# Patient Record
Sex: Male | Born: 1940 | Hispanic: No | Marital: Married | Smoking: Former smoker
Health system: Southern US, Community
[De-identification: ages and names within clinical notes are randomized; demographics above are authoritative.]

## PROBLEM LIST (undated history)

## (undated) DIAGNOSIS — I10 Essential (primary) hypertension: Secondary | ICD-10-CM

## (undated) DIAGNOSIS — I2510 Atherosclerotic heart disease of native coronary artery without angina pectoris: Secondary | ICD-10-CM

## (undated) DIAGNOSIS — D696 Thrombocytopenia, unspecified: Secondary | ICD-10-CM

## (undated) DIAGNOSIS — D649 Anemia, unspecified: Secondary | ICD-10-CM

## (undated) DIAGNOSIS — G709 Myoneural disorder, unspecified: Secondary | ICD-10-CM

## (undated) DIAGNOSIS — K851 Biliary acute pancreatitis without necrosis or infection: Secondary | ICD-10-CM

## (undated) DIAGNOSIS — R1310 Dysphagia, unspecified: Secondary | ICD-10-CM

## (undated) DIAGNOSIS — I639 Cerebral infarction, unspecified: Secondary | ICD-10-CM

## (undated) DIAGNOSIS — E785 Hyperlipidemia, unspecified: Secondary | ICD-10-CM

## (undated) DIAGNOSIS — N183 Chronic kidney disease, stage 3 (moderate): Secondary | ICD-10-CM

## (undated) DIAGNOSIS — Z8673 Personal history of transient ischemic attack (TIA), and cerebral infarction without residual deficits: Secondary | ICD-10-CM

## (undated) DIAGNOSIS — I2584 Coronary atherosclerosis due to calcified coronary lesion: Secondary | ICD-10-CM

## (undated) DIAGNOSIS — G9389 Other specified disorders of brain: Secondary | ICD-10-CM

## (undated) DIAGNOSIS — K7460 Unspecified cirrhosis of liver: Secondary | ICD-10-CM

## (undated) DIAGNOSIS — K859 Acute pancreatitis without necrosis or infection, unspecified: Secondary | ICD-10-CM

## (undated) DIAGNOSIS — I700 Atherosclerosis of aorta: Secondary | ICD-10-CM

## (undated) DIAGNOSIS — I209 Angina pectoris, unspecified: Secondary | ICD-10-CM

---

## 2002-05-07 ENCOUNTER — Encounter: Admission: RE | Admit: 2002-05-07 | Discharge: 2002-08-05 | Payer: Self-pay | Admitting: Family Medicine

## 2002-08-18 ENCOUNTER — Encounter: Payer: Self-pay | Admitting: Neurosurgery

## 2002-08-18 ENCOUNTER — Encounter: Admission: RE | Admit: 2002-08-18 | Discharge: 2002-08-18 | Payer: Self-pay | Admitting: Neurosurgery

## 2002-09-01 ENCOUNTER — Encounter: Payer: Self-pay | Admitting: Neurosurgery

## 2002-09-01 ENCOUNTER — Encounter: Admission: RE | Admit: 2002-09-01 | Discharge: 2002-09-01 | Payer: Self-pay | Admitting: Neurosurgery

## 2002-10-01 ENCOUNTER — Encounter: Payer: Self-pay | Admitting: Gastroenterology

## 2002-10-01 ENCOUNTER — Ambulatory Visit (HOSPITAL_COMMUNITY): Admission: RE | Admit: 2002-10-01 | Discharge: 2002-10-01 | Payer: Self-pay | Admitting: Gastroenterology

## 2008-04-03 ENCOUNTER — Emergency Department (HOSPITAL_COMMUNITY): Admission: EM | Admit: 2008-04-03 | Discharge: 2008-04-03 | Payer: Self-pay | Admitting: Emergency Medicine

## 2010-11-09 ENCOUNTER — Emergency Department (HOSPITAL_COMMUNITY)
Admission: EM | Admit: 2010-11-09 | Discharge: 2010-11-09 | Payer: Self-pay | Source: Home / Self Care | Admitting: Emergency Medicine

## 2011-02-05 LAB — CBC
HCT: 38.8 % — ABNORMAL LOW (ref 39.0–52.0)
Hemoglobin: 13.4 g/dL (ref 13.0–17.0)
MCH: 30 pg (ref 26.0–34.0)
MCHC: 34.5 g/dL (ref 30.0–36.0)
MCV: 87 fL (ref 78.0–100.0)
Platelets: 149 10*3/uL — ABNORMAL LOW (ref 150–400)
RBC: 4.46 MIL/uL (ref 4.22–5.81)
RDW: 13 % (ref 11.5–15.5)
WBC: 9.8 10*3/uL (ref 4.0–10.5)

## 2011-02-05 LAB — BASIC METABOLIC PANEL
BUN: 21 mg/dL (ref 6–23)
CO2: 27 mEq/L (ref 19–32)
Calcium: 9.4 mg/dL (ref 8.4–10.5)
Chloride: 102 mEq/L (ref 96–112)
Creatinine, Ser: 1.34 mg/dL (ref 0.4–1.5)
GFR calc Af Amer: >60 mL/min (ref >60)
GFR calc non Af Amer: 53 mL/min — ABNORMAL LOW (ref >60)
Glucose, Bld: 198 mg/dL — ABNORMAL HIGH (ref 70–99)
Potassium: 4.1 mEq/L (ref 3.5–5.1)
Sodium: 137 mEq/L (ref 135–145)

## 2011-02-05 LAB — DIFFERENTIAL
Basophils Absolute: 0.1 10*3/uL (ref 0.0–0.1)
Basophils Relative: 1 % (ref 0–1)
Eosinophils Absolute: 0.4 10*3/uL (ref 0.0–0.7)
Monocytes Relative: 9 % (ref 3–12)
Neutro Abs: 5.4 10*3/uL (ref 1.7–7.7)
Neutrophils Relative %: 55 % (ref 43–77)

## 2012-05-21 ENCOUNTER — Inpatient Hospital Stay (HOSPITAL_COMMUNITY)
Admission: EM | Admit: 2012-05-21 | Discharge: 2012-06-01 | DRG: 438 | Disposition: A | Discharge status: Home / Self Care | Payer: Medicare Other | Attending: Family Medicine | Admitting: Family Medicine

## 2012-05-21 ENCOUNTER — Encounter (HOSPITAL_COMMUNITY): Payer: Self-pay | Admitting: Emergency Medicine

## 2012-05-21 DIAGNOSIS — I10 Essential (primary) hypertension: Secondary | ICD-10-CM

## 2012-05-21 DIAGNOSIS — R0989 Other specified symptoms and signs involving the circulatory and respiratory systems: Secondary | ICD-10-CM | POA: Diagnosis present

## 2012-05-21 DIAGNOSIS — K8050 Calculus of bile duct without cholangitis or cholecystitis without obstruction: Secondary | ICD-10-CM

## 2012-05-21 DIAGNOSIS — K7460 Unspecified cirrhosis of liver: Secondary | ICD-10-CM | POA: Diagnosis present

## 2012-05-21 DIAGNOSIS — D649 Anemia, unspecified: Secondary | ICD-10-CM

## 2012-05-21 DIAGNOSIS — B1920 Unspecified viral hepatitis C without hepatic coma: Secondary | ICD-10-CM | POA: Diagnosis present

## 2012-05-21 DIAGNOSIS — E871 Hypo-osmolality and hyponatremia: Secondary | ICD-10-CM | POA: Diagnosis present

## 2012-05-21 DIAGNOSIS — I519 Heart disease, unspecified: Secondary | ICD-10-CM | POA: Diagnosis present

## 2012-05-21 DIAGNOSIS — A419 Sepsis, unspecified organism: Secondary | ICD-10-CM

## 2012-05-21 DIAGNOSIS — D696 Thrombocytopenia, unspecified: Secondary | ICD-10-CM | POA: Diagnosis present

## 2012-05-21 DIAGNOSIS — K859 Acute pancreatitis without necrosis or infection, unspecified: Principal | ICD-10-CM | POA: Diagnosis present

## 2012-05-21 DIAGNOSIS — Z01810 Encounter for preprocedural cardiovascular examination: Secondary | ICD-10-CM

## 2012-05-21 DIAGNOSIS — A4150 Gram-negative sepsis, unspecified: Secondary | ICD-10-CM | POA: Diagnosis present

## 2012-05-21 DIAGNOSIS — N179 Acute kidney failure, unspecified: Secondary | ICD-10-CM | POA: Diagnosis present

## 2012-05-21 DIAGNOSIS — R197 Diarrhea, unspecified: Secondary | ICD-10-CM | POA: Diagnosis present

## 2012-05-21 DIAGNOSIS — E119 Type 2 diabetes mellitus without complications: Secondary | ICD-10-CM | POA: Diagnosis present

## 2012-05-21 DIAGNOSIS — I5189 Other ill-defined heart diseases: Secondary | ICD-10-CM | POA: Diagnosis present

## 2012-05-21 DIAGNOSIS — C220 Liver cell carcinoma: Secondary | ICD-10-CM

## 2012-05-21 DIAGNOSIS — R112 Nausea with vomiting, unspecified: Secondary | ICD-10-CM | POA: Diagnosis present

## 2012-05-21 DIAGNOSIS — K8040 Calculus of bile duct with cholecystitis, unspecified, without obstruction: Secondary | ICD-10-CM | POA: Diagnosis present

## 2012-05-21 HISTORY — DX: Essential (primary) hypertension: I10

## 2012-05-21 HISTORY — DX: Angina pectoris, unspecified: I20.9

## 2012-05-21 HISTORY — DX: Myoneural disorder, unspecified: G70.9

## 2012-05-21 HISTORY — DX: Hyperlipidemia, unspecified: E78.5

## 2012-05-21 HISTORY — DX: Dysphagia, unspecified: R13.10

## 2012-05-21 NOTE — ED Notes (Signed)
QMV:[id number]<NG> Expected date:05/21/12<BR> Expected time:11:28 PM<BR> Means of arrival:Ambulance<BR> Comments:<BR> N/v

## 2012-05-22 ENCOUNTER — Emergency Department (HOSPITAL_COMMUNITY): Payer: Medicare Other

## 2012-05-22 ENCOUNTER — Encounter (HOSPITAL_COMMUNITY)
Admission: EM | Disposition: A | Discharge status: Home / Self Care | Payer: Self-pay | Source: Home / Self Care | Attending: Internal Medicine

## 2012-05-22 ENCOUNTER — Inpatient Hospital Stay (HOSPITAL_COMMUNITY): Payer: Medicare Other

## 2012-05-22 ENCOUNTER — Encounter (HOSPITAL_COMMUNITY): Payer: Self-pay | Admitting: Internal Medicine

## 2012-05-22 DIAGNOSIS — R509 Fever, unspecified: Secondary | ICD-10-CM | POA: Insufficient documentation

## 2012-05-22 DIAGNOSIS — K859 Acute pancreatitis without necrosis or infection, unspecified: Secondary | ICD-10-CM

## 2012-05-22 DIAGNOSIS — E876 Hypokalemia: Secondary | ICD-10-CM

## 2012-05-22 DIAGNOSIS — N179 Acute kidney failure, unspecified: Secondary | ICD-10-CM

## 2012-05-22 DIAGNOSIS — R112 Nausea with vomiting, unspecified: Secondary | ICD-10-CM | POA: Insufficient documentation

## 2012-05-22 DIAGNOSIS — R6883 Chills (without fever): Secondary | ICD-10-CM | POA: Insufficient documentation

## 2012-05-22 DIAGNOSIS — K8001 Calculus of gallbladder with acute cholecystitis with obstruction: Secondary | ICD-10-CM

## 2012-05-22 DIAGNOSIS — I10 Essential (primary) hypertension: Secondary | ICD-10-CM | POA: Diagnosis present

## 2012-05-22 DIAGNOSIS — R197 Diarrhea, unspecified: Secondary | ICD-10-CM | POA: Insufficient documentation

## 2012-05-22 DIAGNOSIS — D696 Thrombocytopenia, unspecified: Secondary | ICD-10-CM | POA: Diagnosis present

## 2012-05-22 DIAGNOSIS — E871 Hypo-osmolality and hyponatremia: Secondary | ICD-10-CM | POA: Diagnosis present

## 2012-05-22 HISTORY — DX: Acute pancreatitis without necrosis or infection, unspecified: K85.90

## 2012-05-22 HISTORY — PX: ERCP: SHX5425

## 2012-05-22 LAB — URINE MICROSCOPIC-ADD ON

## 2012-05-22 LAB — URINALYSIS, ROUTINE W REFLEX MICROSCOPIC
Nitrite: POSITIVE — AB
Protein, ur: 100 mg/dL — AB
Specific Gravity, Urine: 1.019 (ref 1.005–1.030)
Urobilinogen, UA: 1 mg/dL (ref 0.0–1.0)

## 2012-05-22 LAB — COMPREHENSIVE METABOLIC PANEL
ALT: 277 U/L — ABNORMAL HIGH (ref 0–53)
Albumin: 3.3 g/dL — ABNORMAL LOW (ref 3.5–5.2)
Alkaline Phosphatase: 191 U/L — ABNORMAL HIGH (ref 39–117)
Chloride: 97 mEq/L (ref 96–112)
Glucose, Bld: 265 mg/dL — ABNORMAL HIGH (ref 70–99)
Potassium: 4.4 mEq/L (ref 3.5–5.1)
Sodium: 133 mEq/L — ABNORMAL LOW (ref 135–145)
Total Bilirubin: 4.3 mg/dL — ABNORMAL HIGH (ref 0.3–1.2)
Total Protein: 7.7 g/dL (ref 6.0–8.3)

## 2012-05-22 LAB — CBC WITH DIFFERENTIAL/PLATELET
Basophils Relative: 0 % (ref 0–1)
Eosinophils Absolute: 0 10*3/uL (ref 0.0–0.7)
Hemoglobin: 12.6 g/dL — ABNORMAL LOW (ref 13.0–17.0)
Lymphs Abs: 1.1 10*3/uL (ref 0.7–4.0)
MCH: 30.4 pg (ref 26.0–34.0)
Neutro Abs: 3.7 10*3/uL (ref 1.7–7.7)
Neutrophils Relative %: 77 % (ref 43–77)
Platelets: 118 10*3/uL — ABNORMAL LOW (ref 150–400)
RBC: 4.14 MIL/uL — ABNORMAL LOW (ref 4.22–5.81)

## 2012-05-22 LAB — GLUCOSE, CAPILLARY
Glucose-Capillary: 120 mg/dL — ABNORMAL HIGH (ref 70–99)
Glucose-Capillary: 133 mg/dL — ABNORMAL HIGH (ref 70–99)
Glucose-Capillary: 239 mg/dL — ABNORMAL HIGH (ref 70–99)
Glucose-Capillary: 69 mg/dL — ABNORMAL LOW (ref 70–99)

## 2012-05-22 SURGERY — ERCP, WITH INTERVENTION IF INDICATED
Anesthesia: Moderate Sedation

## 2012-05-22 MED ORDER — MIDAZOLAM HCL 10 MG/2ML IJ SOLN
INTRAMUSCULAR | Status: DC | PRN
  Administered 2012-05-22 (×2): 1 mg via INTRAVENOUS
  Administered 2012-05-22: 2 mg via INTRAVENOUS
  Administered 2012-05-22: 1 mg via INTRAVENOUS

## 2012-05-22 MED ORDER — SODIUM CHLORIDE 0.9 % IV SOLN
INTRAVENOUS | Status: DC
  Administered 2012-05-22 – 2012-05-23 (×4): via INTRAVENOUS

## 2012-05-22 MED ORDER — ONDANSETRON HCL 4 MG/2ML IJ SOLN
4.0000 mg | Freq: Once | INTRAMUSCULAR | Status: AC
  Administered 2012-05-22: 4 mg via INTRAVENOUS
  Filled 2012-05-22: qty 2

## 2012-05-22 MED ORDER — INSULIN ASPART 100 UNIT/ML ~~LOC~~ SOLN
0.0000 [IU] | Freq: Three times a day (TID) | SUBCUTANEOUS | Status: DC
  Administered 2012-05-22 – 2012-05-24 (×2): 2 [IU] via SUBCUTANEOUS
  Administered 2012-05-24: 3 [IU] via SUBCUTANEOUS
  Administered 2012-05-25: 5 [IU] via SUBCUTANEOUS
  Administered 2012-05-25: 3 [IU] via SUBCUTANEOUS
  Administered 2012-05-26 – 2012-05-29 (×3): 2 [IU] via SUBCUTANEOUS
  Administered 2012-05-30 (×2): 3 [IU] via SUBCUTANEOUS
  Administered 2012-05-31: 5 [IU] via SUBCUTANEOUS
  Administered 2012-05-31 (×2): 2 [IU] via SUBCUTANEOUS

## 2012-05-22 MED ORDER — INSULIN ASPART 100 UNIT/ML ~~LOC~~ SOLN: 0.0000 [IU] | Freq: Every day | SUBCUTANEOUS | Status: DC

## 2012-05-22 MED ORDER — EPINEPHRINE HCL 0.1 MG/ML IJ SOLN
INTRAMUSCULAR | Status: AC
  Filled 2012-05-22: qty 10

## 2012-05-22 MED ORDER — ACETAMINOPHEN 325 MG PO TABS
650.0000 mg | ORAL_TABLET | Freq: Once | ORAL | Status: AC
  Administered 2012-05-22: 650 mg via ORAL
  Filled 2012-05-22: qty 2

## 2012-05-22 MED ORDER — FENTANYL CITRATE 0.05 MG/ML IJ SOLN
50.0000 ug | Freq: Once | INTRAMUSCULAR | Status: AC
  Administered 2012-05-22: 50 ug via INTRAVENOUS
  Filled 2012-05-22: qty 2

## 2012-05-22 MED ORDER — MORPHINE SULFATE 2 MG/ML IJ SOLN
1.0000 mg | INTRAMUSCULAR | Status: DC | PRN
  Administered 2012-05-22 – 2012-05-26 (×2): 1 mg via INTRAVENOUS
  Filled 2012-05-22 (×2): qty 1

## 2012-05-22 MED ORDER — ERTAPENEM SODIUM 1 G IJ SOLR
1.0000 g | INTRAMUSCULAR | Status: DC
  Administered 2012-05-23 – 2012-05-25 (×3): 1 g via INTRAVENOUS
  Filled 2012-05-22 (×3): qty 1

## 2012-05-22 MED ORDER — ONDANSETRON HCL 4 MG/2ML IJ SOLN: 4.0000 mg | Freq: Four times a day (QID) | INTRAMUSCULAR | Status: DC | PRN

## 2012-05-22 MED ORDER — SODIUM CHLORIDE 0.9 % IV SOLN
INTRAVENOUS | Status: DC | PRN
  Administered 2012-05-22: 16:00:00

## 2012-05-22 MED ORDER — ONDANSETRON HCL 4 MG PO TABS: 4.0000 mg | ORAL_TABLET | Freq: Four times a day (QID) | ORAL | Status: DC | PRN

## 2012-05-22 MED ORDER — SODIUM CHLORIDE 0.9 % IJ SOLN
3.0000 mL | Freq: Two times a day (BID) | INTRAMUSCULAR | Status: DC
  Administered 2012-05-24 – 2012-05-28 (×7): 3 mL via INTRAVENOUS

## 2012-05-22 MED ORDER — SODIUM CHLORIDE 0.9 % IV SOLN
1.5000 g | Freq: Once | INTRAVENOUS | Status: AC
  Administered 2012-05-22: 1.5 g via INTRAVENOUS

## 2012-05-22 MED ORDER — SODIUM CHLORIDE 0.9 % IV BOLUS (SEPSIS)
1000.0000 mL | Freq: Once | INTRAVENOUS | Status: AC
  Administered 2012-05-22: 1000 mL via INTRAVENOUS

## 2012-05-22 MED ORDER — HEPARIN SODIUM (PORCINE) 5000 UNIT/ML IJ SOLN
5000.0000 [IU] | Freq: Three times a day (TID) | INTRAMUSCULAR | Status: DC
  Administered 2012-05-22 – 2012-05-23 (×3): 5000 [IU] via SUBCUTANEOUS
  Filled 2012-05-22 (×6): qty 1

## 2012-05-22 MED ORDER — SODIUM CHLORIDE 0.9 % IV SOLN
1.0000 g | Freq: Once | INTRAVENOUS | Status: AC
  Administered 2012-05-22: 1 g via INTRAVENOUS
  Filled 2012-05-22: qty 1

## 2012-05-22 MED ORDER — DIPHENHYDRAMINE HCL 50 MG/ML IJ SOLN
INTRAMUSCULAR | Status: DC | PRN
  Administered 2012-05-22: 12.5 mg via INTRAVENOUS

## 2012-05-22 MED ORDER — GLUCAGON HCL (RDNA) 1 MG IJ SOLR
INTRAMUSCULAR | Status: DC | PRN
  Administered 2012-05-22: .5 mL via INTRAVENOUS

## 2012-05-22 MED ORDER — FENTANYL CITRATE 0.05 MG/ML IJ SOLN
100.0000 ug | Freq: Once | INTRAMUSCULAR | Status: AC
  Administered 2012-05-22: 100 ug via INTRAVENOUS
  Filled 2012-05-22: qty 2

## 2012-05-22 MED ORDER — FENTANYL CITRATE 0.05 MG/ML IJ SOLN
INTRAMUSCULAR | Status: DC | PRN
  Administered 2012-05-22: 12.5 ug via INTRAVENOUS
  Administered 2012-05-22: 25 ug via INTRAVENOUS

## 2012-05-22 MED ORDER — SODIUM CHLORIDE 0.9 % IJ SOLN
INTRAMUSCULAR | Status: DC | PRN
  Administered 2012-05-22: 16:00:00

## 2012-05-22 NOTE — ED Notes (Signed)
2W called 1x for report. RN will call back

## 2012-05-22 NOTE — ED Notes (Signed)
Pt [employer], non-English speaking. Language barrier addressed

## 2012-05-22 NOTE — ED Provider Notes (Signed)
History     CSN: [id number]  Arrival date & time 05/21/12  2341   First MD Initiated Contact with Patient 05/22/12 0109      Chief Complaint  Patient presents with  . Chills    (Consider location/radiation/quality/duration/timing/severity/associated sxs/prior treatment) HPI This is a 71 year old [place] male with a history of nausea, vomiting and diarrhea 2 days ago. Those symptoms resolved yesterday but he continues to have epigastric and suprapubic pain, moderate in severity. His specific reason for being here this morning is that he has been having chills which he describes as shaking in his bones. He is also having burning with urination and complaining of dark urine. His abdominal pain is worse with palpation or movement. He was noted to be febrile here with a temperature of 102.4. He denies chest pain, dyspnea or nausea at the present time.  Past Medical History  Diagnosis Date  . Diabetes mellitus     History reviewed. No pertinent past surgical history.  History reviewed. No pertinent family history.  History  Substance Use Topics  . Smoking status: Former Games developer  . Smokeless tobacco: Not on file  . Alcohol Use: No      Review of Systems  All other systems reviewed and are negative.    Allergies  Review of patient's allergies indicates no known allergies.  Home Medications  No current outpatient prescriptions on file.  BP 99/54  Pulse 93  Temp 99 F (37.2 C) (Oral)  Resp 18  SpO2 95%  Physical Exam General: Well-developed, well-nourished male in no acute distress; appearance consistent with age of record HENT: normocephalic, atraumatic Eyes: pupils equal round and reactive to light; extraocular muscles intact; arcus senilis bilaterally Neck: supple Heart: regular rate and rhythm; tachycardia Lungs: clear to auscultation bilaterally Abdomen: soft; nondistended; epigastric and suprapubic tenderness; no masses or hepatosplenomegaly; bowel sounds  present GU: No flank tenderness Extremities: No deformity; full range of motion; pulses normal Neurologic: Awake, alert; motor function intact in all extremities and symmetric; no facial droop Skin: Warm and dry Psychiatric: Normal mood and affect    ED Course  Procedures (including critical care time)     MDM   Nursing notes and vitals signs, including pulse oximetry, reviewed.  Summary of this visit's results, reviewed by myself:  Labs:  Results for orders placed during the hospital encounter of 05/21/12  GLUCOSE, CAPILLARY      Component Value Range   Glucose-Capillary 239 (*) 70 - 99 mg/dL   Comment 1 Documented in Chart     Comment 2 Notify RN    URINALYSIS, ROUTINE W REFLEX MICROSCOPIC      Component Value Range   Color, Urine ORANGE (*) YELLOW   APPearance CLOUDY (*) CLEAR   Specific Gravity, Urine 1.019  1.005 - 1.030   pH 5.0  5.0 - 8.0   Glucose, UA 250 (*) NEGATIVE mg/dL   Hgb urine dipstick TRACE (*) NEGATIVE   Bilirubin Urine MODERATE (*) NEGATIVE   Ketones, ur TRACE (*) NEGATIVE mg/dL   Protein, ur 147 (*) NEGATIVE mg/dL   Urobilinogen, UA 1.0  0.0 - 1.0 mg/dL   Nitrite POSITIVE (*) NEGATIVE   Leukocytes, UA SMALL (*) NEGATIVE  CBC WITH DIFFERENTIAL      Component Value Range   WBC 4.8  4.0 - 10.5 K/uL   RBC 4.14 (*) 4.22 - 5.81 MIL/uL   Hemoglobin 12.6 (*) 13.0 - 17.0 g/dL   HCT 82.9 (*) 56.2 - 13.0 %   MCV  87.2  78.0 - 100.0 fL   MCH 30.4  26.0 - 34.0 pg   MCHC 34.9  30.0 - 36.0 g/dL   RDW 16.1  09.6 - 04.5 %   Platelets 118 (*) 150 - 400 K/uL   Neutrophils Relative 77  43 - 77 %   Neutro Abs 3.7  1.7 - 7.7 K/uL   Lymphocytes Relative 22  12 - 46 %   Lymphs Abs 1.1  0.7 - 4.0 K/uL   Monocytes Relative 1 (*) 3 - 12 %   Monocytes Absolute 0.1  0.1 - 1.0 K/uL   Eosinophils Relative 0  0 - 5 %   Eosinophils Absolute 0.0  0.0 - 0.7 K/uL   Basophils Relative 0  0 - 1 %   Basophils Absolute 0.0  0.0 - 0.1 K/uL  COMPREHENSIVE METABOLIC PANEL       Component Value Range   Sodium 133 (*) 135 - 145 mEq/L   Potassium 4.4  3.5 - 5.1 mEq/L   Chloride 97  96 - 112 mEq/L   CO2 16 (*) 19 - 32 mEq/L   Glucose, Bld 265 (*) 70 - 99 mg/dL   BUN 41 (*) 6 - 23 mg/dL   Creatinine, Ser 4.09 (*) 0.50 - 1.35 mg/dL   Calcium 9.0  8.4 - 81.1 mg/dL   Total Protein 7.7  6.0 - 8.3 g/dL   Albumin 3.3 (*) 3.5 - 5.2 g/dL   AST 914 (*) 0 - 37 U/L   ALT 277 (*) 0 - 53 U/L   Alkaline Phosphatase 191 (*) 39 - 117 U/L   Total Bilirubin 4.3 (*) 0.3 - 1.2 mg/dL   GFR calc non Af Amer 19 (*) >90 mL/min   GFR calc Af Amer 23 (*) >90 mL/min  LIPASE, BLOOD      Component Value Range   Lipase >3000 (*) 11 - 59 U/L  URINE MICROSCOPIC-ADD ON      Component Value Range   Squamous Epithelial / LPF FEW (*) RARE   WBC, UA 3-6  <3 WBC/hpf   Bacteria, UA MANY (*) RARE   Casts GRANULAR CAST (*) NEGATIVE    Imaging Studies: Ct Abdomen Pelvis Wo Contrast  05/22/2012  *RADIOLOGY REPORT*  Clinical Data: Nausea, vomiting, diarrhea, body aches and chills. Elevated lipase.  CT ABDOMEN AND PELVIS WITHOUT CONTRAST  Technique:  Multidetector CT imaging of the abdomen and pelvis was performed following the standard protocol without intravenous contrast.  Comparison: None.  Findings: Atelectasis or infiltration in the lung bases.  Cardiac enlargement.  Coronary artery calcification.  The unenhanced appearance of the liver, spleen, adrenal glands, kidneys, and retroperitoneal lymph nodes is unremarkable.  Stones in the gallbladder without obvious gallbladder wall thickening or inflammatory change.  No significant bile duct dilatation. Prominence of the ampulla which could represent mass or inflammation.  There is a high density in the ampulla consistent with a stone in the distal common bile duct.  No significant pancreatic ductal dilatation.  Mild infiltration in the peripancreatic fat vertically around the tail which might represent changes of pancreatitis.  No free fluid or free air in  the abdomen. Calcification of the abdominal aorta without aneurysm.  The stomach, small bowel, and colon are not abnormally distended.  Pelvis:  The prostate gland is mildly enlarged, measuring 4.6 x 3.4 cm.  Bladder wall is not thickened.  No free or loculated pelvic fluid collections.  No diverticulitis.  The appendix is normal.  No significant lymphadenopathy  in the pelvis.  Degenerative changes in the lumbar spine.  IMPRESSION: Cholelithiasis with apparent stone in the distal common bile duct. No significant bile duct dilatation.  There is prominent soft tissue in the anterior region which might be due to inflammation or mass.  Mild infiltration around the pancreas suggesting mild pancreatitis.  Original Report Authenticated By: Marlon Pel, M.D.   Dg Chest 2 View  05/22/2012  *RADIOLOGY REPORT*  Clinical Data: Fever and nausea.  CHEST - 2 VIEW  Comparison: None.  Findings: Shallow inspiration.  Linear atelectasis or infiltration in the lung bases.  Normal heart size and pulmonary vascularity. No blunting of costophrenic angles.  No pneumothorax. Calcification of the aorta.  The mild degenerative changes in the spine.  IMPRESSION: Shallow inspiration.  Linear atelectasis or infiltration in the lung bases.  Original Report Authenticated By: Marlon Pel, M.D.   6:08 AM Pincus Sanes 1g IV ordered. [facility] hospitalist to admit.        Hanley Seamen, MD 05/22/12 [phone]

## 2012-05-22 NOTE — Progress Notes (Addendum)
2120: Pt called for help up to bathroom.  I unplugged IV pump assisted pt to bathroom. Pt does not speak English son at bedside. Pt standing in bathroom with door open I was standing in room talking with son and heard a loud noise.  Pt on floor in a sitting position with urine all over floor. VS 182/75 resp 24 pulse 91 O2 95% on RA. Pt denies any pain and no visible injury noted by me.  Pt assisted back to bed with assistance of coworkers.   2200 Mariea Stable NP notified of above information & Pt's CBC 69 will recheck.

## 2012-05-22 NOTE — OR Nursing (Signed)
When admitting pt to endoscopy for ERCP procedure, family member Wynema Birch [employer]ner was interpreting for patient and RN.  Hao Debarge stated that pt's name is Christian Wallace; name in computer is Terwilliger Elby.  Charge RN Delorse Lek called admitting who stated they would be changing the name in the computer.  We have continued to use current chart until that is completed.  Will alert RN on floor that pt's name is incorrect in computer and to follow up with change.  Angelique Blonder, RN

## 2012-05-22 NOTE — Op Note (Signed)
[facility] [address] [place], [place]  [address]  ERCP PROCEDURE REPORT  PATIENT:  Christian Wallace, Christian Wallace  MR#:  [id number] BIRTHDATE:  1941-04-16  GENDER:  male ENDOSCOPIST:  Jeani Hawking, MD ASSISTANT:  Rayfield Citizen, RN PROCEDURE DATE:  05/22/2012 PROCEDURE:  ERCP with removal of stones ASA CLASS:  Class III INDICATIONS:  stone  MEDICATIONS:   Fentanyl 37.5 mcg IV, Versed 5 mg IV, Benadryl 12.5 mg IV, glucagon 0.5 mg IV TOPICAL ANESTHETIC:  Cetacaine Spray  DESCRIPTION OF PROCEDURE:   After the risks benefits and alternatives of the procedure were thoroughly explained, informed consent was obtained.  The Pentax ERCP [id number]-[id number] endoscope was introduced through the mouth and advanced to the second portion of the duodenum.  FINDINGS:  The CBD had to be cannulated in the long position. The PD was cannulated twice, but no contrast injection was performed. Contrast injection into the CBD revealed a few filling defects in the distal and mid CBD. The duct was not significantly dilated. An 8 mm sphincterotomy was created. Attempts to extend the sphincterotomy failed because of the long position. The CBD was swept 4-5 times and debris was removed as well as a stone. The final occlussion cholangiogram was negative for any retained stones. Cholelithiasis was identified. Some oozing was noted at the sphincterotomy site and a total of 2.5 ml of 1:10,000 Epi was injected, which controlled the bleeding.    The scope was then completely withdrawn from the patient and the procedure terminated. <<PROCEDUREIMAGES>>  COMPLICATIONS:  None  ENDOSCOPIC IMPRESSION: 1) CBD Stone. RECOMMENDATIONS: 1) Surgical consultation for cholelithiasis. 2) Continue with supportive care.  ______________________________ Jeani Hawking, MD  n. Rosalie DoctorJeani Hawking at 05/22/2012 03:55 PM  [place], [place], [phone]

## 2012-05-22 NOTE — ED Notes (Signed)
Pt. Walked to restroom. Need of one staff member to assist.  Unsteady gait.

## 2012-05-22 NOTE — ED Notes (Signed)
Dr. Read Drivers in room to assess patient via interpreter and address concerns. Pt in no distress at this time.

## 2012-05-22 NOTE — ED Notes (Signed)
Pt resting in bed no needs at this time.

## 2012-05-22 NOTE — Progress Notes (Signed)
CBG now 120 after juice.

## 2012-05-22 NOTE — ED Notes (Signed)
Pt resting with eyes closed; no s/s of distress noted. Pt's son remains at bedside.

## 2012-05-22 NOTE — H&P (Signed)
Patient's PCP: No primary provider on file.  Chief Complaint: chills  History of Present Illness: Christian Wallace is a 71 y.o. [place]  male brought in by family who helped with interpreting.  He started with nausea, vomiting and diarrhea 2 days ago. Those symptoms resolved yesterday but he continues to have epigastric and suprapubic pain, moderate in severity. His specific reason for being here this morning is that he has been having chills which he describes as "shaking in his bones". He is also having burning with urination and complaining of dark urine. His abdominal pain is worse with palpation or movement. He was noted to be febrile here with a temperature of 102.4. He denies chest pain, dyspnea or nausea at the present time.  Initially patient was hypotensive in the ER but BP has improved with IVF and antibiotics.     Meds: Scheduled Meds:    . acetaminophen  650 mg Oral Once  . ertapenem  1 g Intravenous Once  . fentaNYL  100 mcg Intravenous Once  . fentaNYL  50 mcg Intravenous Once  . ondansetron (ZOFRAN) IV  4 mg Intravenous Once  . sodium chloride  1,000 mL Intravenous Once   Continuous Infusions:  PRN Meds:. Allergies: Review of patient's allergies indicates no known allergies. Past Medical History  Diagnosis Date  . Diabetes mellitus   . HTN (hypertension)   . HLD (hyperlipidemia)    History reviewed. No pertinent past surgical history. History reviewed. No pertinent family history. History   Social History  . Marital Status: Married    Spouse Name: N/A    Number of Children: N/A  . Years of Education: N/A   Occupational History  . Not on file.   Social History Main Topics  . Smoking status: Former Games developer  . Smokeless tobacco: Not on file  . Alcohol Use: No  . Drug Use: No  . Sexually Active:    Other Topics Concern  . Not on file   Social History Narrative  . No narrative on file   Review of Systems: All systems reviewed with the patient and positive  as per history of present illness, otherwise all other systems are negative.   Physical Exam: Blood pressure 121/57, pulse 80, temperature 97.7 F (36.5 C), temperature source Oral, resp. rate 18, SpO2 100.00%. General: Awake, Oriented x3, No acute distress. HEENT: EOMI, dry mucous membranes Neck: Supple CV: S1 and S2 Lungs: Clear to ascultation bilaterally, no wheezing Abdomen: Soft, + tenderness RUQ and epigastric,  Mildly distended, +bowel sounds. Ext: Good pulses. Trace edema. No clubbing or cyanosis noted. Neuro: Cranial Nerves II-XII grossly intact. Has 5/5 motor strength in upper and lower extremities.    Lab results:  [facility] 05/22/12 0010  NA 133*  K 4.4  CL 97  CO2 16*  GLUCOSE 265*  BUN 41*  CREATININE 2.97*  CALCIUM 9.0  MG --  PHOS --    Basename 05/22/12 0010  AST 224*  ALT 277*  ALKPHOS 191*  BILITOT 4.3*  PROT 7.7  ALBUMIN 3.3*    Basename 05/22/12 0010  LIPASE >3000*  AMYLASE --    Basename 05/22/12 0010  WBC 4.8  NEUTROABS 3.7  HGB 12.6*  HCT 36.1*  MCV 87.2  PLT 118*   No results found for this basename: CKTOTAL:3,CKMB:3,CKMBINDEX:3,TROPONINI:3 in the last 72 hours No components found with this basename: POCBNP:3 No results found for this basename: DDIMER in the last 72 hours No results found for this basename: HGBA1C:2 in the last  72 hours No results found for this basename: CHOL:2,HDL:2,LDLCALC:2,TRIG:2,CHOLHDL:2,LDLDIRECT:2 in the last 72 hours No results found for this basename: TSH,T4TOTAL,FREET3,T3FREE,THYROIDAB in the last 72 hours No results found for this basename: VITAMINB12:2,FOLATE:2,FERRITIN:2,TIBC:2,IRON:2,RETICCTPCT:2 in the last 72 hours Imaging results:  Ct Abdomen Pelvis Wo Contrast  05/22/2012  *RADIOLOGY REPORT*  Clinical Data: Nausea, vomiting, diarrhea, body aches and chills. Elevated lipase.  CT ABDOMEN AND PELVIS WITHOUT CONTRAST  Technique:  Multidetector CT imaging of the abdomen and pelvis was performed  following the standard protocol without intravenous contrast.  Comparison: None.  Findings: Atelectasis or infiltration in the lung bases.  Cardiac enlargement.  Coronary artery calcification.  The unenhanced appearance of the liver, spleen, adrenal glands, kidneys, and retroperitoneal lymph nodes is unremarkable.  Stones in the gallbladder without obvious gallbladder wall thickening or inflammatory change.  No significant bile duct dilatation. Prominence of the ampulla which could represent mass or inflammation.  There is a high density in the ampulla consistent with a stone in the distal common bile duct.  No significant pancreatic ductal dilatation.  Mild infiltration in the peripancreatic fat vertically around the tail which might represent changes of pancreatitis.  No free fluid or free air in the abdomen. Calcification of the abdominal aorta without aneurysm.  The stomach, small bowel, and colon are not abnormally distended.  Pelvis:  The prostate gland is mildly enlarged, measuring 4.6 x 3.4 cm.  Bladder wall is not thickened.  No free or loculated pelvic fluid collections.  No diverticulitis.  The appendix is normal.  No significant lymphadenopathy in the pelvis.  Degenerative changes in the lumbar spine.  IMPRESSION: Cholelithiasis with apparent stone in the distal common bile duct. No significant bile duct dilatation.  There is prominent soft tissue in the anterior region which might be due to inflammation or mass.  Mild infiltration around the pancreas suggesting mild pancreatitis.  Original Report Authenticated By: Marlon Pel, M.D.   Dg Chest 2 View  05/22/2012  *RADIOLOGY REPORT*  Clinical Data: Fever and nausea.  CHEST - 2 VIEW  Comparison: None.  Findings: Shallow inspiration.  Linear atelectasis or infiltration in the lung bases.  Normal heart size and pulmonary vascularity. No blunting of costophrenic angles.  No pneumothorax. Calcification of the aorta.  The mild degenerative changes in  the spine.  IMPRESSION: Shallow inspiration.  Linear atelectasis or infiltration in the lung bases.  Original Report Authenticated By: Marlon Pel, M.D.    Assessment & Plan by Problem: CBS stone- consulted GI for ERCP, NPO, hold diabetic medications  Nausea & vomiting- resolved  Diarrhea- resolved  Hyponatremia- IVF  Pancreatitis- lipase > 3000, abx, hope for resolution after ERCP  AKI (acute kidney injury)- IVF and trend, hold lisinopril DM- SSI, hold metformin  Fever- abx  Chills- abx  HTN (hypertension)- hole hctz/lsinopril  Thrombocytopenia- ? related to infection, monitor  Change admission from step down to med surg as vital signs have improved  Time spent on admission, talking to the patient, and coordinating care was: 45 mins.  Stepen Prins, DO 05/22/2012, 9:22 AM

## 2012-05-22 NOTE — Progress Notes (Signed)
Lab called to notify us pt has  Positive Blood Cultures drawn today  Both Aerobic and Anaerobic Cultures are showing Gram Negative Rods  Lenny Pastel, NP notified of above information Taneal Sonntag [place] 10:15 PM

## 2012-05-22 NOTE — ED Notes (Signed)
Pt with c/o nausea and vomiting since last pm. Pt states diarrhea last pm, but none today. Pt also c/o aches in his bones and chills. Son at bedside.

## 2012-05-22 NOTE — Consult Note (Signed)
Reason for Consult:Gallstone pancreatitis Referring Physician: [facility]  Younce Jaziah HPI: This is a 71 year old patient with an acute onset of epigastric pain yesterday.  He denied having this type of pain in the past.  Since the pain was Rowen severe he presented to the ER and further evaluation revealed that he had pancreatitis, cholelithiasis, and choledocholithiasis.  One day before he did have some issues with nausea and vomiting, but no abdominal pain.  Past Medical History  Diagnosis Date  . Diabetes mellitus   . HTN (hypertension)   . HLD (hyperlipidemia)   . Anginal pain   . Dysphagia   . Neuromuscular disorder     muscle pain in arms    Past Surgical History  Procedure Date  . No past surgeries     History reviewed. No pertinent family history.  Social History:  reports that he has quit smoking. His smoking use included Cigarettes. He has never used smokeless tobacco. He reports that he drinks alcohol. He reports that he does not use illicit drugs.  Allergies: No Known Allergies  Medications:  Scheduled:   . acetaminophen  650 mg Oral Once  . ampicillin-sulbactam (UNASYN) 1.5 g IVPB  1.5 g Intravenous Once  . ertapenem  1 g Intravenous Once  . ertapenem  1 g Intravenous Q24H  . fentaNYL  100 mcg Intravenous Once  . fentaNYL  50 mcg Intravenous Once  . heparin  5,000 Units Subcutaneous Q8H  . insulin aspart  0-15 Units Subcutaneous TID WC  . insulin aspart  0-5 Units Subcutaneous QHS  . ondansetron (ZOFRAN) IV  4 mg Intravenous Once  . sodium chloride  1,000 mL Intravenous Once  . sodium chloride  3 mL Intravenous Q12H   Continuous:   . sodium chloride 125 mL/hr at 05/22/12 1239    Results for orders placed during the hospital encounter of 05/21/12 (from the past 24 hour(s))  GLUCOSE, CAPILLARY     Status: Abnormal   Collection Time   05/22/12 12:09 AM      Component Value Range   Glucose-Capillary 239 (*) 70 - 99 mg/dL   Comment 1 Documented in Chart      Comment 2 Notify RN    CBC WITH DIFFERENTIAL     Status: Abnormal   Collection Time   05/22/12 12:10 AM      Component Value Range   WBC 4.8  4.0 - 10.5 K/uL   RBC 4.14 (*) 4.22 - 5.81 MIL/uL   Hemoglobin 12.6 (*) 13.0 - 17.0 g/dL   HCT 65.7 (*) 84.6 - 96.2 %   MCV 87.2  78.0 - 100.0 fL   MCH 30.4  26.0 - 34.0 pg   MCHC 34.9  30.0 - 36.0 g/dL   RDW 95.2  84.1 - 32.4 %   Platelets 118 (*) 150 - 400 K/uL   Neutrophils Relative 77  43 - 77 %   Neutro Abs 3.7  1.7 - 7.7 K/uL   Lymphocytes Relative 22  12 - 46 %   Lymphs Abs 1.1  0.7 - 4.0 K/uL   Monocytes Relative 1 (*) 3 - 12 %   Monocytes Absolute 0.1  0.1 - 1.0 K/uL   Eosinophils Relative 0  0 - 5 %   Eosinophils Absolute 0.0  0.0 - 0.7 K/uL   Basophils Relative 0  0 - 1 %   Basophils Absolute 0.0  0.0 - 0.1 K/uL  COMPREHENSIVE METABOLIC PANEL     Status: Abnormal  Collection Time   05/22/12 12:10 AM      Component Value Range   Sodium 133 (*) 135 - 145 mEq/L   Potassium 4.4  3.5 - 5.1 mEq/L   Chloride 97  96 - 112 mEq/L   CO2 16 (*) 19 - 32 mEq/L   Glucose, Bld 265 (*) 70 - 99 mg/dL   BUN 41 (*) 6 - 23 mg/dL   Creatinine, Ser 9.14 (*) 0.50 - 1.35 mg/dL   Calcium 9.0  8.4 - 78.2 mg/dL   Total Protein 7.7  6.0 - 8.3 g/dL   Albumin 3.3 (*) 3.5 - 5.2 g/dL   AST 956 (*) 0 - 37 U/L   ALT 277 (*) 0 - 53 U/L   Alkaline Phosphatase 191 (*) 39 - 117 U/L   Total Bilirubin 4.3 (*) 0.3 - 1.2 mg/dL   GFR calc non Af Amer 19 (*) >90 mL/min   GFR calc Af Amer 23 (*) >90 mL/min  LIPASE, BLOOD     Status: Abnormal   Collection Time   05/22/12 12:10 AM      Component Value Range   Lipase >3000 (*) 11 - 59 U/L  URINALYSIS, ROUTINE W REFLEX MICROSCOPIC     Status: Abnormal   Collection Time   05/22/12  1:31 AM      Component Value Range   Color, Urine ORANGE (*) YELLOW   APPearance CLOUDY (*) CLEAR   Specific Gravity, Urine 1.019  1.005 - 1.030   pH 5.0  5.0 - 8.0   Glucose, UA 250 (*) NEGATIVE mg/dL   Hgb urine dipstick TRACE  (*) NEGATIVE   Bilirubin Urine MODERATE (*) NEGATIVE   Ketones, ur TRACE (*) NEGATIVE mg/dL   Protein, ur 213 (*) NEGATIVE mg/dL   Urobilinogen, UA 1.0  0.0 - 1.0 mg/dL   Nitrite POSITIVE (*) NEGATIVE   Leukocytes, UA SMALL (*) NEGATIVE  URINE MICROSCOPIC-ADD ON     Status: Abnormal   Collection Time   05/22/12  1:31 AM      Component Value Range   Squamous Epithelial / LPF FEW (*) RARE   WBC, UA 3-6  <3 WBC/hpf   Bacteria, UA MANY (*) RARE   Casts GRANULAR CAST (*) NEGATIVE  GLUCOSE, CAPILLARY     Status: Abnormal   Collection Time   05/22/12  2:28 PM      Component Value Range   Glucose-Capillary 133 (*) 70 - 99 mg/dL     Ct Abdomen Pelvis Wo Contrast  05/22/2012  *RADIOLOGY REPORT*  Clinical Data: Nausea, vomiting, diarrhea, body aches and chills. Elevated lipase.  CT ABDOMEN AND PELVIS WITHOUT CONTRAST  Technique:  Multidetector CT imaging of the abdomen and pelvis was performed following the standard protocol without intravenous contrast.  Comparison: None.  Findings: Atelectasis or infiltration in the lung bases.  Cardiac enlargement.  Coronary artery calcification.  The unenhanced appearance of the liver, spleen, adrenal glands, kidneys, and retroperitoneal lymph nodes is unremarkable.  Stones in the gallbladder without obvious gallbladder wall thickening or inflammatory change.  No significant bile duct dilatation. Prominence of the ampulla which could represent mass or inflammation.  There is a high density in the ampulla consistent with a stone in the distal common bile duct.  No significant pancreatic ductal dilatation.  Mild infiltration in the peripancreatic fat vertically around the tail which might represent changes of pancreatitis.  No free fluid or free air in the abdomen. Calcification of the abdominal aorta without aneurysm.  The  stomach, small bowel, and colon are not abnormally distended.  Pelvis:  The prostate gland is mildly enlarged, measuring 4.6 x 3.4 cm.  Bladder wall  is not thickened.  No free or loculated pelvic fluid collections.  No diverticulitis.  The appendix is normal.  No significant lymphadenopathy in the pelvis.  Degenerative changes in the lumbar spine.  IMPRESSION: Cholelithiasis with apparent stone in the distal common bile duct. No significant bile duct dilatation.  There is prominent soft tissue in the anterior region which might be due to inflammation or mass.  Mild infiltration around the pancreas suggesting mild pancreatitis.  Original Report Authenticated By: Marlon Pel, M.D.   Dg Chest 2 View  05/22/2012  *RADIOLOGY REPORT*  Clinical Data: Fever and nausea.  CHEST - 2 VIEW  Comparison: None.  Findings: Shallow inspiration.  Linear atelectasis or infiltration in the lung bases.  Normal heart size and pulmonary vascularity. No blunting of costophrenic angles.  No pneumothorax. Calcification of the aorta.  The mild degenerative changes in the spine.  IMPRESSION: Shallow inspiration.  Linear atelectasis or infiltration in the lung bases.  Original Report Authenticated By: Marlon Pel, M.D.    ROS:  As stated above in the HPI otherwise negative.  Blood pressure 120/71, pulse 93, temperature 98.6 F (37 C), temperature source Oral, resp. rate 18, height 5\' 6"  (1.676 m), weight 54.432 kg (120 lb), SpO2 95.00%.    PE: Gen: NAD, Alert and Oriented HEENT:  [place]/AT, EOMI Neck: Supple, no LAD Lungs: CTA Bilaterally CV: RRR without M/G/R ABM: Soft, tender in the epigastrium, +BS Ext: No C/C/E  Assessment/Plan: 1) Gallstone pancreatitis. 2) Cholelithiasis.  Plan: 1) ERCP with stone extraction.  I discussed the risks of bleeding, infection, perforation, and pancreatitis with the patient and his family.  Macdonald Rigor D 05/22/2012, 3:04 PM

## 2012-05-23 ENCOUNTER — Encounter (HOSPITAL_COMMUNITY): Payer: Self-pay | Admitting: Gastroenterology

## 2012-05-23 ENCOUNTER — Inpatient Hospital Stay (HOSPITAL_COMMUNITY): Payer: Medicare Other

## 2012-05-23 DIAGNOSIS — E876 Hypokalemia: Secondary | ICD-10-CM

## 2012-05-23 DIAGNOSIS — K859 Acute pancreatitis without necrosis or infection, unspecified: Secondary | ICD-10-CM

## 2012-05-23 DIAGNOSIS — K8001 Calculus of gallbladder with acute cholecystitis with obstruction: Secondary | ICD-10-CM

## 2012-05-23 DIAGNOSIS — N179 Acute kidney failure, unspecified: Secondary | ICD-10-CM

## 2012-05-23 DIAGNOSIS — R112 Nausea with vomiting, unspecified: Secondary | ICD-10-CM

## 2012-05-23 LAB — COMPREHENSIVE METABOLIC PANEL
Albumin: 2.4 g/dL — ABNORMAL LOW (ref 3.5–5.2)
Alkaline Phosphatase: 121 U/L — ABNORMAL HIGH (ref 39–117)
BUN: 33 mg/dL — ABNORMAL HIGH (ref 6–23)
CO2: 17 mEq/L — ABNORMAL LOW (ref 19–32)
Chloride: 108 mEq/L (ref 96–112)
Creatinine, Ser: 1.69 mg/dL — ABNORMAL HIGH (ref 0.50–1.35)
GFR calc Af Amer: 45 mL/min — ABNORMAL LOW (ref >90)
GFR calc non Af Amer: 38 mL/min — ABNORMAL LOW (ref >90)
Glucose, Bld: 86 mg/dL (ref 70–99)
Total Bilirubin: 2.9 mg/dL — ABNORMAL HIGH (ref 0.3–1.2)

## 2012-05-23 LAB — GLUCOSE, CAPILLARY: Glucose-Capillary: 112 mg/dL — ABNORMAL HIGH (ref 70–99)

## 2012-05-23 LAB — LIPASE, BLOOD: Lipase: 475 U/L — ABNORMAL HIGH (ref 11–59)

## 2012-05-23 LAB — CBC
Platelets: 62 10*3/uL — ABNORMAL LOW (ref 150–400)
RBC: 3.38 MIL/uL — ABNORMAL LOW (ref 4.22–5.81)
RDW: 13.9 % (ref 11.5–15.5)
WBC: 8.5 10*3/uL (ref 4.0–10.5)

## 2012-05-23 MED ORDER — LISINOPRIL 20 MG PO TABS
20.0000 mg | ORAL_TABLET | Freq: Every day | ORAL | Status: DC
  Administered 2012-05-23: 20 mg via ORAL
  Filled 2012-05-23 (×2): qty 1

## 2012-05-23 NOTE — Plan of Care (Signed)
Problem: Phase III Progression Outcomes Goal: Pain controlled on oral analgesia Outcome: Completed/Met Date Met:  05/23/12 Patient denies pain

## 2012-05-23 NOTE — Progress Notes (Addendum)
[facility] PROGRESS NOTE  Marquize Brouse EAV:[id number] DOB: 07-18-1941 DOA: 05/21/2012 PCP: No primary provider on file.  Assessment/Plan: 1. CBS stone- s/p ERCP, clears- advance per GI, feeling better, tolerating clears  2. Gram negative rods in blood cultures- IV abx   3. Nausea & vomiting- resolved   4. Diarrhea- resolved   5. Hyponatremia- IVF   6. Pancreatitis- lipase > 3000 on admission, lipase decreased, abdominal pain better   7. 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 DM- SSI, hold metformin and glipizide  9. Fever- abx , seems to be resolving  10. Chills- abx , resolved  11. HTN (hypertension)- restart lisinopril  12. Thrombocytopenia- ? related to infection, monitor- d/c heparin and give SCDs  13. Falls- consult PT  14.  ?liver spots on CT scan- order alpha feta protein, consider MRI of the abdomen without contrast- defer to Dr. Elnoria Howard suggestion of cirrhosis on the noncontrast scan with the possibility of hepatomas. Since he is [place] HBV is a high consideration. I will check him for this issue and I agree with the AFP. Additionally, a noncontrast MRI scan was recommended for further work up. Radiology mentioned that his gallbladder appeared to be "sick", but clinically he is well.   Code Status: full Family Communication: family at bedside Disposition Plan: home possibly with home health at D/C (once abx changed to PO)- lives with wife and 2 kids   [name], D.O.  [facility] Pager [phone]  05/23/2012, 8:08 AM  LOS: 2 days    Consultants:  Elnoria Howard (GI)  Procedures:  ERCP   Subjective: [place] better after ERCP, no more chills, eating breakfast- finished all No fevers Over night was found in floor- no complaint of pain   Objective: Filed Vitals:   05/22/12 1653 05/22/12 2120 05/22/12 2142 05/23/12 0610  BP: 121/77 152/75 159/80 153/77  Pulse: 79 91 91 95  Temp: 98.4 F (36.9 C)  99.3 F  (37.4 C) 99.7 F (37.6 C)  TempSrc: Oral  Oral Oral  Resp: 18 24 20 18   Height:      Weight:      SpO2: 98% 95% 94% 95%    Intake/Output Summary (Last 24 hours) at 05/23/12 0808 Last data filed at 05/23/12 1308  Gross per 24 hour  Intake 2288.75 ml  Output    450 ml  Net 1838.75 ml    Exam:  General: Awake, Oriented x3, No acute distress.  HEENT: EOMI, moist mucous membranes  Neck: Supple  CV: S1 and S2, rrr Lungs: Clear to ascultation bilaterally, no wheezing  Abdomen: Soft, decreased tenderness RUQ and epigastric,  Not distended, +bowel sounds.  Ext: Good pulses. Trace edema. No clubbing or cyanosis noted.  Neuro: Cranial Nerves II-XII grossly intact. Has 5/5 motor strength in upper and lower extremities.   Data Reviewed: Basic Metabolic Panel:  Lab 05/23/12 6578 05/22/12 0010  NA 134* 133*  K 3.5 4.4  CL 108 97  CO2 17* 16*  GLUCOSE 86 265*  BUN 33* 41*  CREATININE 1.69* 2.97*  CALCIUM 7.5* 9.0  MG -- --  PHOS -- --   Liver Function Tests:  Lab 05/23/12 0434 05/22/12 0010  AST 470* 224*  ALT 497* 277*  ALKPHOS 121* 191*  BILITOT 2.9* 4.3*  PROT 6.0 7.7  ALBUMIN 2.4* 3.3*    Lab 05/23/12 0434 05/22/12 0010  LIPASE 475* >3000*  AMYLASE -- --   No results found for this basename: AMMONIA:5  in the last 168 hours CBC:  Lab 05/23/12 0434 05/22/12 0010  WBC 8.5 4.8  NEUTROABS -- 3.7  HGB 10.1* 12.6*  HCT 29.8* 36.1*  MCV 88.2 87.2  PLT 62* 118*   Cardiac Enzymes: No results found for this basename: CKTOTAL:5,CKMB:5,CKMBINDEX:5,TROPONINI:5 in the last 168 hours BNP: No components found with this basename: POCBNP:5 CBG:  Lab 05/22/12 2225 05/22/12 2148 05/22/12 1712 05/22/12 1428 05/22/12 0009  GLUCAP 120* 69* 131* 133* 239*    Recent Results (from the past 240 hour(s))  CULTURE, BLOOD (ROUTINE X 2)     Status: Normal (Preliminary result)   Collection Time   05/22/12 12:10 AM      Component Value Range Status Comment   Specimen  Description BLOOD RIGHT ANTECUBITAL   Final    Special Requests BOTTLES DRAWN AEROBIC AND ANAEROBIC   Final    Culture  Setup Time [id number]   Final    Culture     Final    Value: GRAM NEGATIVE RODS     Note: Gram Stain Report Called to,Read Back By and Verified With: TRIPLIN S @ 2120 ON 05/22/12 BY GOLLD   Report Status PENDING   Incomplete   CULTURE, BLOOD (ROUTINE X 2)     Status: Normal (Preliminary result)   Collection Time   05/22/12 12:15 AM      Component Value Range Status Comment   Specimen Description BLOOD LEFT ANTECUBITAL   Final    Special Requests BOTTLES DRAWN AEROBIC AND ANAEROBIC   Final    Culture  Setup Time [id number]   Final    Culture     Final    Value: GRAM NEGATIVE RODS     Note: Gram Stain Report Called to,Read Back By and Verified With: TRIPLIN S @ 2120 ON 05/22/12 BY GOLLD   Report Status PENDING   Incomplete      Studies: Ct Abdomen Pelvis Wo Contrast  05/22/2012  **ADDENDUM** CREATED: 05/22/2012 16:38:54  The liver has a nodular contour with ill-defined low density potential lesions.  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  Recommend MRI of the abdomen without contrast (patient's creatinine elevated).  If the  patient is to sick  to undergo MRI which requires breath-holding, recommend ultrasound of the liver.  Also consider serum alpha-fetoprotein analysis.  Initial dictation by Dr. Andria Meuse and addendum by Dr. Amil Amen  Findings personally  conveyed to  Dr. Elnoria Howard  05/22/2012 and 1630 hours.  **END ADDENDUM** SIGNED BY: Genevive Bi, M.D.   05/22/2012  *RADIOLOGY REPORT*  Clinical Data: Nausea, vomiting, diarrhea, body aches and chills. Elevated lipase.  CT ABDOMEN AND PELVIS WITHOUT CONTRAST  Technique:  Multidetector CT imaging of the abdomen and pelvis was performed following the standard protocol without intravenous contrast.  Comparison: None.  Findings: Atelectasis or infiltration in the lung bases.  Cardiac enlargement.  Coronary artery  calcification.  The unenhanced appearance of the liver, spleen, adrenal glands, kidneys, and retroperitoneal lymph nodes is unremarkable.  Stones in the gallbladder without obvious gallbladder wall thickening or inflammatory change.  No significant bile duct dilatation. Prominence of the ampulla which could represent mass or inflammation.  There is a high density in the ampulla consistent with a stone in the distal common bile duct.  No significant pancreatic ductal dilatation.  Mild infiltration in the peripancreatic fat vertically around the tail which might represent changes of pancreatitis.  No free fluid or free air in the abdomen. Calcification of the abdominal aorta without  aneurysm.  The stomach, small bowel, and colon are not abnormally distended.  Pelvis:  The prostate gland is mildly enlarged, measuring 4.6 x 3.4 cm.  Bladder wall is not thickened.  No free or loculated pelvic fluid collections.  No diverticulitis.  The appendix is normal.  No significant lymphadenopathy in the pelvis.  Degenerative changes in the lumbar spine.  IMPRESSION: Cholelithiasis with apparent stone in the distal common bile duct. No significant bile duct dilatation.  There is prominent soft tissue in the anterior region which might be due to inflammation or mass.  Mild infiltration around the pancreas suggesting mild pancreatitis.  Original Report Authenticated By: Genevive Bi, M.D.   Dg Chest 2 View  05/22/2012  *RADIOLOGY REPORT*  Clinical Data: Fever and nausea.  CHEST - 2 VIEW  Comparison: None.  Findings: Shallow inspiration.  Linear atelectasis or infiltration in the lung bases.  Normal heart size and pulmonary vascularity. No blunting of costophrenic angles.  No pneumothorax. Calcification of the aorta.  The mild degenerative changes in the spine.  IMPRESSION: Shallow inspiration.  Linear atelectasis or infiltration in the lung bases.  Original Report Authenticated By: Marlon Pel, M.D.   Dg Ercp With  Sphincterotomy  05/22/2012  *RADIOLOGY REPORT*  Clinical Data: Common bile duct stone  ERCP with sphincterotomy  Comparison:  CT 05/22/2012  Technique:  Multiple spot images obtained with the fluoroscopic device and submitted for interpretation post-procedure.  ERCP was performed by Dr. Melvyn Neth.  Findings: Three images are provided.  Initial image demonstrates an endoscope within the duodenum with cannulation of the common bile duct following sphincterotomy.  The balloon sweep was performed with stone extraction.  Final image demonstrates emptying of contrast in the common bile duct.  IMPRESSION: ERCP with sphincterotomy, balloon sweep, and stone extraction.  Upon review of the CT exam, the liver has a nodular contour with ill-defined low density potential lesions.  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  Recommend MRI of the abdomen without contrast (patient's creatinine elevated).  If the  patient is to sick  to undergo MRI which requires breath-holding, recommend ultrasound of the liver.  Findings conveyed to Dr. Elnoria Howard on 05/22/2012 at 1630 hours.  These images were submitted for radiologic interpretation only. Please see the procedural report for the amount of contrast and the fluoroscopy time utilized.  Original Report Authenticated By: Genevive Bi, M.D.    Scheduled Meds:    . ampicillin-sulbactam (UNASYN) 1.5 g IVPB  1.5 g Intravenous Once  . ertapenem  1 g Intravenous Q24H  . heparin  5,000 Units Subcutaneous Q8H  . insulin aspart  0-15 Units Subcutaneous TID WC  . insulin aspart  0-5 Units Subcutaneous QHS  . lisinopril  20 mg Oral Daily  . sodium chloride  3 mL Intravenous Q12H   Continuous Infusions:    . sodium chloride 125 mL/hr at 05/23/12 [phone]

## 2012-05-23 NOTE — Clinical Documentation Improvement (Signed)
GENERIC DOCUMENTATION CLARIFICATION QUERY  THIS DOCUMENT IS NOT A PERMANENT PART OF THE MEDICAL RECORD  TO RESPOND TO THE THIS QUERY, FOLLOW THE INSTRUCTIONS BELOW:  1. If needed, update documentation for the patient's encounter via the notes activity.  2. Access this query again and click edit on the In [employer].  3. After updating, or not, click F2 to complete all highlighted (required) fields concerning your review. Select "additional documentation in the medical record" OR "no additional documentation provided".  4. Click Sign note button.  5. The deficiency will fall out of your In Basket *Please let us know if you are not able to complete this workflow by phone or e-mail (listed below).  Please update your documentation within the medical record to reflect your response to this query.                                                                                        05/23/12   Dear Dr. Benjamine Mola, [facility],  In a better effort to capture your patient's severity of illness, reflect appropriate length of stay and utilization of resources, a review of the patient medical record has revealed the following indicators.    Based on your clinical judgment, please clarify and document in a progress note and/or discharge summary the clinical condition associated with the following supporting information:  In responding to this query please exercise your independent judgment.  The fact that a query is asked, does not imply that any particular answer is desired or expected.   Pt admitted with CBS stone and Pancreatitis  According to CT abd there is a "concern for cirrhosis"  Please clarify whether or not you agree with "concern for cirrhosis" or another underlying condition responsible for elevated liver function tests and document in the pn or d/c summary      Possible Clinical Conditions?  ____________________  ____________________ ____________________ _______Other  Condition__________________ _______Cannot Clinically Determine   Supporting Information:  Risk Factors: CBS stone, AKI, Pancreatitis, hyponatremia, AKI,  Signs & Symptoms:  Diagnostics:  Ct Abdomen Pelvis Wo Contrast  05/22/2012  **ADDENDUM** CREATED: 05/22/2012 16:38:54  The liver has a nodular contour with ill-defined low density potential lesions.  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  Recommend MRI of the abdomen without contrast (patient's creatinine elevated).  If the  patient is to sick  to undergo MRI which requires breath-holding, recommend ultrasound of the liver.  Also consider serum alpha-fetoprotein analysis.  Initial dictation by Dr. Andria Meuse and addendum by Dr. Amil Amen  Findings personally  conveyed to  Dr. Elnoria Howard  05/22/2012 and 1630 hours  Liver Function Tests:   Lab  05/23/12 0434  05/22/12 0010   AST  470*  224*   ALT  497*  277*   ALKPHOS  121*  191*   BILITOT  2.9*  4.3*   PROT  6.0  7.7   ALBUMIN  2.4*  3.3*      Lab  05/23/12 0434  05/22/12 0010   LIPASE  475*  >3000*   AMYLASE  --  --     Treatment Monitoring ampicillin-sulbactam (UNASYN  You may use possible,  probable, or suspect with inpatient documentation. possible, probable, suspected diagnoses MUST be documented at the time of discharge  Reviewed: additional documentation in the medical record  Thank You,  Enis Slipper  RN, BSN, CCDS Clinical Documentation Specialist [facility] Pager: [phone] / E-mail: [email or web address]   Health Information Management [place]

## 2012-05-23 NOTE — Progress Notes (Signed)
Subjective: Feeling much better.  No further abdominal pain.  Objective: Vital signs in last 24 hours: Temp:  [98.4 F (36.9 C)-99.7 F (37.6 C)] 99.7 F (37.6 C) (06/28 0610) Pulse Rate:  [79-95] 95  (06/28 0610) Resp:  [18-24] 18  (06/28 0610) BP: (96-159)/(57-100) 153/77 mmHg (06/28 0610) SpO2:  [94 %-100 %] 95 % (06/28 0610) Weight:  [54.432 kg (120 lb)] 54.432 kg (120 lb) (06/27 1215) Last BM Date: 05/21/12  Intake/Output from previous day: 06/27 0701 - 06/28 0700 In: 2288.8 [P.O.:120; I.V.:2118.8; IV Piggyback:50] Out: 450 [Urine:450] Intake/Output this shift: Total I/O In: 480 [P.O.:480] Out: -   General appearance: alert and no distress GI: soft, non-tender; bowel sounds normal; no masses,  no organomegaly  Lab Results:  Basename 05/23/12 0434 05/22/12 0010  WBC 8.5 4.8  HGB 10.1* 12.6*  HCT 29.8* 36.1*  PLT 62* 118*   BMET  Basename 05/23/12 0434 05/22/12 0010  NA 134* 133*  K 3.5 4.4  CL 108 97  CO2 17* 16*  GLUCOSE 86 265*  BUN 33* 41*  CREATININE 1.69* 2.97*  CALCIUM 7.5* 9.0   LFT  Basename 05/23/12 0434  PROT 6.0  ALBUMIN 2.4*  AST 470*  ALT 497*  ALKPHOS 121*  BILITOT 2.9*  BILIDIR --  IBILI --   PT/INR No results found for this basename: LABPROT:2,INR:2 in the last 72 hours Hepatitis Panel No results found for this basename: HEPBSAG,HCVAB,HEPAIGM,HEPBIGM in the last 72 hours C-Diff No results found for this basename: CDIFFTOX:3 in the last 72 hours Fecal Lactopherrin No results found for this basename: FECLLACTOFRN in the last 72 hours  Studies/Results: Ct Abdomen Pelvis Wo Contrast  05/22/2012  **ADDENDUM** CREATED: 05/22/2012 16:38:54  The liver has a nodular contour with ill-defined low density potential lesions.  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  Recommend MRI of the abdomen without contrast (patient's creatinine elevated).  If the  patient is to sick  to undergo MRI which requires breath-holding, recommend  ultrasound of the liver.  Also consider serum alpha-fetoprotein analysis.  Initial dictation by Dr. Andria Meuse and addendum by Dr. Amil Amen  Findings personally  conveyed to  Dr. Elnoria Howard  05/22/2012 and 1630 hours.  **END ADDENDUM** SIGNED BY: Genevive Bi, M.D.   05/22/2012  *RADIOLOGY REPORT*  Clinical Data: Nausea, vomiting, diarrhea, body aches and chills. Elevated lipase.  CT ABDOMEN AND PELVIS WITHOUT CONTRAST  Technique:  Multidetector CT imaging of the abdomen and pelvis was performed following the standard protocol without intravenous contrast.  Comparison: None.  Findings: Atelectasis or infiltration in the lung bases.  Cardiac enlargement.  Coronary artery calcification.  The unenhanced appearance of the liver, spleen, adrenal glands, kidneys, and retroperitoneal lymph nodes is unremarkable.  Stones in the gallbladder without obvious gallbladder wall thickening or inflammatory change.  No significant bile duct dilatation. Prominence of the ampulla which could represent mass or inflammation.  There is a high density in the ampulla consistent with a stone in the distal common bile duct.  No significant pancreatic ductal dilatation.  Mild infiltration in the peripancreatic fat vertically around the tail which might represent changes of pancreatitis.  No free fluid or free air in the abdomen. Calcification of the abdominal aorta without aneurysm.  The stomach, small bowel, and colon are not abnormally distended.  Pelvis:  The prostate gland is mildly enlarged, measuring 4.6 x 3.4 cm.  Bladder wall is not thickened.  No free or loculated pelvic fluid collections.  No diverticulitis.  The appendix is  normal.  No significant lymphadenopathy in the pelvis.  Degenerative changes in the lumbar spine.  IMPRESSION: Cholelithiasis with apparent stone in the distal common bile duct. No significant bile duct dilatation.  There is prominent soft tissue in the anterior region which might be due to inflammation or mass.  Mild  infiltration around the pancreas suggesting mild pancreatitis.  Original Report Authenticated By: Genevive Bi, M.D.   Dg Chest 2 View  05/22/2012  *RADIOLOGY REPORT*  Clinical Data: Fever and nausea.  CHEST - 2 VIEW  Comparison: None.  Findings: Shallow inspiration.  Linear atelectasis or infiltration in the lung bases.  Normal heart size and pulmonary vascularity. No blunting of costophrenic angles.  No pneumothorax. Calcification of the aorta.  The mild degenerative changes in the spine.  IMPRESSION: Shallow inspiration.  Linear atelectasis or infiltration in the lung bases.  Original Report Authenticated By: Marlon Pel, M.D.   Dg Ercp With Sphincterotomy  05/22/2012  *RADIOLOGY REPORT*  Clinical Data: Common bile duct stone  ERCP with sphincterotomy  Comparison:  CT 05/22/2012  Technique:  Multiple spot images obtained with the fluoroscopic device and submitted for interpretation post-procedure.  ERCP was performed by Dr. Melvyn Neth.  Findings: Three images are provided.  Initial image demonstrates an endoscope within the duodenum with cannulation of the common bile duct following sphincterotomy.  The balloon sweep was performed with stone extraction.  Final image demonstrates emptying of contrast in the common bile duct.  IMPRESSION: ERCP with sphincterotomy, balloon sweep, and stone extraction.  Upon review of the CT exam, the liver has a nodular contour with ill-defined low density potential lesions.  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  Recommend MRI of the abdomen without contrast (patient's creatinine elevated).  If the  patient is to sick  to undergo MRI which requires breath-holding, recommend ultrasound of the liver.  Findings conveyed to Dr. Elnoria Howard on 05/22/2012 at 1630 hours.  These images were submitted for radiologic interpretation only. Please see the procedural report for the amount of contrast and the fluoroscopy time utilized.  Original Report Authenticated By: Genevive Bi, M.D.    Medications:  Scheduled:   . ampicillin-sulbactam (UNASYN) 1.5 g IVPB  1.5 g Intravenous Once  . ertapenem  1 g Intravenous Q24H  . insulin aspart  0-15 Units Subcutaneous TID WC  . insulin aspart  0-5 Units Subcutaneous QHS  . lisinopril  20 mg Oral Daily  . sodium chloride  3 mL Intravenous Q12H  . DISCONTD: heparin  5,000 Units Subcutaneous Q8H   Continuous:   . sodium chloride 125 mL/hr at 05/23/12 1610    Assessment/Plan: 1) Gallstone pancreatitis. 2) Cholelithiasis - ? Cholecystitis. 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   No problems post ERCP.  He feels better at this time, however, I was contacted by Radiology yesterday about his CT scan.  There is the suggestion of cirrhosis on the noncontrast scan with the possibility of hepatomas.  Since he is [place] HBV is a high consideration.  I will check him for this issue and I agree with the AFP.  Additionally, a noncontrast MRI scan was recommended for further work up.  Radiology mentioned that his gallbladder appeared to be "sick", but clinically he is well.  A surgical consultation is in order.  Plan: 1) Check HBV and HCV status. 2) MRI of the ABM. 3) Surgical consultation.  LOS: 2 days   Ai Sonnenfeld D 05/23/2012, 10:06 AM

## 2012-05-23 NOTE — Consult Note (Signed)
Reason for Consult: Gallstone pancreatitis Referring Physician: Elnoria Howard  Christian Wallace is an 71 y.o. male.  HPI: The patient is a 71 year old gentleman who presented to abdominal pain. He was found to have pancreatitis. CT scan shows cholelithiasis with stones in the distal common bile duct. Findings were consistent with pancreatitis. See in consultation by Dr. Luisa Hart on who subsequently returned to the endoscopy suite and performed an ERCP with removal of stones on 05/22/2012.  He was seen post procedure today by Dr. Jeani Hawking. The CT shows a suggestion of cirrhosis on a noncontrast CT were possibility that hepatomas. It was his opinion the patient is from and 9 with HPV a high consideration and he is going to evaluate him with blood studies and a noncontrast MRI. He asked to see the patient for possible cholecystectomy. The patient's through his family and has reported some chest discomfort, it sounds exertional but the patient and his family are rather vague about this.  Past Medical History  Diagnosis Date  . Diabetes mellitus   . HTN (hypertension)   . HLD (hyperlipidemia)   . Anginal pain   . Dysphagia   . Neuromuscular disorder     muscle pain in arms    Past Surgical History  Procedure Date  . No past surgeries     History reviewed. No pertinent family history.  Social History:  reports that he has quit smoking. His smoking use included Cigarettes. He has never used smokeless tobacco. He reports that he drinks alcohol. He reports that he does not use illicit drugs.  Allergies: No Known Allergies  Medications:  Prior to Admission:  No prescriptions prior to admission   Scheduled:   . ertapenem  1 g Intravenous Q24H  . insulin aspart  0-15 Units Subcutaneous TID WC  . insulin aspart  0-5 Units Subcutaneous QHS  . lisinopril  20 mg Oral Daily  . sodium chloride  3 mL Intravenous Q12H  . DISCONTD: heparin  5,000 Units Subcutaneous Q8H   Continuous:   . sodium chloride 75  mL/hr at 05/23/12 0806   ZOX:[id number] injection, ondansetron (ZOFRAN) IV, ondansetron Anti-infectives     Start     Dose/Rate Route Frequency Ordered Stop   05/23/12 0600   ertapenem (INVANZ) 1 g in sodium chloride 0.9 % 50 mL IVPB        1 g 100 mL/hr over 30 Minutes Intravenous Every 24 hours 05/22/12 1215     05/22/12 1500   ampicillin-sulbactam (UNASYN) 1.5 g in sodium chloride 0.9 % 50 mL IVPB        1.5 g 100 mL/hr over 30 Minutes Intravenous  Once 05/22/12 1457 05/22/12 1527   05/22/12 0615   ertapenem (INVANZ) 1 g in sodium chloride 0.9 % 50 mL IVPB        1 g 100 mL/hr over 30 Minutes Intravenous  Once 05/22/12 9811 05/22/12 9147          Results for orders placed during the hospital encounter of 05/21/12 (from the past 48 hour(s))  GLUCOSE, CAPILLARY     Status: Abnormal   Collection Time   05/22/12 12:09 AM      Component Value Range Comment   Glucose-Capillary 239 (*) 70 - 99 mg/dL    Comment 1 Documented in Chart      Comment 2 Notify RN     CULTURE, BLOOD (ROUTINE X 2)     Status: Normal (Preliminary result)   Collection Time   05/22/12 12:10 AM  Component Value Range Comment   Specimen Description BLOOD RIGHT ANTECUBITAL      Special Requests BOTTLES DRAWN AEROBIC AND ANAEROBIC      Culture  Setup Time [id number]      Culture        Value: GRAM NEGATIVE RODS     Note: Gram Stain Report Called to,Read Back By and Verified With: TRIPLIN S @ 2120 ON 05/22/12 BY GOLLD   Report Status PENDING     CBC WITH DIFFERENTIAL     Status: Abnormal   Collection Time   05/22/12 12:10 AM      Component Value Range Comment   WBC 4.8  4.0 - 10.5 K/uL    RBC 4.14 (*) 4.22 - 5.81 MIL/uL    Hemoglobin 12.6 (*) 13.0 - 17.0 g/dL    HCT 81.1 (*) 91.4 - 52.0 %    MCV 87.2  78.0 - 100.0 fL    MCH 30.4  26.0 - 34.0 pg    MCHC 34.9  30.0 - 36.0 g/dL    RDW 78.2  95.6 - 21.3 %    Platelets 118 (*) 150 - 400 K/uL    Neutrophils Relative 77  43 - 77 %    Neutro Abs 3.7   1.7 - 7.7 K/uL    Lymphocytes Relative 22  12 - 46 %    Lymphs Abs 1.1  0.7 - 4.0 K/uL    Monocytes Relative 1 (*) 3 - 12 %    Monocytes Absolute 0.1  0.1 - 1.0 K/uL    Eosinophils Relative 0  0 - 5 %    Eosinophils Absolute 0.0  0.0 - 0.7 K/uL    Basophils Relative 0  0 - 1 %    Basophils Absolute 0.0  0.0 - 0.1 K/uL   COMPREHENSIVE METABOLIC PANEL     Status: Abnormal   Collection Time   05/22/12 12:10 AM      Component Value Range Comment   Sodium 133 (*) 135 - 145 mEq/L    Potassium 4.4  3.5 - 5.1 mEq/L    Chloride 97  96 - 112 mEq/L    CO2 16 (*) 19 - 32 mEq/L    Glucose, Bld 265 (*) 70 - 99 mg/dL    BUN 41 (*) 6 - 23 mg/dL    Creatinine, Ser 0.86 (*) 0.50 - 1.35 mg/dL    Calcium 9.0  8.4 - 57.8 mg/dL    Total Protein 7.7  6.0 - 8.3 g/dL    Albumin 3.3 (*) 3.5 - 5.2 g/dL    AST 469 (*) 0 - 37 U/L    ALT 277 (*) 0 - 53 U/L    Alkaline Phosphatase 191 (*) 39 - 117 U/L    Total Bilirubin 4.3 (*) 0.3 - 1.2 mg/dL    GFR calc non Af Amer 19 (*) >90 mL/min    GFR calc Af Amer 23 (*) >90 mL/min   LIPASE, BLOOD     Status: Abnormal   Collection Time   05/22/12 12:10 AM      Component Value Range Comment   Lipase >3000 (*) 11 - 59 U/L   CULTURE, BLOOD (ROUTINE X 2)     Status: Normal (Preliminary result)   Collection Time   05/22/12 12:15 AM      Component Value Range Comment   Specimen Description BLOOD LEFT ANTECUBITAL      Special Requests BOTTLES DRAWN AEROBIC AND ANAEROBIC  Culture  Setup Time [id number]      Culture        Value: GRAM NEGATIVE RODS     Note: Gram Stain Report Called to,Read Back By and Verified With: TRIPLIN S @ 2120 ON 05/22/12 BY GOLLD   Report Status PENDING     URINALYSIS, ROUTINE W REFLEX MICROSCOPIC     Status: Abnormal   Collection Time   05/22/12  1:31 AM      Component Value Range Comment   Color, Urine ORANGE (*) YELLOW BIOCHEMICALS MAY BE AFFECTED BY COLOR   APPearance CLOUDY (*) CLEAR    Specific Gravity, Urine 1.019  1.005 - 1.030     pH 5.0  5.0 - 8.0    Glucose, UA 250 (*) NEGATIVE mg/dL    Hgb urine dipstick TRACE (*) NEGATIVE    Bilirubin Urine MODERATE (*) NEGATIVE    Ketones, ur TRACE (*) NEGATIVE mg/dL    Protein, ur 962 (*) NEGATIVE mg/dL    Urobilinogen, UA 1.0  0.0 - 1.0 mg/dL    Nitrite POSITIVE (*) NEGATIVE    Leukocytes, UA SMALL (*) NEGATIVE   URINE MICROSCOPIC-ADD ON     Status: Abnormal   Collection Time   05/22/12  1:31 AM      Component Value Range Comment   Squamous Epithelial / LPF FEW (*) RARE    WBC, UA 3-6  <3 WBC/hpf    Bacteria, UA MANY (*) RARE    Casts GRANULAR CAST (*) NEGATIVE   GLUCOSE, CAPILLARY     Status: Abnormal   Collection Time   05/22/12  2:28 PM      Component Value Range Comment   Glucose-Capillary 133 (*) 70 - 99 mg/dL   GLUCOSE, CAPILLARY     Status: Abnormal   Collection Time   05/22/12  5:12 PM      Component Value Range Comment   Glucose-Capillary 131 (*) 70 - 99 mg/dL   GLUCOSE, CAPILLARY     Status: Abnormal   Collection Time   05/22/12  9:48 PM      Component Value Range Comment   Glucose-Capillary 69 (*) 70 - 99 mg/dL   GLUCOSE, CAPILLARY     Status: Abnormal   Collection Time   05/22/12 10:25 PM      Component Value Range Comment   Glucose-Capillary 120 (*) 70 - 99 mg/dL   COMPREHENSIVE METABOLIC PANEL     Status: Abnormal   Collection Time   05/23/12  4:34 AM      Component Value Range Comment   Sodium 134 (*) 135 - 145 mEq/L    Potassium 3.5  3.5 - 5.1 mEq/L    Chloride 108  96 - 112 mEq/L    CO2 17 (*) 19 - 32 mEq/L    Glucose, Bld 86  70 - 99 mg/dL    BUN 33 (*) 6 - 23 mg/dL    Creatinine, Ser 9.52 (*) 0.50 - 1.35 mg/dL    Calcium 7.5 (*) 8.4 - 10.5 mg/dL    Total Protein 6.0  6.0 - 8.3 g/dL    Albumin 2.4 (*) 3.5 - 5.2 g/dL    AST 841 (*) 0 - 37 U/L    ALT 497 (*) 0 - 53 U/L    Alkaline Phosphatase 121 (*) 39 - 117 U/L    Total Bilirubin 2.9 (*) 0.3 - 1.2 mg/dL    GFR calc non Af Amer 38 (*) >90 mL/min    GFR calc  Af Amer 45 (*) >90 mL/min    CBC     Status: Abnormal   Collection Time   05/23/12  4:34 AM      Component Value Range Comment   WBC 8.5  4.0 - 10.5 K/uL    RBC 3.38 (*) 4.22 - 5.81 MIL/uL    Hemoglobin 10.1 (*) 13.0 - 17.0 g/dL    HCT 16.1 (*) 09.6 - 52.0 %    MCV 88.2  78.0 - 100.0 fL    MCH 29.9  26.0 - 34.0 pg    MCHC 33.9  30.0 - 36.0 g/dL    RDW 04.5  40.9 - 81.1 %    Platelets 62 (*) 150 - 400 K/uL   LIPASE, BLOOD     Status: Abnormal   Collection Time   05/23/12  4:34 AM      Component Value Range Comment   Lipase 475 (*) 11 - 59 U/L   GLUCOSE, CAPILLARY     Status: Abnormal   Collection Time   05/23/12  8:19 AM      Component Value Range Comment   Glucose-Capillary 137 (*) 70 - 99 mg/dL   AFP TUMOR MARKER     Status: Normal   Collection Time   05/23/12  8:35 AM      Component Value Range Comment   AFP-Tumor Marker <1.3  0.0 - 8.0 ng/mL   GLUCOSE, CAPILLARY     Status: Abnormal   Collection Time   05/23/12 11:23 AM      Component Value Range Comment   Glucose-Capillary 112 (*) 70 - 99 mg/dL     Ct Abdomen Pelvis Wo Contrast  05/22/2012  **ADDENDUM** CREATED: 05/22/2012 16:38:54  The liver has a nodular contour with ill-defined low density potential lesions.  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  Recommend MRI of the abdomen without contrast (patient's creatinine elevated).  If the  patient is to sick  to undergo MRI which requires breath-holding, recommend ultrasound of the liver.  Also consider serum alpha-fetoprotein analysis.  Initial dictation by Dr. Andria Meuse and addendum by Dr. Amil Amen  Findings personally  conveyed to  Dr. Elnoria Howard  05/22/2012 and 1630 hours.  **END ADDENDUM** SIGNED BY: Genevive Bi, M.D.   05/22/2012  *RADIOLOGY REPORT*  Clinical Data: Nausea, vomiting, diarrhea, body aches and chills. Elevated lipase.  CT ABDOMEN AND PELVIS WITHOUT CONTRAST  Technique:  Multidetector CT imaging of the abdomen and pelvis was performed following the standard protocol without intravenous  contrast.  Comparison: None.  Findings: Atelectasis or infiltration in the lung bases.  Cardiac enlargement.  Coronary artery calcification.  The unenhanced appearance of the liver, spleen, adrenal glands, kidneys, and retroperitoneal lymph nodes is unremarkable.  Stones in the gallbladder without obvious gallbladder wall thickening or inflammatory change.  No significant bile duct dilatation. Prominence of the ampulla which could represent mass or inflammation.  There is a high density in the ampulla consistent with a stone in the distal common bile duct.  No significant pancreatic ductal dilatation.  Mild infiltration in the peripancreatic fat vertically around the tail which might represent changes of pancreatitis.  No free fluid or free air in the abdomen. Calcification of the abdominal aorta without aneurysm.  The stomach, small bowel, and colon are not abnormally distended.  Pelvis:  The prostate gland is mildly enlarged, measuring 4.6 x 3.4 cm.  Bladder wall is not thickened.  No free or loculated pelvic fluid collections.  No diverticulitis.  The appendix is  normal.  No significant lymphadenopathy in the pelvis.  Degenerative changes in the lumbar spine.  IMPRESSION: Cholelithiasis with apparent stone in the distal common bile duct. No significant bile duct dilatation.  There is prominent soft tissue in the anterior region which might be due to inflammation or mass.  Mild infiltration around the pancreas suggesting mild pancreatitis.  Original Report Authenticated By: Genevive Bi, M.D.   Dg Chest 2 View  05/22/2012  *RADIOLOGY REPORT*  Clinical Data: Fever and nausea.  CHEST - 2 VIEW  Comparison: None.  Findings: Shallow inspiration.  Linear atelectasis or infiltration in the lung bases.  Normal heart size and pulmonary vascularity. No blunting of costophrenic angles.  No pneumothorax. Calcification of the aorta.  The mild degenerative changes in the spine.  IMPRESSION: Shallow inspiration.  Linear  atelectasis or infiltration in the lung bases.  Original Report Authenticated By: Marlon Pel, M.D.   Dg Ercp With Sphincterotomy  05/22/2012  *RADIOLOGY REPORT*  Clinical Data: Common bile duct stone  ERCP with sphincterotomy  Comparison:  CT 05/22/2012  Technique:  Multiple spot images obtained with the fluoroscopic device and submitted for interpretation post-procedure.  ERCP was performed by Dr. Melvyn Neth.  Findings: Three images are provided.  Initial image demonstrates an endoscope within the duodenum with cannulation of the common bile duct following sphincterotomy.  The balloon sweep was performed with stone extraction.  Final image demonstrates emptying of contrast in the common bile duct.  IMPRESSION: ERCP with sphincterotomy, balloon sweep, and stone extraction.  Upon review of the CT exam, the liver has a nodular contour with ill-defined low density potential lesions.  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  Recommend MRI of the abdomen without contrast (patient's creatinine elevated).  If the  patient is to sick  to undergo MRI which requires breath-holding, recommend ultrasound of the liver.  Findings conveyed to Dr. Elnoria Howard on 05/22/2012 at 1630 hours.  These images were submitted for radiologic interpretation only. Please see the procedural report for the amount of contrast and the fluoroscopy time utilized.  Original Report Authenticated By: Genevive Bi, M.D.    Review of Systems  Unable to perform ROS: language   Blood pressure 157/79, pulse 69, temperature 98.3 F (36.8 C), temperature source Oral, resp. rate 18, height 5\' 6"  (1.676 m), weight 54.432 kg (120 lb), SpO2 96.00%. Physical Exam  Constitutional: He is oriented to person, place, and time. He appears well-developed and well-nourished.       Thin [place] gentleman, NAD, he speaks no [place], family members speak some [place].  HENT:  Head: Normocephalic and atraumatic.  Nose: Nose normal.  Eyes:  Conjunctivae and EOM are normal. Pupils are equal, round, and reactive to light. Right eye exhibits discharge. Left eye exhibits no discharge. No scleral icterus.  Neck: Normal range of motion. Neck supple. No JVD present. No tracheal deviation present. No thyromegaly present.  Cardiovascular: Normal rate, normal heart sounds and intact distal pulses.  Exam reveals no gallop.   No murmur heard. Respiratory: Effort normal and breath sounds normal. No respiratory distress. He has no wheezes. He has no rales. He exhibits no tenderness.  GI: Soft. Bowel sounds are normal. He exhibits no distension and no mass. There is no tenderness. There is no rebound and no guarding.  Musculoskeletal: Normal range of motion. He exhibits no edema.  Neurological: He is alert and oriented to person, place, and time. No cranial nerve deficit.  Skin: Skin is warm and dry. No erythema.  Psychiatric:  He has a normal mood and affect. His behavior is normal. Judgment and thought content normal.  Pt. Seen and examined by DR. Carolynne Edouard.  Assessment/Plan: 1. Gallstone pancreatitis, with improving lipase. 2. Abnormal CT of the liver, ongoing elevated LFTs. Possible cirrhosis. 3. History chest pain on exertion 4. Diabetes mellitus 5. Hypertension 6. Dyslipidemia 7. History of anginal pain 8. Dysphasia  Plan: Was seen and examined by Dr. Carolynne Edouard. He's going to require MRI to evaluate his liver. He will require improvement in his LFTs and lipase. Cardiac clearance we'll also be needed prior to surgery. Will follow with you. Currently his exam is completely normal. [facility] physician assistant for Dr. Chevis Pretty.  Leisa Gault 05/23/2012, 4:13 PM

## 2012-05-23 NOTE — Clinical Documentation Improvement (Signed)
SEPSIS DOCUMENTATION QUERY  THIS DOCUMENT IS NOT A PERMANENT PART OF THE MEDICAL RECORD  TO RESPOND TO THE THIS QUERY, FOLLOW THE INSTRUCTIONS BELOW:  1. If needed, update documentation for the patient's encounter via the notes activity.  2. Access this query again and click edit on the In [employer].  3. After updating, or not, click F2 to complete all highlighted (required) fields concerning your review. Select "additional documentation in the medical record" OR "no additional documentation provided".  4. Click Sign note button.  5. The deficiency will fall out of your In Basket *Please let us know if you are not able to complete this workflow by phone or e-mail (listed below).  Please update your documentation within the medical record to reflect your response to this query.                                                                                    05/23/12  Dear Dr. Benjamine Mola, J/Associates,  In a better effort to capture your patient's severity of illness, reflect appropriate length of stay and utilization of resources, a review of the patient medical record has revealed the following indicators.    Based on your clinical judgment, please clarify and document in a progress note and/or discharge summary the clinical condition associated with the following supporting information:  In responding to this query please exercise your independent judgment.  The fact that a query is asked, does not imply that any particular answer is desired or expected.    Pt admitted with CBS stone and Pancreatitis  According to blood cultures pt with gram negative rods   Please clarify the underlying cause for the positive blood culture of 'gram neg rods' and document in pn or d/c summary.    Possible Clinical Conditions?  Septicemia / Sepsis Severe Sepsis Neutropenic Sepsis  SIRS Septic Shock Sepsis with UTI  Other Condition __________________ Cannot clinically Determine  _______________     Risk Factors: Note: Gram Stain Report Called to,Read Back By and Verified With: TRIPLIN S @ 2120 ON 05/22/12 BY GOLLD  Presenting Signs and Symptoms: PN 04/22/12 Fever- abx , seems to be resolving  10. Chills- abx , resolved  Diagnostics:   05/22/12 Temp 102.5 (39.2) B/P 99/54 90/51 96/57   05/21/12 Temp 102.4 (39.1)  Cultures:  Blood Culture  Value:  GRAM NEGATIVE RODS Treatment:  Treatment ampicillin-sulbactam (UNASYN   Reviewed: additional documentation in the medical record  Thank You,  Enis Slipper  RN, BSN, CCDS Clinical Documentation Specialist [facility] Dept Pager: [phone] / E-mail: [email or web address]  Health Information Management [place]

## 2012-05-24 ENCOUNTER — Other Ambulatory Visit: Payer: Self-pay

## 2012-05-24 DIAGNOSIS — E876 Hypokalemia: Secondary | ICD-10-CM

## 2012-05-24 DIAGNOSIS — K8001 Calculus of gallbladder with acute cholecystitis with obstruction: Secondary | ICD-10-CM

## 2012-05-24 DIAGNOSIS — R112 Nausea with vomiting, unspecified: Secondary | ICD-10-CM

## 2012-05-24 DIAGNOSIS — N179 Acute kidney failure, unspecified: Secondary | ICD-10-CM

## 2012-05-24 LAB — COMPREHENSIVE METABOLIC PANEL
ALT: 324 U/L — ABNORMAL HIGH (ref 0–53)
AST: 191 U/L — ABNORMAL HIGH (ref 0–37)
Albumin: 2.1 g/dL — ABNORMAL LOW (ref 3.5–5.2)
CO2: 18 mEq/L — ABNORMAL LOW (ref 19–32)
Calcium: 7.7 mg/dL — ABNORMAL LOW (ref 8.4–10.5)
Chloride: 108 mEq/L (ref 96–112)
Creatinine, Ser: 1.08 mg/dL (ref 0.50–1.35)
GFR calc non Af Amer: 66 mL/min — ABNORMAL LOW (ref >90)
Sodium: 132 mEq/L — ABNORMAL LOW (ref 135–145)
Total Bilirubin: 3 mg/dL — ABNORMAL HIGH (ref 0.3–1.2)

## 2012-05-24 LAB — CBC
HCT: 29.4 % — ABNORMAL LOW (ref 39.0–52.0)
MCH: 30.3 pg (ref 26.0–34.0)
MCV: 86.5 fL (ref 78.0–100.0)
Platelets: 66 10*3/uL — ABNORMAL LOW (ref 150–400)
RBC: 3.4 MIL/uL — ABNORMAL LOW (ref 4.22–5.81)
WBC: 7.3 10*3/uL (ref 4.0–10.5)

## 2012-05-24 LAB — GLUCOSE, CAPILLARY
Glucose-Capillary: 190 mg/dL — ABNORMAL HIGH (ref 70–99)
Glucose-Capillary: 141 mg/dL — ABNORMAL HIGH (ref 70–99)

## 2012-05-24 LAB — PROTIME-INR: INR: 0.95 (ref 0.00–1.49)

## 2012-05-24 MED ORDER — AMLODIPINE BESYLATE 5 MG PO TABS
5.0000 mg | ORAL_TABLET | Freq: Every day | ORAL | Status: DC
  Administered 2012-05-24 – 2012-06-01 (×9): 5 mg via ORAL
  Filled 2012-05-24 (×9): qty 1

## 2012-05-24 MED ORDER — SENNOSIDES-DOCUSATE SODIUM 8.6-50 MG PO TABS
1.0000 | ORAL_TABLET | Freq: Two times a day (BID) | ORAL | Status: DC
  Administered 2012-05-24 – 2012-06-01 (×17): 1 via ORAL
  Filled 2012-05-24 (×18): qty 1

## 2012-05-24 MED ORDER — HYDRALAZINE HCL 25 MG PO TABS
25.0000 mg | ORAL_TABLET | Freq: Three times a day (TID) | ORAL | Status: DC
  Administered 2012-05-24 – 2012-06-01 (×23): 25 mg via ORAL
  Filled 2012-05-24 (×26): qty 1

## 2012-05-24 MED ORDER — CIPROFLOXACIN HCL 500 MG PO TABS
500.0000 mg | ORAL_TABLET | Freq: Two times a day (BID) | ORAL | Status: DC
  Administered 2012-05-24 – 2012-06-01 (×17): 500 mg via ORAL
  Filled 2012-05-24 (×19): qty 1

## 2012-05-24 MED ORDER — LISINOPRIL 40 MG PO TABS
40.0000 mg | ORAL_TABLET | Freq: Every day | ORAL | Status: DC
  Administered 2012-05-24 – 2012-06-01 (×9): 40 mg via ORAL
  Filled 2012-05-24 (×9): qty 1

## 2012-05-24 NOTE — Progress Notes (Signed)
Subjective: No acute events.  No complaints of pain.  Objective: Vital signs in last 24 hours: Temp:  [97.8 F (36.6 C)-98.3 F (36.8 C)] 97.8 F (36.6 C) (06/29 0629) Pulse Rate:  [63-71] 63  (06/29 0629) Resp:  [18] 18  (06/29 0629) BP: (157-175)/(77-86) 170/77 mmHg (06/29 0629) SpO2:  [94 %-96 %] 96 % (06/29 0629) Last BM Date: 05/21/12  Intake/Output from previous day: 06/28 0701 - 06/29 0700 In: 1448.8 [P.O.:480; I.V.:968.8] Out: -  Intake/Output this shift:    General appearance: alert and no distress GI: soft, non-tender; bowel sounds normal; no masses,  no organomegaly  Lab Results:  [facility] 05/24/12 0433 05/23/12 0434 05/22/12 0010  WBC 7.3 8.5 4.8  HGB 10.3* 10.1* 12.6*  HCT 29.4* 29.8* 36.1*  PLT 66* 62* 118*   BMET  Basename 05/24/12 0433 05/23/12 0434 05/22/12 0010  NA 132* 134* 133*  K 3.6 3.5 4.4  CL 108 108 97  CO2 18* 17* 16*  GLUCOSE 114* 86 265*  BUN 23 33* 41*  CREATININE 1.08 1.69* 2.97*  CALCIUM 7.7* 7.5* 9.0   LFT  Basename 05/24/12 0433  PROT 5.9*  ALBUMIN 2.1*  AST 191*  ALT 324*  ALKPHOS 146*  BILITOT 3.0*  BILIDIR --  IBILI --   PT/INR No results found for this basename: LABPROT:2,INR:2 in the last 72 hours Hepatitis Panel No results found for this basename: HEPBSAG,HCVAB,HEPAIGM,HEPBIGM in the last 72 hours C-Diff No results found for this basename: CDIFFTOX:3 in the last 72 hours Fecal Lactopherrin No results found for this basename: FECLLACTOFRN in the last 72 hours  Studies/Results: Mr Abdomen Wo Contrast  05/23/2012  *RADIOLOGY REPORT*  Clinical Data: Abnormal CT.  The patient gallstone pancreatitis.  MRI ABDOMEN WITHOUT CONTRAST  Technique:  Multiplanar multisequence MR imaging of the abdomen was performed. No intravenous contrast was administered.  Comparison: CT 05/22/2012, ERCP 05/22/2012  Findings: In small bilateral pleural effusions present.  No pericardial fluid.  No discrete lesions within the liver on the  non contrast pulse sequences.  Liver does  however have a fine nodular contour best seen on the coronal T2-weighted imaging along the right hepatic margin.  There is no intrahepatic biliary ductal dilatation.  There are large gallstones within the gallbladder.  Stones  measure up to 1.7 cm.  There is gallbladder wall thickening up to 9 mm.  The common bile duct appears normal following ERCP and stone extraction.  No filling defects in the common bile duct are evident.  The pancreas and pancreatic duct are normal.  There is small amount of fluid inflammation along the pancreatic body and tail with small of fluid collecting along the anterior left pararenal fascia.  No organized fluid collections.  The spleen is normal.  The portal vein and splenic vein.  patent this noncontrast exam.  The adrenal glands and kidneys are normal.  IMPRESSION:  1.  Nodular liver suggest cirrhosis.  No focal lesions to suggest hepatoma or metastasis. 2.  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  3. Small of fluid along the tail the pancreas and mild peripancreatic edema consistent with mild pancreatitis.  No organized fluid collections. 4.  Common bile duct appears normal without evidence of filling defect following ERCP and stone extraction.  Original Report Authenticated By: Genevive Bi, M.D.   Dg Ercp With Sphincterotomy  05/22/2012  *RADIOLOGY REPORT*  Clinical Data: Common bile duct stone  ERCP with sphincterotomy  Comparison:  CT 05/22/2012  Technique:  Multiple  spot images obtained with the fluoroscopic device and submitted for interpretation post-procedure.  ERCP was performed by Dr. Melvyn Neth.  Findings: Three images are provided.  Initial image demonstrates an endoscope within the duodenum with cannulation of the common bile duct following sphincterotomy.  The balloon sweep was performed with stone extraction.  Final image demonstrates emptying of contrast in the common bile duct.  IMPRESSION:  ERCP with sphincterotomy, balloon sweep, and stone extraction.  Upon review of the CT exam, the liver has a nodular contour with ill-defined low density potential lesions.  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  Recommend MRI of the abdomen without contrast (patient's creatinine elevated).  If the  patient is to sick  to undergo MRI which requires breath-holding, recommend ultrasound of the liver.  Findings conveyed to Dr. Elnoria Howard on 05/22/2012 at 1630 hours.  These images were submitted for radiologic interpretation only. Please see the procedural report for the amount of contrast and the fluoroscopy time utilized.  Original Report Authenticated By: Genevive Bi, M.D.    Medications:  Scheduled:   . amLODipine  5 mg Oral Daily  . ertapenem  1 g Intravenous Q24H  . insulin aspart  0-15 Units Subcutaneous TID WC  . insulin aspart  0-5 Units Subcutaneous QHS  . lisinopril  40 mg Oral Daily  . senna-docusate  1 tablet Oral BID  . sodium chloride  3 mL Intravenous Q12H  . DISCONTD: lisinopril  20 mg Oral Daily   Continuous:   . DISCONTD: sodium chloride 75 mL/hr at 05/23/12 2019    Assessment/Plan: 1) Gallstone pancreatitis. 2) Cirrhosis. 3) Bateremia/Ascending cholangitis 4) Enterococcal UTI.    Clinically he appears well.  I will advance his diet to a low fat diet.  Appreciate surgical consultation.  From the GI standpoint he most likely had an ascending cholangitis as some suspicious of pus was noted.  The CT scan reports that he has significant gallbladder disease and he may have subclinical cholecystitis.  His urine reveals an enterococcal UTI and both sensitivities are pending at this time.  As for his cirrhosis, the HBV and HCV serologies are pending.  I was not able to relay this information as he and his wife only speak limited [place].  Plan: 1) Cipro 400 mg BID. 2) Advance to regular diet. 3) Continue with supportive care.  LOS: 3 days   Kennetta Pavlovic D 05/24/2012,  9:16 AM

## 2012-05-24 NOTE — Evaluation (Signed)
Physical Therapy Evaluation Patient Details Name: Christian Wallace MRN: [id number] DOB: [birth year] Today's Date: 05/24/2012 Time: 1030-1055 PT Time Calculation (min): 25 min  PT Assessment / Plan / Recommendation Clinical Impression  71 y/o [place] male admitted with gallstone pancreatitis and cholelithiasis, s/p extraction.  Spoke with son on phone at beginning of eval and other family in at end of eval.  Pt would benefit from acute PT and recommend HHPT and RW for home use.    PT Assessment  Patient needs continued PT services    Follow Up Recommendations  Home health PT    Barriers to Discharge        Equipment Recommendations  Rolling walker with 5" wheels    Recommendations for Other Services     Frequency Min 3X/week    Precautions / Restrictions Restrictions Weight Bearing Restrictions: No   Pertinent Vitals/Pain No c/o pain      Mobility  Bed Mobility Bed Mobility: Sitting - Scoot to Edge of Bed;Supine to Sit Supine to Sit: 4: Min assist Sitting - Scoot to Edge of Bed: 4: Min assist Transfers Transfers: Stand to Sit;Sit to Stand Sit to Stand: 4: Min guard Stand to Sit: 4: Min guard Ambulation/Gait Ambulation/Gait Assistance: 4: Min guard Ambulation Distance (Feet): 45 Feet Assistive device: Rolling walker Gait Pattern: Within Functional Limits Gait velocity: WNL    Exercises     PT Diagnosis: Difficulty walking  PT Problem List: Decreased activity tolerance;Decreased mobility;Decreased knowledge of use of DME PT Treatment Interventions: DME instruction;Gait training;Stair training;Functional mobility training;Therapeutic activities;Therapeutic exercise   PT Goals Acute Rehab PT Goals PT Goal Formulation: With patient/family Time For Goal Achievement: 05/31/12 Potential to Achieve Goals: Good Pt will go Supine/Side to Sit: with modified independence PT Goal: Supine/Side to Sit - Progress: Goal set today Pt will go Sit to Stand: with modified  independence PT Goal: Sit to Stand - Progress: Goal set today Pt will Ambulate: >150 feet;with supervision;with rolling walker PT Goal: Ambulate - Progress: Goal set today Pt will Go Up / Down Stairs: 1-2 stairs;with supervision PT Goal: Up/Down Stairs - Progress: Goal set today  Visit Information  Last PT Received On: 05/24/12 Assistance Needed: +2 (for safety only due to language barrier)    Subjective Data  Subjective: Pt speaks [place].  PT spoke with son on phone re: PLOF. Patient Stated Goal: Pt agreeable to get up to chair initially due to feeling weak in legs, but once standing, pt ambulated.   Prior Functioning  Home Living Lives With: Spouse;Son Available Help at Discharge: Family Type of Home: House Home Access: Stairs to enter Secretary/administrator of Steps: 2 Home Layout: One level Prior Function Level of Independence: Independent Able to Take Stairs?: Yes Vocation: Retired Musician:  (Speaks [place]  Family gave info on PLOF)    Cognition  Overall Cognitive Status: Difficult to assess Difficult to assess due to: Non-English speaking Arousal/Alertness: Awake/alert Behavior During Session: [facility] for tasks performed    Extremity/Trunk Assessment Right Lower Extremity Assessment RLE ROM/Strength/Tone: [facility] for tasks assessed Left Lower Extremity Assessment LLE ROM/Strength/Tone: [facility] for tasks assessed   Balance    End of Session PT - End of Session Equipment Utilized During Treatment: Gait belt Activity Tolerance: Patient tolerated treatment well Patient left: in chair;with call bell/phone within reach;with family/visitor present Nurse Communication: Mobility status  GP     Euleta Belson LUBECK 05/24/2012, 11:17 AM

## 2012-05-24 NOTE — Consult Note (Signed)
Reason for Consult: Pre op clearance Referring Physician:   [name] is an 71 y.o. male.  HPI:   The patient is a 71 year old [place] male with a history of diabetes mellitus, hypertension, hyperlipidemia, dysphasia. He apparently has never used tobacco products.  He was admitted with pancreatitis, acute kidney injury, nausea, vomiting.  CT scan showed cholelithiasis with stones in the distal common bile duct. ERCP was completed on June 27 with removal of a common bile duct stone.  We have been asked to see the patient for preop clearance for possible cholecystectomy.  Lipase is greater than 3000. LFTs were elevated. Both of which are improving.  Patient and his wife do not speak [place].  The grandson provided some translation. The patient has never had a heart attack. He denies chest pain, shortness of breath, abdominal pain, lower extremity edema, palpitations. He does complain of some nausea.  Past Medical History  Diagnosis Date  . Diabetes mellitus   . HTN (hypertension)   . HLD (hyperlipidemia)   . Anginal pain   . Dysphagia   . Neuromuscular disorder     muscle pain in arms    Past Surgical History  Procedure Date  . No past surgeries   . Ercp 05/22/2012    Procedure: ENDOSCOPIC RETROGRADE CHOLANGIOPANCREATOGRAPHY (ERCP);  Surgeon: Theda Belfast, MD;  Location: [facility];  Service: Endoscopy;  Laterality: N/A;    History reviewed. No pertinent family history.  Social History:  reports that he has quit smoking. His smoking use included Cigarettes. He has never used smokeless tobacco. He reports that he drinks alcohol. He reports that he does not use illicit drugs.  Allergies: No Known Allergies  Medications:    . amLODipine  5 mg Oral Daily  . ciprofloxacin  500 mg Oral BID  . ertapenem  1 g Intravenous Q24H  . insulin aspart  0-15 Units Subcutaneous TID WC  . insulin aspart  0-5 Units Subcutaneous QHS  . lisinopril  40 mg Oral Daily  . senna-docusate  1 tablet Oral  BID  . sodium chloride  3 mL Intravenous Q12H  . DISCONTD: lisinopril  20 mg Oral Daily     Results for orders placed during the hospital encounter of 05/21/12 (from the past 48 hour(s))  GLUCOSE, CAPILLARY     Status: Abnormal   Collection Time   05/22/12  5:12 PM      Component Value Range Comment   Glucose-Capillary 131 (*) 70 - 99 mg/dL   GLUCOSE, CAPILLARY     Status: Abnormal   Collection Time   05/22/12  9:48 PM      Component Value Range Comment   Glucose-Capillary 69 (*) 70 - 99 mg/dL   GLUCOSE, CAPILLARY     Status: Abnormal   Collection Time   05/22/12 10:25 PM      Component Value Range Comment   Glucose-Capillary 120 (*) 70 - 99 mg/dL   COMPREHENSIVE METABOLIC PANEL     Status: Abnormal   Collection Time   05/23/12  4:34 AM      Component Value Range Comment   Sodium 134 (*) 135 - 145 mEq/L    Potassium 3.5  3.5 - 5.1 mEq/L    Chloride 108  96 - 112 mEq/L    CO2 17 (*) 19 - 32 mEq/L    Glucose, Bld 86  70 - 99 mg/dL    BUN 33 (*) 6 - 23 mg/dL    Creatinine, Ser 2.95 (*) 0.50 -  1.35 mg/dL    Calcium 7.5 (*) 8.4 - 10.5 mg/dL    Total Protein 6.0  6.0 - 8.3 g/dL    Albumin 2.4 (*) 3.5 - 5.2 g/dL    AST 981 (*) 0 - 37 U/L    ALT 497 (*) 0 - 53 U/L    Alkaline Phosphatase 121 (*) 39 - 117 U/L    Total Bilirubin 2.9 (*) 0.3 - 1.2 mg/dL    GFR calc non Af Amer 38 (*) >90 mL/min    GFR calc Af Amer 45 (*) >90 mL/min   CBC     Status: Abnormal   Collection Time   05/23/12  4:34 AM      Component Value Range Comment   WBC 8.5  4.0 - 10.5 K/uL    RBC 3.38 (*) 4.22 - 5.81 MIL/uL    Hemoglobin 10.1 (*) 13.0 - 17.0 g/dL    HCT 19.1 (*) 47.8 - 52.0 %    MCV 88.2  78.0 - 100.0 fL    MCH 29.9  26.0 - 34.0 pg    MCHC 33.9  30.0 - 36.0 g/dL    RDW 29.5  62.1 - 30.8 %    Platelets 62 (*) 150 - 400 K/uL   LIPASE, BLOOD     Status: Abnormal   Collection Time   05/23/12  4:34 AM      Component Value Range Comment   Lipase 475 (*) 11 - 59 U/L   GLUCOSE, CAPILLARY      Status: Abnormal   Collection Time   05/23/12  8:19 AM      Component Value Range Comment   Glucose-Capillary 137 (*) 70 - 99 mg/dL   AFP TUMOR MARKER     Status: Normal   Collection Time   05/23/12  8:35 AM      Component Value Range Comment   AFP-Tumor Marker <1.3  0.0 - 8.0 ng/mL   GLUCOSE, CAPILLARY     Status: Abnormal   Collection Time   05/23/12 11:23 AM      Component Value Range Comment   Glucose-Capillary 112 (*) 70 - 99 mg/dL   GLUCOSE, CAPILLARY     Status: Abnormal   Collection Time   05/23/12  5:48 PM      Component Value Range Comment   Glucose-Capillary 144 (*) 70 - 99 mg/dL   GLUCOSE, CAPILLARY     Status: Abnormal   Collection Time   05/23/12 10:52 PM      Component Value Range Comment   Glucose-Capillary 154 (*) 70 - 99 mg/dL   COMPREHENSIVE METABOLIC PANEL     Status: Abnormal   Collection Time   05/24/12  4:33 AM      Component Value Range Comment   Sodium 132 (*) 135 - 145 mEq/L    Potassium 3.6  3.5 - 5.1 mEq/L    Chloride 108  96 - 112 mEq/L    CO2 18 (*) 19 - 32 mEq/L    Glucose, Bld 114 (*) 70 - 99 mg/dL    BUN 23  6 - 23 mg/dL    Creatinine, Ser 6.57  0.50 - 1.35 mg/dL DELTA CHECK NOTED   Calcium 7.7 (*) 8.4 - 10.5 mg/dL    Total Protein 5.9 (*) 6.0 - 8.3 g/dL    Albumin 2.1 (*) 3.5 - 5.2 g/dL    AST 846 (*) 0 - 37 U/L    ALT 324 (*) 0 - 53 U/L  Alkaline Phosphatase 146 (*) 39 - 117 U/L    Total Bilirubin 3.0 (*) 0.3 - 1.2 mg/dL    GFR calc non Af Amer 66 (*) >90 mL/min    GFR calc Af Amer 77 (*) >90 mL/min   LIPASE, BLOOD     Status: Abnormal   Collection Time   05/24/12  4:33 AM      Component Value Range Comment   Lipase 441 (*) 11 - 59 U/L   CBC     Status: Abnormal   Collection Time   05/24/12  4:33 AM      Component Value Range Comment   WBC 7.3  4.0 - 10.5 K/uL    RBC 3.40 (*) 4.22 - 5.81 MIL/uL    Hemoglobin 10.3 (*) 13.0 - 17.0 g/dL    HCT 40.9 (*) 81.1 - 52.0 %    MCV 86.5  78.0 - 100.0 fL    MCH 30.3  26.0 - 34.0 pg    MCHC  35.0  30.0 - 36.0 g/dL    RDW 91.4  78.2 - 95.6 %    Platelets 66 (*) 150 - 400 K/uL CONSISTENT WITH PREVIOUS RESULT  GLUCOSE, CAPILLARY     Status: Normal   Collection Time   05/24/12  7:36 AM      Component Value Range Comment   Glucose-Capillary 97  70 - 99 mg/dL   PROTIME-INR     Status: Normal   Collection Time   05/24/12 11:32 AM      Component Value Range Comment   Prothrombin Time 12.9  11.6 - 15.2 seconds    INR 0.95  0.00 - 1.49   GLUCOSE, CAPILLARY     Status: Abnormal   Collection Time   05/24/12 11:33 AM      Component Value Range Comment   Glucose-Capillary 190 (*) 70 - 99 mg/dL   GLUCOSE, CAPILLARY     Status: Abnormal   Collection Time   05/24/12  4:44 PM      Component Value Range Comment   Glucose-Capillary 141 (*) 70 - 99 mg/dL     Mr Abdomen Wo Contrast  05/23/2012  *RADIOLOGY REPORT*  Clinical Data: Abnormal CT.  The patient gallstone pancreatitis.  MRI ABDOMEN WITHOUT CONTRAST  Technique:  Multiplanar multisequence MR imaging of the abdomen was performed. No intravenous contrast was administered.  Comparison: CT 05/22/2012, ERCP 05/22/2012  Findings: In small bilateral pleural effusions present.  No pericardial fluid.  No discrete lesions within the liver on the non contrast pulse sequences.  Liver does  however have a fine nodular contour best seen on the coronal T2-weighted imaging along the right hepatic margin.  There is no intrahepatic biliary ductal dilatation.  There are large gallstones within the gallbladder.  Stones  measure up to 1.7 cm.  There is gallbladder wall thickening up to 9 mm.  The common bile duct appears normal following ERCP and stone extraction.  No filling defects in the common bile duct are evident.  The pancreas and pancreatic duct are normal.  There is small amount of fluid inflammation along the pancreatic body and tail with small of fluid collecting along the anterior left pararenal fascia.  No organized fluid collections.  The spleen is  normal.  The portal vein and splenic vein.  patent this noncontrast exam.  The adrenal glands and kidneys are normal.  IMPRESSION:  1.  Nodular liver suggest cirrhosis.  No focal lesions to suggest hepatoma or metastasis. 2.  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  3. Small of fluid along the tail the pancreas and mild peripancreatic edema consistent with mild pancreatitis.  No organized fluid collections. 4.  Common bile duct appears normal without evidence of filling defect following ERCP and stone extraction.  Original Report Authenticated By: Genevive Bi, M.D.    Review of Systems  Constitutional: Negative for fever.  Respiratory: Negative for cough and shortness of breath.   Cardiovascular: Negative for chest pain, palpitations, orthopnea and leg swelling.  Gastrointestinal: Positive for nausea and vomiting. Negative for abdominal pain.   Blood pressure 159/80, pulse 62, temperature 98.4 F (36.9 C), temperature source Oral, resp. rate 16, height 5\' 6"  (1.676 m), weight 54.432 kg (120 lb), SpO2 97.00%. Physical Exam  Constitutional: He appears well-developed and well-nourished. No distress.  HENT:  Head: Normocephalic and atraumatic.  Eyes: EOM are normal. Pupils are equal, round, and reactive to light.  Neck: Normal range of motion. Neck supple.  Cardiovascular: Normal rate and regular rhythm.   No murmur heard. Pulses:      Radial pulses are 2+ on the right side, and 2+ on the left side.       Dorsalis pedis pulses are 2+ on the right side, and 2+ on the left side.       No carotid bruit.  Respiratory: Effort normal and breath sounds normal. He has no wheezes. He has no rales.  GI: Bowel sounds are normal. There is no tenderness.       Abdomen is somewhat tense  Musculoskeletal: He exhibits no edema.  Lymphadenopathy:    He has no cervical adenopathy.  Neurological: He is alert. He exhibits normal muscle tone.  Skin: Skin is warm and dry.    Psychiatric: He has a normal mood and affect.    Assessment/Plan: Patient Active Hospital Problem List: Nausea & vomiting (05/22/2012) Diarrhea (05/22/2012) Hyponatremia (05/22/2012) Pancreatitis (05/22/2012) AKI (acute kidney injury) (05/22/2012) Fever (05/22/2012) Chills (05/22/2012) HTN (hypertension) (05/22/2012) Thrombocytopenia (05/22/2012)  Plan:  A 2-D echocardiogram is pending.  I have ordered an EKG.  Serum creatinine is improved.  LFTs and lipase improving. Adding hydralazine for better BP control. Cardiologist's Opinion to follow.   HAGER, Christian Wallace 05/24/2012, 4:59 PM      Agree with note written by [facility]  ATSP for pre-op clearance prior to cholecystectomy. No prior cardiac history. Never smoked. Family says he denies CP (only speaks [place] EKG w/o acute changes. + CRF including HTN, DM, hyperlipidemia. Exam notable for right carotid bruit. 2D pending. If LV function nl w/o focal WMA would proceed with surgery at low risk. Would not get a functional study at this point to risk stratify. Will follow along with you.  Runell Gess 05/24/2012 6:44 PM

## 2012-05-24 NOTE — Progress Notes (Signed)
[facility] PROGRESS NOTE  Christian Wallace ZOX:[id number] DOB: 08/16/1941 DOA: 05/21/2012 PCP: No primary provider on file.  Assessment/Plan: 1. CBS stone- s/p ERCP, clears- advance per GI, feeling better, tolerating clears  2. Gram negative rods in blood cultures most likely septic from CBD stone- IV abx, await speciation   3. Nausea & vomiting- resolved   4. Diarrhea- resolved   5. Hyponatremia- IVF d/c'd  6. Pancreatitis- lipase > 3000 on admission, lipase decreased, abdominal pain better   7. AKI (acute kidney injury) - IVF, improved- old labs point to CKD but Cr stable WNL now  8. DM- SSI, hold metformin and glipizide  9. Fever- abx , seems to be resolving  10. Chills- abx , resolved  11. HTN (hypertension)- restart lisinopril- increase dose as BP still high and add norvasc   12. Thrombocytopenia- ? related to infection, monitor- d/c heparin and give SCDs  13. Falls- consult PT for eval  14.  ?liver spots on CT scan- order alpha feta protein,  MRI of the abdomen without contrast- suggest cirrhosis   per Dr. Elnoria Howard:  suggestion of cirrhosis on the noncontrast scan with the possibility of hepatomas. Since he is [place] HBV is a high consideration. Additionally, a noncontrast MRI scan was recommended for further work up. Radiology mentioned that his gallbladder appeared to be "sick", but clinically he is well- surgery consulted (want cardiac clearance by cardiology per nursing)- check echo, consult in AM   Code Status: full Family Communication: family at bedside and on phone Disposition Plan: home possibly with home health at D/C (once abx changed to PO)- lives with wife and 2 kids   [name], D.O.  [facility] Pager [phone]  05/24/2012, 8:02 AM  LOS: 3 days    Consultants:  Elnoria Howard (GI)  Procedures:  ERCP   Subjective: Doing well, no new c/o No fever/no chills Eating better   Objective: Filed Vitals:   05/23/12 0610 05/23/12  1402 05/23/12 2200 05/24/12 0629  BP: 153/77 157/79 175/86 170/77  Pulse: 95 69 71 63  Temp: 99.7 F (37.6 C) 98.3 F (36.8 C) 98.3 F (36.8 C) 97.8 F (36.6 C)  TempSrc: Oral Oral Oral Oral  Resp: 18 18 18 18   Height:      Weight:      SpO2: 95% 96% 94% 96%    Intake/Output Summary (Last 24 hours) at 05/24/12 0802 Last data filed at 05/23/12 1751  Gross per 24 hour  Intake 1448.75 ml  Output      0 ml  Net 1448.75 ml    Exam:  General: Awake, Oriented x3, No acute distress.  HEENT: EOMI, moist mucous membranes  Neck: Supple  CV: S1 and S2, rrr Lungs: Clear to ascultation bilaterally, no wheezing  Abdomen: Soft, decreased tenderness RUQ and epigastric,  Not distended, +bowel sounds.  Ext: Good pulses. Trace edema. No clubbing or cyanosis noted.  Neuro: Cranial Nerves II-XII grossly intact. Has 5/5 motor strength in upper and lower extremities.   Data Reviewed: Basic Metabolic Panel:  Lab 05/24/12 8295 05/23/12 0434 05/22/12 0010  NA 132* 134* 133*  K 3.6 3.5 --  CL 108 108 97  CO2 18* 17* 16*  GLUCOSE 114* 86 265*  BUN 23 33* 41*  CREATININE 1.08 1.69* 2.97*  CALCIUM 7.7* 7.5* 9.0  MG -- -- --  PHOS -- -- --   Liver Function Tests:  Lab 05/24/12 0433 05/23/12 0434 05/22/12 0010  AST 191* 470* 224*  ALT 324*  497* 277*  ALKPHOS 146* 121* 191*  BILITOT 3.0* 2.9* 4.3*  PROT 5.9* 6.0 7.7  ALBUMIN 2.1* 2.4* 3.3*    Lab 05/24/12 0433 05/23/12 0434 05/22/12 0010  LIPASE 441* 475* >3000*  AMYLASE -- -- --   No results found for this basename: AMMONIA:5 in the last 168 hours CBC:  Lab 05/24/12 0433 05/23/12 0434 05/22/12 0010  WBC 7.3 8.5 4.8  NEUTROABS -- -- 3.7  HGB 10.3* 10.1* 12.6*  HCT 29.4* 29.8* 36.1*  MCV 86.5 88.2 87.2  PLT 66* 62* 118*   Cardiac Enzymes: No results found for this basename: CKTOTAL:5,CKMB:5,CKMBINDEX:5,TROPONINI:5 in the last 168 hours BNP: No components found with this basename: POCBNP:5 CBG:  Lab 05/24/12 0736  05/23/12 2252 05/23/12 1748 05/23/12 1123 05/23/12 0819  GLUCAP 97 154* 144* 112* 137*    Recent Results (from the past 240 hour(s))  CULTURE, BLOOD (ROUTINE X 2)     Status: Normal (Preliminary result)   Collection Time   05/22/12 12:10 AM      Component Value Range Status Comment   Specimen Description BLOOD RIGHT ANTECUBITAL   Final    Special Requests BOTTLES DRAWN AEROBIC AND ANAEROBIC   Final    Culture  Setup Time [id number]   Final    Culture     Final    Value: GRAM NEGATIVE RODS     Note: Gram Stain Report Called to,Read Back By and Verified With: TRIPLIN S @ 2120 ON 05/22/12 BY GOLLD   Report Status PENDING   Incomplete   CULTURE, BLOOD (ROUTINE X 2)     Status: Normal (Preliminary result)   Collection Time   05/22/12 12:15 AM      Component Value Range Status Comment   Specimen Description BLOOD LEFT ANTECUBITAL   Final    Special Requests BOTTLES DRAWN AEROBIC AND ANAEROBIC   Final    Culture  Setup Time [id number]   Final    Culture     Final    Value: GRAM NEGATIVE RODS     Note: Gram Stain Report Called to,Read Back By and Verified With: TRIPLIN S @ 2120 ON 05/22/12 BY GOLLD   Report Status PENDING   Incomplete      Studies: Ct Abdomen Pelvis Wo Contrast  05/22/2012  **ADDENDUM** CREATED: 05/22/2012 16:38:54  The liver has a nodular contour with ill-defined low density potential lesions.  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  Recommend MRI of the abdomen without contrast (patient's creatinine elevated).  If the  patient is to sick  to undergo MRI which requires breath-holding, recommend ultrasound of the liver.  Also consider serum alpha-fetoprotein analysis.  Initial dictation by Dr. Andria Meuse and addendum by Dr. Amil Amen  Findings personally  conveyed to  Dr. Elnoria Howard  05/22/2012 and 1630 hours.  **END ADDENDUM** SIGNED BY: Genevive Bi, M.D.   05/22/2012  *RADIOLOGY REPORT*  Clinical Data: Nausea, vomiting, diarrhea, body aches and chills. Elevated  lipase.  CT ABDOMEN AND PELVIS WITHOUT CONTRAST  Technique:  Multidetector CT imaging of the abdomen and pelvis was performed following the standard protocol without intravenous contrast.  Comparison: None.  Findings: Atelectasis or infiltration in the lung bases.  Cardiac enlargement.  Coronary artery calcification.  The unenhanced appearance of the liver, spleen, adrenal glands, kidneys, and retroperitoneal lymph nodes is unremarkable.  Stones in the gallbladder without obvious gallbladder wall thickening or inflammatory change.  No significant bile duct dilatation. Prominence of the ampulla which could represent mass or  inflammation.  There is a high density in the ampulla consistent with a stone in the distal common bile duct.  No significant pancreatic ductal dilatation.  Mild infiltration in the peripancreatic fat vertically around the tail which might represent changes of pancreatitis.  No free fluid or free air in the abdomen. Calcification of the abdominal aorta without aneurysm.  The stomach, small bowel, and colon are not abnormally distended.  Pelvis:  The prostate gland is mildly enlarged, measuring 4.6 x 3.4 cm.  Bladder wall is not thickened.  No free or loculated pelvic fluid collections.  No diverticulitis.  The appendix is normal.  No significant lymphadenopathy in the pelvis.  Degenerative changes in the lumbar spine.  IMPRESSION: Cholelithiasis with apparent stone in the distal common bile duct. No significant bile duct dilatation.  There is prominent soft tissue in the anterior region which might be due to inflammation or mass.  Mild infiltration around the pancreas suggesting mild pancreatitis.  Original Report Authenticated By: Genevive Bi, M.D.   Dg Chest 2 View  05/22/2012  *RADIOLOGY REPORT*  Clinical Data: Fever and nausea.  CHEST - 2 VIEW  Comparison: None.  Findings: Shallow inspiration.  Linear atelectasis or infiltration in the lung bases.  Normal heart size and pulmonary  vascularity. No blunting of costophrenic angles.  No pneumothorax. Calcification of the aorta.  The mild degenerative changes in the spine.  IMPRESSION: Shallow inspiration.  Linear atelectasis or infiltration in the lung bases.  Original Report Authenticated By: Marlon Pel, M.D.   Dg Ercp With Sphincterotomy  05/22/2012  *RADIOLOGY REPORT*  Clinical Data: Common bile duct stone  ERCP with sphincterotomy  Comparison:  CT 05/22/2012  Technique:  Multiple spot images obtained with the fluoroscopic device and submitted for interpretation post-procedure.  ERCP was performed by Dr. Melvyn Neth.  Findings: Three images are provided.  Initial image demonstrates an endoscope within the duodenum with cannulation of the common bile duct following sphincterotomy.  The balloon sweep was performed with stone extraction.  Final image demonstrates emptying of contrast in the common bile duct.  IMPRESSION: ERCP with sphincterotomy, balloon sweep, and stone extraction.  Upon review of the CT exam, the liver has a nodular contour with ill-defined low density potential lesions.  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  Recommend MRI of the abdomen without contrast (patient's creatinine elevated).  If the  patient is to sick  to undergo MRI which requires breath-holding, recommend ultrasound of the liver.  Findings conveyed to Dr. Elnoria Howard on 05/22/2012 at 1630 hours.  These images were submitted for radiologic interpretation only. Please see the procedural report for the amount of contrast and the fluoroscopy time utilized.  Original Report Authenticated By: Genevive Bi, M.D.    Scheduled Meds:    . ertapenem  1 g Intravenous Q24H  . insulin aspart  0-15 Units Subcutaneous TID WC  . insulin aspart  0-5 Units Subcutaneous QHS  . lisinopril  20 mg Oral Daily  . sodium chloride  3 mL Intravenous Q12H  . DISCONTD: heparin  5,000 Units Subcutaneous Q8H   Continuous Infusions:    . sodium chloride 75 mL/hr at  05/23/12 2019

## 2012-05-24 NOTE — Progress Notes (Signed)
2 Days Post-Op  Subjective: No complaints  Objective: Vital signs in last 24 hours: Temp:  [97.8 F (36.6 C)-98.3 F (36.8 C)] 97.8 F (36.6 C) (06/29 0629) Pulse Rate:  [63-71] 63  (06/29 0629) Resp:  [18] 18  (06/29 0629) BP: (157-175)/(77-86) 170/77 mmHg (06/29 0629) SpO2:  [94 %-96 %] 96 % (06/29 0629) Last BM Date: 05/21/12  Intake/Output from previous day: 06/28 0701 - 06/29 0700 In: 1448.8 [P.O.:480; I.V.:968.8] Out: -  Intake/Output this shift: Total I/O In: 3 [I.V.:3] Out: -   GI: soft, nontender  Lab Results:   [facility] 05/24/12 0433 05/23/12 0434  WBC 7.3 8.5  HGB 10.3* 10.1*  HCT 29.4* 29.8*  PLT 66* 62*   BMET  Basename 05/24/12 0433 05/23/12 0434  NA 132* 134*  K 3.6 3.5  CL 108 108  CO2 18* 17*  GLUCOSE 114* 86  BUN 23 33*  CREATININE 1.08 1.69*  CALCIUM 7.7* 7.5*   PT/INR No results found for this basename: LABPROT:2,INR:2 in the last 72 hours ABG No results found for this basename: PHART:2,PCO2:2,PO2:2,HCO3:2 in the last 72 hours  Studies/Results: Mr Abdomen Wo Contrast  05/23/2012  *RADIOLOGY REPORT*  Clinical Data: Abnormal CT.  The patient gallstone pancreatitis.  MRI ABDOMEN WITHOUT CONTRAST  Technique:  Multiplanar multisequence MR imaging of the abdomen was performed. No intravenous contrast was administered.  Comparison: CT 05/22/2012, ERCP 05/22/2012  Findings: In small bilateral pleural effusions present.  No pericardial fluid.  No discrete lesions within the liver on the non contrast pulse sequences.  Liver does  however have a fine nodular contour best seen on the coronal T2-weighted imaging along the right hepatic margin.  There is no intrahepatic biliary ductal dilatation.  There are large gallstones within the gallbladder.  Stones  measure up to 1.7 cm.  There is gallbladder wall thickening up to 9 mm.  The common bile duct appears normal following ERCP and stone extraction.  No filling defects in the common bile duct are evident.   The pancreas and pancreatic duct are normal.  There is small amount of fluid inflammation along the pancreatic body and tail with small of fluid collecting along the anterior left pararenal fascia.  No organized fluid collections.  The spleen is normal.  The portal vein and splenic vein.  patent this noncontrast exam.  The adrenal glands and kidneys are normal.  IMPRESSION:  1.  Nodular liver suggest cirrhosis.  No focal lesions to suggest hepatoma or metastasis. 2.  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  3. Small of fluid along the tail the pancreas and mild peripancreatic edema consistent with mild pancreatitis.  No organized fluid collections. 4.  Common bile duct appears normal without evidence of filling defect following ERCP and stone extraction.  Original Report Authenticated By: Genevive Bi, M.D.   Dg Ercp With Sphincterotomy  05/22/2012  *RADIOLOGY REPORT*  Clinical Data: Common bile duct stone  ERCP with sphincterotomy  Comparison:  CT 05/22/2012  Technique:  Multiple spot images obtained with the fluoroscopic device and submitted for interpretation post-procedure.  ERCP was performed by Dr. Melvyn Neth.  Findings: Three images are provided.  Initial image demonstrates an endoscope within the duodenum with cannulation of the common bile duct following sphincterotomy.  The balloon sweep was performed with stone extraction.  Final image demonstrates emptying of contrast in the common bile duct.  IMPRESSION: ERCP with sphincterotomy, balloon sweep, and stone extraction.  Upon review of the CT exam, the liver has  a nodular contour with ill-defined low density potential lesions.  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  Recommend MRI of the abdomen without contrast (patient's creatinine elevated).  If the  patient is to sick  to undergo MRI which requires breath-holding, recommend ultrasound of the liver.  Findings conveyed to Dr. Elnoria Howard on 05/22/2012 at 1630 hours.   These images were submitted for radiologic interpretation only. Please see the procedural report for the amount of contrast and the fluoroscopy time utilized.  Original Report Authenticated By: Genevive Bi, M.D.    Anti-infectives: Anti-infectives     Start     Dose/Rate Route Frequency Ordered Stop   05/24/12 1015   ciprofloxacin (CIPRO) tablet 500 mg        500 mg Oral 2 times daily 05/24/12 0923     05/23/12 0600   ertapenem (INVANZ) 1 g in sodium chloride 0.9 % 50 mL IVPB        1 g 100 mL/hr over 30 Minutes Intravenous Every 24 hours 05/22/12 1215     05/22/12 1500   ampicillin-sulbactam (UNASYN) 1.5 g in sodium chloride 0.9 % 50 mL IVPB        1.5 g 100 mL/hr over 30 Minutes Intravenous  Once 05/22/12 1457 05/22/12 1527   05/22/12 0615   ertapenem (INVANZ) 1 g in sodium chloride 0.9 % 50 mL IVPB        1 g 100 mL/hr over 30 Minutes Intravenous  Once 05/22/12 0609 05/22/12 1478          Assessment/Plan: s/p Procedure(s) (LRB): ENDOSCOPIC RETROGRADE CHOLANGIOPANCREATOGRAPHY (ERCP) (N/A) Check INR given his liver disease Await cardiac clearance  LOS: 3 days    TOTH III,Jocelynn Gioffre S 05/24/2012

## 2012-05-25 DIAGNOSIS — K8001 Calculus of gallbladder with acute cholecystitis with obstruction: Secondary | ICD-10-CM

## 2012-05-25 DIAGNOSIS — N179 Acute kidney failure, unspecified: Secondary | ICD-10-CM

## 2012-05-25 DIAGNOSIS — R112 Nausea with vomiting, unspecified: Secondary | ICD-10-CM

## 2012-05-25 DIAGNOSIS — E876 Hypokalemia: Secondary | ICD-10-CM

## 2012-05-25 LAB — CBC
HCT: 30.2 % — ABNORMAL LOW (ref 39.0–52.0)
MCH: 30 pg (ref 26.0–34.0)
MCV: 85.6 fL (ref 78.0–100.0)
RBC: 3.53 MIL/uL — ABNORMAL LOW (ref 4.22–5.81)
RDW: 13.6 % (ref 11.5–15.5)
WBC: 6.3 10*3/uL (ref 4.0–10.5)

## 2012-05-25 LAB — CULTURE, BLOOD (ROUTINE X 2)

## 2012-05-25 LAB — COMPREHENSIVE METABOLIC PANEL
AST: 116 U/L — ABNORMAL HIGH (ref 0–37)
Albumin: 2 g/dL — ABNORMAL LOW (ref 3.5–5.2)
Alkaline Phosphatase: 186 U/L — ABNORMAL HIGH (ref 39–117)
BUN: 18 mg/dL (ref 6–23)
Chloride: 106 mEq/L (ref 96–112)
Potassium: 3.5 mEq/L (ref 3.5–5.1)
Total Protein: 6 g/dL (ref 6.0–8.3)

## 2012-05-25 LAB — URINE CULTURE

## 2012-05-25 LAB — GLUCOSE, CAPILLARY
Glucose-Capillary: 162 mg/dL — ABNORMAL HIGH (ref 70–99)
Glucose-Capillary: 201 mg/dL — ABNORMAL HIGH (ref 70–99)
Glucose-Capillary: 128 mg/dL — ABNORMAL HIGH (ref 70–99)

## 2012-05-25 NOTE — Progress Notes (Signed)
[facility] PROGRESS NOTE  Christian Wallace NWG:[id number] DOB: Feb 11, 1941 DOA: 05/21/2012 PCP: No primary provider on file.  Assessment/Plan: 1. CBS stone- s/p ERCP, advance diet per GI, feeling better, no nausea  2. Gram negative rods in blood cultures most likely septic from CBD stone- cipro PO- D/C invanz, await speciation by ID lab   3. Nausea & vomiting- resolved   4. Diarrhea- resolved   5. Hyponatremia- IVF d/c'd  6. Pancreatitis- lipase > 3000 on admission, lipase decreased, abdominal pain better   7. AKI (acute kidney injury) - IVF, improved- old labs point to CKD but Cr stable WNL now  8. DM- SSI, hold metformin and glipizide  9. Fever- abx , seems to be resolving  10. Chills- abx , resolved  11. HTN (hypertension)- restart lisinopril- increase dose as BP still high and add norvasc and PRN medication  12. Thrombocytopenia- ? related to infection, monitor- d/c heparin and give SCDs  13. Falls- consult PT for eval- recommend home health  14.  ?liver spots on CT scan- order alpha feta protein WNL,  MRI of the abdomen without contrast- suggest cirrhosis   per Dr. Elnoria Howard:  suggestion of cirrhosis on the noncontrast scan with the possibility of hepatomas. Since he is [place] HBV is a high consideration. Additionally, a noncontrast MRI scan was recommended for further work up. Radiology mentioned that his gallbladder appeared to be "sick", but clinically he is well- surgery consulted, cardiac clearance needing echo.   Code Status: full Family Communication: family at bedside and on phone Disposition Plan: home possibly with home health at D/C - lives with wife and 2 kids   [name], D.O.  [facility] Pager [phone]  05/25/2012, 8:12 AM  LOS: 4 days    Consultants:  Elnoria Howard (GI)  Procedures:  ERCP   Subjective: Still feeling well.  Family translated at bedside Eating ok    Objective: Filed Vitals:   05/24/12 0629 05/24/12 1410  05/24/12 2130 05/25/12 0452  BP: 170/77 159/80 172/84 197/80  Pulse: 63 62 74 64  Temp: 97.8 F (36.6 C) 98.4 F (36.9 C) 97.7 F (36.5 C) 99.3 F (37.4 C)  TempSrc: Oral Oral Oral Oral  Resp: 18 16 16 17   Height:      Weight:      SpO2: 96% 97% 96% 97%    Intake/Output Summary (Last 24 hours) at 05/25/12 2952 Last data filed at 05/25/12 0600  Gross per 24 hour  Intake   1203 ml  Output      0 ml  Net   1203 ml    Exam:  General: Awake, Oriented x3, No acute distress.  HEENT: EOMI, moist mucous membranes  Neck: Supple  CV: S1 and S2, rrr Lungs: Clear to ascultation bilaterally, no wheezing  Abdomen: Soft, decreased tenderness RUQ and epigastric,  Not distended, +bowel sounds.  Ext: Good pulses. Trace edema. No clubbing or cyanosis noted.  Neuro: Cranial Nerves II-XII grossly intact. Has 5/5 motor strength in upper and lower extremities.   Data Reviewed: Basic Metabolic Panel:  Lab 05/25/12 8413 05/24/12 0433 05/23/12 0434 05/22/12 0010  NA 133* 132* 134* 133*  K 3.5 3.6 -- --  CL 106 108 108 97  CO2 18* 18* 17* 16*  GLUCOSE 134* 114* 86 265*  BUN 18 23 33* 41*  CREATININE 1.07 1.08 1.69* 2.97*  CALCIUM 7.9* 7.7* 7.5* 9.0  MG -- -- -- --  PHOS -- -- -- --   Liver Function Tests:  Lab 05/25/12 0449 05/24/12 0433 05/23/12 0434 05/22/12 0010  AST 116* 191* 470* 224*  ALT 234* 324* 497* 277*  ALKPHOS 186* 146* 121* 191*  BILITOT 2.3* 3.0* 2.9* 4.3*  PROT 6.0 5.9* 6.0 7.7  ALBUMIN 2.0* 2.1* 2.4* 3.3*    Lab 05/25/12 0449 05/24/12 0433 05/23/12 0434 05/22/12 0010  LIPASE 520* 441* 475* >3000*  AMYLASE -- -- -- --   No results found for this basename: AMMONIA:5 in the last 168 hours CBC:  Lab 05/25/12 0449 05/24/12 0433 05/23/12 0434 05/22/12 0010  WBC 6.3 7.3 8.5 4.8  NEUTROABS -- -- -- 3.7  HGB 10.6* 10.3* 10.1* 12.6*  HCT 30.2* 29.4* 29.8* 36.1*  MCV 85.6 86.5 88.2 87.2  PLT 82* 66* 62* 118*   Cardiac Enzymes: No results found for this basename:  CKTOTAL:5,CKMB:5,CKMBINDEX:5,TROPONINI:5 in the last 168 hours BNP: No components found with this basename: POCBNP:5 CBG:  Lab 05/24/12 2152 05/24/12 1644 05/24/12 1133 05/24/12 0736 05/23/12 2252  GLUCAP 164* 141* 190* 97 154*    Recent Results (from the past 240 hour(s))  CULTURE, BLOOD (ROUTINE X 2)     Status: Normal (Preliminary result)   Collection Time   05/22/12 12:10 AM      Component Value Range Status Comment   Specimen Description BLOOD RIGHT ANTECUBITAL   Final    Special Requests BOTTLES DRAWN AEROBIC AND ANAEROBIC   Final    Culture  Setup Time 05/22/2012 09:10   Final    Culture     Final    Value: GRAM NEGATIVE RODS     Note: Gram Stain Report Called to,Read Back By and Verified With: TRIPLIN S @ 2120 ON 05/22/12 BY GOLLD   Report Status PENDING   Incomplete   CULTURE, BLOOD (ROUTINE X 2)     Status: Normal (Preliminary result)   Collection Time   05/22/12 12:15 AM      Component Value Range Status Comment   Specimen Description BLOOD LEFT ANTECUBITAL   Final    Special Requests BOTTLES DRAWN AEROBIC AND ANAEROBIC   Final    Culture  Setup Time 05/22/2012 09:10   Final    Culture     Final    Value: GRAM NEGATIVE RODS     Note: Gram Stain Report Called to,Read Back By and Verified With: TRIPLIN S @ 2120 ON 05/22/12 BY GOLLD   Report Status PENDING   Incomplete   URINE CULTURE     Status: Normal (Preliminary result)   Collection Time   05/22/12  1:31 AM      Component Value Range Status Comment   Specimen Description URINE, CLEAN CATCH   Final    Special Requests NONE   Final    Culture  Setup Time 05/22/2012 09:22   Final    Colony Count 15,000 COLONIES/ML   Final    Culture ENTEROCOCCUS SPECIES   Final    Report Status PENDING   Incomplete      Studies: Ct Abdomen Pelvis Wo Contrast  05/22/2012  **ADDENDUM** CREATED: 05/22/2012 16:38:54  The liver has a nodular contour with ill-defined low density potential lesions.  Concern for cirrhosis with  underlying hepatomas.  Recommend MRI of the abdomen without contrast (patient's creatinine elevated).  If the  patient is to sick  to undergo MRI which requires breath-holding, recommend ultrasound of the liver.  Also consider serum alpha-fetoprotein analysis.  Initial dictation by Dr. Andria Meuse and addendum by Dr. Amil Amen  Findings personally  conveyed to  Dr. Elnoria Howard  05/22/2012 and 1630 hours.  **END ADDENDUM** SIGNED BY: Genevive Bi, M.D.   05/22/2012  *RADIOLOGY REPORT*  Clinical Data: Nausea, vomiting, diarrhea, body aches and chills. Elevated lipase.  CT ABDOMEN AND PELVIS WITHOUT CONTRAST  Technique:  Multidetector CT imaging of the abdomen and pelvis was performed following the standard protocol without intravenous contrast.  Comparison: None.  Findings: Atelectasis or infiltration in the lung bases.  Cardiac enlargement.  Coronary artery calcification.  The unenhanced appearance of the liver, spleen, adrenal glands, kidneys, and retroperitoneal lymph nodes is unremarkable.  Stones in the gallbladder without obvious gallbladder wall thickening or inflammatory change.  No significant bile duct dilatation. Prominence of the ampulla which could represent mass or inflammation.  There is a high density in the ampulla consistent with a stone in the distal common bile duct.  No significant pancreatic ductal dilatation.  Mild infiltration in the peripancreatic fat vertically around the tail which might represent changes of pancreatitis.  No free fluid or free air in the abdomen. Calcification of the abdominal aorta without aneurysm.  The stomach, small bowel, and colon are not abnormally distended.  Pelvis:  The prostate gland is mildly enlarged, measuring 4.6 x 3.4 cm.  Bladder wall is not thickened.  No free or loculated pelvic fluid collections.  No diverticulitis.  The appendix is normal.  No significant lymphadenopathy in the pelvis.  Degenerative changes in the lumbar spine.  IMPRESSION: Cholelithiasis with  apparent stone in the distal common bile duct. No significant bile duct dilatation.  There is prominent soft tissue in the anterior region which might be due to inflammation or mass.  Mild infiltration around the pancreas suggesting mild pancreatitis.  Original Report Authenticated By: Genevive Bi, M.D.   Dg Chest 2 View  05/22/2012  *RADIOLOGY REPORT*  Clinical Data: Fever and nausea.  CHEST - 2 VIEW  Comparison: None.  Findings: Shallow inspiration.  Linear atelectasis or infiltration in the lung bases.  Normal heart size and pulmonary vascularity. No blunting of costophrenic angles.  No pneumothorax. Calcification of the aorta.  The mild degenerative changes in the spine.  IMPRESSION: Shallow inspiration.  Linear atelectasis or infiltration in the lung bases.  Original Report Authenticated By: Marlon Pel, M.D.   Dg Ercp With Sphincterotomy  05/22/2012  *RADIOLOGY REPORT*  Clinical Data: Common bile duct stone  ERCP with sphincterotomy  Comparison:  CT 05/22/2012  Technique:  Multiple spot images obtained with the fluoroscopic device and submitted for interpretation post-procedure.  ERCP was performed by Dr. Melvyn Neth.  Findings: Three images are provided.  Initial image demonstrates an endoscope within the duodenum with cannulation of the common bile duct following sphincterotomy.  The balloon sweep was performed with stone extraction.  Final image demonstrates emptying of contrast in the common bile duct.  IMPRESSION: ERCP with sphincterotomy, balloon sweep, and stone extraction.  Upon review of the CT exam, the liver has a nodular contour with ill-defined low density potential lesions.  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  Recommend MRI of the abdomen without contrast (patient's creatinine elevated).  If the  patient is to sick  to undergo MRI which requires breath-holding, recommend ultrasound of the liver.  Findings conveyed to Dr. Elnoria Howard on 05/22/2012 at 1630 hours.  These images were  submitted for radiologic interpretation only. Please see the procedural report for the amount of contrast and the fluoroscopy time utilized.  Original Report Authenticated By: Genevive Bi, M.D.    Scheduled Meds:    .  amLODipine  5 mg Oral Daily  . ciprofloxacin  500 mg Oral BID  . ertapenem  1 g Intravenous Q24H  . hydrALAZINE  25 mg Oral Q8H  . insulin aspart  0-15 Units Subcutaneous TID WC  . insulin aspart  0-5 Units Subcutaneous QHS  . lisinopril  40 mg Oral Daily  . senna-docusate  1 tablet Oral BID  . sodium chloride  3 mL Intravenous Q12H   Continuous Infusions:

## 2012-05-25 NOTE — Progress Notes (Addendum)
Cm spoke with patient concerning dc planning. Pt does not speak English, grandson present at bedside helped assist with translation. Per grandson patient's spouse went home to shower. Cm asked patient if he request HHPT upon discharge. Per pt no PT needed, ambulating in hallway with RW, only request RW. [facility] notified of DME referral. No other needs stated.   Leonie Green [phone]

## 2012-05-25 NOTE — Progress Notes (Signed)
3 Days Post-Op  Subjective: No complaints  Objective: Vital signs in last 24 hours: Temp:  [97.7 F (36.5 C)-99.3 F (37.4 C)] 99.3 F (37.4 C) (06/30 0452) Pulse Rate:  [62-74] 64  (06/30 0452) Resp:  [16-17] 17  (06/30 0452) BP: (159-197)/(80-84) 197/80 mmHg (06/30 0452) SpO2:  [96 %-97 %] 97 % (06/30 0452) Last BM Date: 05/24/12  Intake/Output from previous day: 06/29 0701 - 06/30 0700 In: 1203 [P.O.:1200; I.V.:3] Out: -  Intake/Output this shift: Total I/O In: 3 [I.V.:3] Out: -   GI: soft, nontender  Lab Results:   Basename 05/25/12 0449 05/24/12 0433  WBC 6.3 7.3  HGB 10.6* 10.3*  HCT 30.2* 29.4*  PLT 82* 66*   BMET  Basename 05/25/12 0449 05/24/12 0433  NA 133* 132*  K 3.5 3.6  CL 106 108  CO2 18* 18*  GLUCOSE 134* 114*  BUN 18 23  CREATININE 1.07 1.08  CALCIUM 7.9* 7.7*   PT/INR  Basename 05/24/12 1132  LABPROT 12.9  INR 0.95   ABG No results found for this basename: PHART:2,PCO2:2,PO2:2,HCO3:2 in the last 72 hours  Studies/Results: Mr Abdomen Wo Contrast  05/23/2012  *RADIOLOGY REPORT*  Clinical Data: Abnormal CT.  The patient gallstone pancreatitis.  MRI ABDOMEN WITHOUT CONTRAST  Technique:  Multiplanar multisequence MR imaging of the abdomen was performed. No intravenous contrast was administered.  Comparison: CT 05/22/2012, ERCP 05/22/2012  Findings: In small bilateral pleural effusions present.  No pericardial fluid.  No discrete lesions within the liver on the non contrast pulse sequences.  Liver does  however have a fine nodular contour best seen on the coronal T2-weighted imaging along the right hepatic margin.  There is no intrahepatic biliary ductal dilatation.  There are large gallstones within the gallbladder.  Stones  measure up to 1.7 cm.  There is gallbladder wall thickening up to 9 mm.  The common bile duct appears normal following ERCP and stone extraction.  No filling defects in the common bile duct are evident.  The pancreas and  pancreatic duct are normal.  There is small amount of fluid inflammation along the pancreatic body and tail with small of fluid collecting along the anterior left pararenal fascia.  No organized fluid collections.  The spleen is normal.  The portal vein and splenic vein.  patent this noncontrast exam.  The adrenal glands and kidneys are normal.  IMPRESSION:  1.  Nodular liver suggest cirrhosis.  No focal lesions to suggest hepatoma or metastasis. 2.  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  3. Small of fluid along the tail the pancreas and mild peripancreatic edema consistent with mild pancreatitis.  No organized fluid collections. 4.  Common bile duct appears normal without evidence of filling defect following ERCP and stone extraction.  Original Report Authenticated By: Genevive Bi, M.D.    Anti-infectives: Anti-infectives     Start     Dose/Rate Route Frequency Ordered Stop   05/24/12 1015   ciprofloxacin (CIPRO) tablet 500 mg        500 mg Oral 2 times daily 05/24/12 0923     05/23/12 0600   ertapenem (INVANZ) 1 g in sodium chloride 0.9 % 50 mL IVPB  Status:  Discontinued        1 g 100 mL/hr over 30 Minutes Intravenous Every 24 hours 05/22/12 1215 05/25/12 0816   05/22/12 1500   ampicillin-sulbactam (UNASYN) 1.5 g in sodium chloride 0.9 % 50 mL IVPB  1.5 g 100 mL/hr over 30 Minutes Intravenous  Once 05/22/12 1457 05/22/12 1527   05/22/12 0615   ertapenem (INVANZ) 1 g in sodium chloride 0.9 % 50 mL IVPB        1 g 100 mL/hr over 30 Minutes Intravenous  Once 05/22/12 0609 05/22/12 1478          Assessment/Plan: s/p Procedure(s) (LRB): ENDOSCOPIC RETROGRADE CHOLANGIOPANCREATOGRAPHY (ERCP) (N/A) Lipase still up. LFT's still up. will monitor Await cardiac eval  LOS: 4 days    TOTH III,Cerenity Goshorn S 05/25/2012

## 2012-05-25 NOTE — Progress Notes (Signed)
Subjective: No acute events.  Objective: Vital signs in last 24 hours: Temp:  [97.7 F (36.5 C)-99.3 F (37.4 C)] 99.3 F (37.4 C) (06/30 0452) Pulse Rate:  [62-74] 64  (06/30 0452) Resp:  [16-17] 17  (06/30 0452) BP: (159-197)/(80-84) 197/80 mmHg (06/30 0452) SpO2:  [96 %-97 %] 97 % (06/30 0452) Last BM Date: 05/24/12  Intake/Output from previous day: 06/29 0701 - 06/30 0700 In: 1203 [P.O.:1200; I.V.:3] Out: -  Intake/Output this shift:    General appearance: alert and no distress GI: soft, non-tender; bowel sounds normal; no masses,  no organomegaly  Lab Results:  Basename 05/25/12 0449 05/24/12 0433 05/23/12 0434  WBC 6.3 7.3 8.5  HGB 10.6* 10.3* 10.1*  HCT 30.2* 29.4* 29.8*  PLT 82* 66* 62*   BMET  Basename 05/25/12 0449 05/24/12 0433 05/23/12 0434  NA 133* 132* 134*  K 3.5 3.6 3.5  CL 106 108 108  CO2 18* 18* 17*  GLUCOSE 134* 114* 86  BUN 18 23 33*  CREATININE 1.07 1.08 1.69*  CALCIUM 7.9* 7.7* 7.5*   LFT  Basename 05/25/12 0449  PROT 6.0  ALBUMIN 2.0*  AST 116*  ALT 234*  ALKPHOS 186*  BILITOT 2.3*  BILIDIR --  IBILI --   PT/INR  Basename 05/24/12 1132  LABPROT 12.9  INR 0.95   Hepatitis Panel No results found for this basename: HEPBSAG,HCVAB,HEPAIGM,HEPBIGM in the last 72 hours C-Diff No results found for this basename: CDIFFTOX:3 in the last 72 hours Fecal Lactopherrin No results found for this basename: FECLLACTOFRN in the last 72 hours  Studies/Results: Mr Abdomen Wo Contrast  05/23/2012  *RADIOLOGY REPORT*  Clinical Data: Abnormal CT.  The patient gallstone pancreatitis.  MRI ABDOMEN WITHOUT CONTRAST  Technique:  Multiplanar multisequence MR imaging of the abdomen was performed. No intravenous contrast was administered.  Comparison: CT 05/22/2012, ERCP 05/22/2012  Findings: In small bilateral pleural effusions present.  No pericardial fluid.  No discrete lesions within the liver on the non contrast pulse sequences.  Liver does   however have a fine nodular contour best seen on the coronal T2-weighted imaging along the right hepatic margin.  There is no intrahepatic biliary ductal dilatation.  There are large gallstones within the gallbladder.  Stones  measure up to 1.7 cm.  There is gallbladder wall thickening up to 9 mm.  The common bile duct appears normal following ERCP and stone extraction.  No filling defects in the common bile duct are evident.  The pancreas and pancreatic duct are normal.  There is small amount of fluid inflammation along the pancreatic body and tail with small of fluid collecting along the anterior left pararenal fascia.  No organized fluid collections.  The spleen is normal.  The portal vein and splenic vein.  patent this noncontrast exam.  The adrenal glands and kidneys are normal.  IMPRESSION:  1.  Nodular liver suggest cirrhosis.  No focal lesions to suggest hepatoma or metastasis. 2.  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  3. Small of fluid along the tail the pancreas and mild peripancreatic edema consistent with mild pancreatitis.  No organized fluid collections. 4.  Common bile duct appears normal without evidence of filling defect following ERCP and stone extraction.  Original Report Authenticated By: Genevive Bi, M.D.    Medications:  Scheduled:   . amLODipine  5 mg Oral Daily  . ciprofloxacin  500 mg Oral BID  . ertapenem  1 g Intravenous Q24H  . hydrALAZINE  25  mg Oral Q8H  . insulin aspart  0-15 Units Subcutaneous TID WC  . insulin aspart  0-5 Units Subcutaneous QHS  . lisinopril  40 mg Oral Daily  . senna-docusate  1 tablet Oral BID  . sodium chloride  3 mL Intravenous Q12H   Continuous:   Assessment/Plan: 1) Gallstone pancreatitis.  2) Cirrhosis.  3) Bateremia/Ascending cholangitis  4) Enterococcal UTI.    Clinically he is stable.  The HBV and HCV serologies are still pending. Plan:  1) Continue with Cipro 500 mg BID.  2)  Cholecystectomy per surgery.   LOS: 4 days   Tyee Vandevoorde D 05/25/2012, 8:13 AM

## 2012-05-26 DIAGNOSIS — A419 Sepsis, unspecified organism: Secondary | ICD-10-CM | POA: Diagnosis present

## 2012-05-26 DIAGNOSIS — K8050 Calculus of bile duct without cholangitis or cholecystitis without obstruction: Secondary | ICD-10-CM

## 2012-05-26 DIAGNOSIS — E119 Type 2 diabetes mellitus without complications: Secondary | ICD-10-CM | POA: Diagnosis present

## 2012-05-26 DIAGNOSIS — K7460 Unspecified cirrhosis of liver: Secondary | ICD-10-CM

## 2012-05-26 DIAGNOSIS — Z01810 Encounter for preprocedural cardiovascular examination: Secondary | ICD-10-CM

## 2012-05-26 DIAGNOSIS — K851 Biliary acute pancreatitis without necrosis or infection: Secondary | ICD-10-CM

## 2012-05-26 DIAGNOSIS — R0989 Other specified symptoms and signs involving the circulatory and respiratory systems: Secondary | ICD-10-CM | POA: Diagnosis present

## 2012-05-26 DIAGNOSIS — I10 Essential (primary) hypertension: Secondary | ICD-10-CM

## 2012-05-26 HISTORY — DX: Unspecified cirrhosis of liver: K74.60

## 2012-05-26 HISTORY — DX: Biliary acute pancreatitis without necrosis or infection: K85.10

## 2012-05-26 LAB — GLUCOSE, CAPILLARY
Glucose-Capillary: 138 mg/dL — ABNORMAL HIGH (ref 70–99)
Glucose-Capillary: 128 mg/dL — ABNORMAL HIGH (ref 70–99)

## 2012-05-26 LAB — CBC
Platelets: 99 10*3/uL — ABNORMAL LOW (ref 150–400)
RBC: 4.03 MIL/uL — ABNORMAL LOW (ref 4.22–5.81)
WBC: 5.9 10*3/uL (ref 4.0–10.5)

## 2012-05-26 LAB — COMPREHENSIVE METABOLIC PANEL
ALT: 188 U/L — ABNORMAL HIGH (ref 0–53)
AST: 88 U/L — ABNORMAL HIGH (ref 0–37)
Albumin: 2.2 g/dL — ABNORMAL LOW (ref 3.5–5.2)
CO2: 18 mEq/L — ABNORMAL LOW (ref 19–32)
Chloride: 104 mEq/L (ref 96–112)
GFR calc non Af Amer: 68 mL/min — ABNORMAL LOW (ref >90)
Sodium: 131 mEq/L — ABNORMAL LOW (ref 135–145)
Total Bilirubin: 2.4 mg/dL — ABNORMAL HIGH (ref 0.3–1.2)

## 2012-05-26 MED ORDER — SODIUM CHLORIDE 0.9 % IV SOLN
INTRAVENOUS | Status: DC
  Administered 2012-05-26 – 2012-05-30 (×6): via INTRAVENOUS
  Administered 2012-05-31: 500 mL via INTRAVENOUS
  Administered 2012-05-31: 20:00:00 via INTRAVENOUS

## 2012-05-26 NOTE — Progress Notes (Addendum)
4 Days Post-Op  Subjective: He speaks no [place], but son says he's not had pain or discomfort since his stone was removed.  Had supper last pM about 8, and no problems.  Objective: Vital signs in last 24 hours: Temp:  [98.4 F (36.9 C)-99.1 F (37.3 C)] 98.4 F (36.9 C) (07/01 0515) Pulse Rate:  [73-81] 79  (07/01 0515) Resp:  [16-17] 17  (07/01 0515) BP: (153-170)/(79-91) 153/79 mmHg (07/01 0515) SpO2:  [96 %-98 %] 96 % (07/01 0515) Last BM Date: 05/24/12  PO 240 ml recorded, carb Modified diet, TM 99.3, VSS, BP improving, WBC is better, lipase still up yesterday.  Intake/Output from previous day: 06/30 0701 - 07/01 0700 In: 243 [P.O.:240; I.V.:3] Out: -  Intake/Output this shift:    General appearance: alert, cooperative and no distress GI: soft, non-tender; bowel sounds normal; no masses,  no organomegaly  Lab Results:   Basename 05/25/12 0449 05/24/12 0433  WBC 6.3 7.3  HGB 10.6* 10.3*  HCT 30.2* 29.4*  PLT 82* 66*    BMET  Basename 05/25/12 0449 05/24/12 0433  NA 133* 132*  K 3.5 3.6  CL 106 108  CO2 18* 18*  GLUCOSE 134* 114*  BUN 18 23  CREATININE 1.07 1.08  CALCIUM 7.9* 7.7*   PT/INR  Basename 05/24/12 1132  LABPROT 12.9  INR 0.95     Lab 05/25/12 0449 05/24/12 0433 05/23/12 0434 05/22/12 0010  AST 116* 191* 470* 224*  ALT 234* 324* 497* 277*  ALKPHOS 186* 146* 121* 191*  BILITOT 2.3* 3.0* 2.9* 4.3*  PROT 6.0 5.9* 6.0 7.7  ALBUMIN 2.0* 2.1* 2.4* 3.3*   Lipase       520                  191                 470                  >3000  Lipase     Component Value Date/Time   LIPASE 520* 05/25/2012 0449     Studies/Results: No results found.  Medications:    . amLODipine  5 mg Oral Daily  . ciprofloxacin  500 mg Oral BID  . hydrALAZINE  25 mg Oral Q8H  . insulin aspart  0-15 Units Subcutaneous TID WC  . insulin aspart  0-5 Units Subcutaneous QHS  . lisinopril  40 mg Oral Daily  . senna-docusate  1 tablet Oral BID  . sodium  chloride  3 mL Intravenous Q12H  . DISCONTD: ertapenem  1 g Intravenous Q24H    Assessment/Plan 1) Gallstone pancreatitis. S/p ERCP 05/22/12 Dr. Elnoria Howard 2) Cirrhosis.  3) Bateremia/Ascending cholangitis  4) Enterococcal UTI. 5) ? Chest pain,  Echo pending  Plan:  Await serologies, and Echo, NPO after MN check labs in AM and make a discuss surgery tomorrow.  I discussed with DR. Rosenbower, will check labs now, and make NPO see if possible to do later today. He would like lipase to be normal.  lipase is still up 571  Will resume clears for now.  Recheck in AM.  LOS: 5 days    Christian Wallace 05/26/2012

## 2012-05-26 NOTE — Progress Notes (Signed)
  Echocardiogram 2D Echocardiogram has been performed.  Christian Wallace 05/26/2012, 9:16 AM

## 2012-05-26 NOTE — Progress Notes (Signed)
Subjective: No acute events.  Objective: Vital signs in last 24 hours: Temp:  [98.4 F (36.9 C)-99.1 F (37.3 C)] 98.4 F (36.9 C) (07/01 0515) Pulse Rate:  [73-81] 79  (07/01 0515) Resp:  [16-17] 17  (07/01 0515) BP: (153-170)/(79-91) 153/79 mmHg (07/01 0515) SpO2:  [96 %-98 %] 96 % (07/01 0515) Last BM Date: 05/24/12  Intake/Output from previous day: 06/30 0701 - 07/01 0700 In: 243 [P.O.:240; I.V.:3] Out: -  Intake/Output this shift:    General appearance: alert and no distress GI: soft, non-tender; bowel sounds normal; no masses,  no organomegaly  Lab Results:  Basename 05/25/12 0449 05/24/12 0433  WBC 6.3 7.3  HGB 10.6* 10.3*  HCT 30.2* 29.4*  PLT 82* 66*   BMET  Basename 05/25/12 0449 05/24/12 0433  NA 133* 132*  K 3.5 3.6  CL 106 108  CO2 18* 18*  GLUCOSE 134* 114*  BUN 18 23  CREATININE 1.07 1.08  CALCIUM 7.9* 7.7*   LFT  Basename 05/25/12 0449  PROT 6.0  ALBUMIN 2.0*  AST 116*  ALT 234*  ALKPHOS 186*  BILITOT 2.3*  BILIDIR --  IBILI --   PT/INR  Basename 05/24/12 1132  LABPROT 12.9  INR 0.95   Hepatitis Panel No results found for this basename: HEPBSAG,HCVAB,HEPAIGM,HEPBIGM in the last 72 hours C-Diff No results found for this basename: CDIFFTOX:3 in the last 72 hours Fecal Lactopherrin No results found for this basename: FECLLACTOFRN in the last 72 hours  Studies/Results: No results found.  Medications:  Scheduled:   . amLODipine  5 mg Oral Daily  . ciprofloxacin  500 mg Oral BID  . hydrALAZINE  25 mg Oral Q8H  . insulin aspart  0-15 Units Subcutaneous TID WC  . insulin aspart  0-5 Units Subcutaneous QHS  . lisinopril  40 mg Oral Daily  . senna-docusate  1 tablet Oral BID  . sodium chloride  3 mL Intravenous Q12H   Continuous:   Assessment/Plan: 1) Gallstone pancreatitis.  2) Cirrhosis.  3) Bateremia/Ascending cholangitis  4) Enterococcal UTI.    No new changes.  Still awaiting HBV and HCV serologies.  Plan:  1)  Continue with current care.  LOS: 5 days   Sarra Rachels D 05/26/2012, 8:28 AM

## 2012-05-26 NOTE — Progress Notes (Signed)
Patient seen and examined.  Agree with PA's note.  

## 2012-05-26 NOTE — Progress Notes (Signed)
Lipase higher today and WBC up some consistent with persistent pancreatitis.  Needs bowel rest.

## 2012-05-26 NOTE — Progress Notes (Signed)
Subjective:  No chest pain, NPO currently.  Objective:  Vital Signs in the last 24 hours: Temp:  [98.4 F (36.9 C)-99.1 F (37.3 C)] 98.4 F (36.9 C) (07/01 0515) Pulse Rate:  [73-81] 79  (07/01 0515) Resp:  [16-17] 17  (07/01 0515) BP: (153-170)/(79-91) 157/87 mmHg (07/01 1350) SpO2:  [96 %-98 %] 96 % (07/01 0515)  Intake/Output from previous day:  Intake/Output Summary (Last 24 hours) at 05/26/12 1401 Last data filed at 05/26/12 1100  Gross per 24 hour  Intake    240 ml  Output      0 ml  Net    240 ml    Physical Exam: General appearance: alert, cooperative and no distress Lungs: clear to auscultation bilaterally Heart: regular rate and rhythm   Rate: 78  Rhythm: normal sinus rhythm  Lab Results:  Basename 05/26/12 0845 05/25/12 0449  WBC 5.9 6.3  HGB 12.1* 10.6*  PLT 99* 82*    Basename 05/26/12 0845 05/25/12 0449  NA 131* 133*  K 3.4* 3.5  CL 104 106  CO2 18* 18*  GLUCOSE 136* 134*  BUN 18 18  CREATININE 1.06 1.07   No results found for this basename: TROPONINI:2,CK,MB:2 in the last 72 hours Hepatic Function Panel  Basename 05/26/12 0845  PROT 6.6  ALBUMIN 2.2*  AST 88*  ALT 188*  ALKPHOS 221*  BILITOT 2.4*  BILIDIR --  IBILI --   No results found for this basename: CHOL in the last 72 hours  Basename 05/24/12 1132  INR 0.95    Imaging: Imaging results have been reviewed  Cardiac Studies:  Assessment/Plan:   Principal Problem:  *Encounter for pre-operative cardiovascular clearance Active Problems:  Pancreatitis  AKI (acute kidney injury), SCr improving  Septicemia, organism pending  Hyponatremia  HTN (hypertension)  Thrombocytopenia  Diabetes mellitus  Bruit, RCA  Plan- Echo done, results pending, Dr Rennis Golden to see.    Corine Shelter PA-C 05/26/2012, 2:01 PM

## 2012-05-26 NOTE — Progress Notes (Signed)
[facility] PROGRESS NOTE  Christian Wallace GNF:[id number] DOB: Jun 04, 1941 DOA: 05/21/2012 PCP: No primary provider on file.  Assessment/Plan: 1. CBS stone- s/p ERCP, advance diet per GI, feeling better, no nausea  2. Gram negative rods in blood cultures most likely septic from CBD stone (klebsiella)- cipro PO- D/C invanz   3. Nausea & vomiting- resolved   4. Diarrhea- resolved   5. Hyponatremia- IVF d/c'd  6. Pancreatitis- lipase > 3000 on admission, lipase decreased, abdominal pain better   7. AKI (acute kidney injury) - IVF, improved- old labs point to CKD but Cr stable WNL now  8. DM- SSI, hold metformin and glipizide  9. Fever- abx , seems to be resolving  10. Chills- abx , resolved  11. HTN (hypertension)- restart lisinopril- increase dose as BP still high and add norvasc and PRN medication  12. Thrombocytopenia- ? related to infection, monitor- d/c heparin and give SCDs  13. Falls- consult PT for eval- recommend home health  14.  ?liver spots on CT scan- order alpha feta protein WNL,  MRI of the abdomen without contrast- suggest cirrhosis   per Dr. Elnoria Howard:  suggestion of cirrhosis on the noncontrast scan with the possibility of hepatomas. Since he is [place] HBV is a high consideration. Additionally, a noncontrast MRI scan was recommended for further work up. Radiology mentioned that his gallbladder appeared to be "sick", but clinically he is well- surgery consulted, cardiac clearance needing echo. Hope for surgery tomorrow    Code Status: full Family Communication: family at bedside and on phone Disposition Plan: home possibly with home health at D/C - lives with wife and 2 kids   [name], D.O.  [facility] Pager [phone]  05/26/2012, 9:01 AM  LOS: 5 days    Consultants:  Elnoria Howard (GI)  Procedures:  ERCP   Subjective: Still feeling well.  Family translated at bedside Eating ok    Objective: Filed Vitals:   05/25/12 0452  05/25/12 1413 05/25/12 2140 05/26/12 0515  BP: 197/80 167/91 170/84 153/79  Pulse: 64 73 81 79  Temp: 99.3 F (37.4 C) 98.7 F (37.1 C) 99.1 F (37.3 C) 98.4 F (36.9 C)  TempSrc: Oral Oral Oral Oral  Resp: 17 16 16 17   Height:      Weight:      SpO2: 97% 97% 98% 96%    Intake/Output Summary (Last 24 hours) at 05/26/12 0901 Last data filed at 05/26/12 0510  Gross per 24 hour  Intake    240 ml  Output      0 ml  Net    240 ml    Exam:  General: Awake, Oriented x3, No acute distress.  HEENT: EOMI, moist mucous membranes  Neck: Supple  CV: S1 and S2, rrr Lungs: Clear to ascultation bilaterally, no wheezing  Abdomen: Soft, decreased tenderness RUQ and epigastric,  Not distended, +bowel sounds.  Ext: Good pulses. Trace edema. No clubbing or cyanosis noted.  Neuro: Cranial Nerves II-XII grossly intact. Has 5/5 motor strength in upper and lower extremities.   Data Reviewed: Basic Metabolic Panel:  Lab 05/25/12 5284 05/24/12 0433 05/23/12 0434 05/22/12 0010  NA 133* 132* 134* 133*  K 3.5 3.6 -- --  CL 106 108 108 97  CO2 18* 18* 17* 16*  GLUCOSE 134* 114* 86 265*  BUN 18 23 33* 41*  CREATININE 1.07 1.08 1.69* 2.97*  CALCIUM 7.9* 7.7* 7.5* 9.0  MG -- -- -- --  PHOS -- -- -- --  Liver Function Tests:  Lab 05/25/12 0449 05/24/12 0433 05/23/12 0434 05/22/12 0010  AST 116* 191* 470* 224*  ALT 234* 324* 497* 277*  ALKPHOS 186* 146* 121* 191*  BILITOT 2.3* 3.0* 2.9* 4.3*  PROT 6.0 5.9* 6.0 7.7  ALBUMIN 2.0* 2.1* 2.4* 3.3*    Lab 05/25/12 0449 05/24/12 0433 05/23/12 0434 05/22/12 0010  LIPASE 520* 441* 475* >3000*  AMYLASE -- -- -- --   No results found for this basename: AMMONIA:5 in the last 168 hours CBC:  Lab 05/25/12 0449 05/24/12 0433 05/23/12 0434 05/22/12 0010  WBC 6.3 7.3 8.5 4.8  NEUTROABS -- -- -- 3.7  HGB 10.6* 10.3* 10.1* 12.6*  HCT 30.2* 29.4* 29.8* 36.1*  MCV 85.6 86.5 88.2 87.2  PLT 82* 66* 62* 118*   Cardiac Enzymes: No results found for  this basename: CKTOTAL:5,CKMB:5,CKMBINDEX:5,TROPONINI:5 in the last 168 hours BNP: No components found with this basename: POCBNP:5 CBG:  Lab 05/26/12 0725 05/25/12 2151 05/25/12 1743 05/25/12 1203 05/25/12 0812  GLUCAP 138* 128* 201* 162* 110*    Recent Results (from the past 240 hour(s))  CULTURE, BLOOD (ROUTINE X 2)     Status: Normal   Collection Time   05/22/12 12:10 AM      Component Value Range Status Comment   Specimen Description BLOOD RIGHT ANTECUBITAL   Final    Special Requests BOTTLES DRAWN AEROBIC AND ANAEROBIC   Final    Culture  Setup Time 05/22/2012 09:10   Final    Culture     Final    Value: KLEBSIELLA PNEUMONIAE     Note: SUSCEPTIBILITIES PERFORMED ON PREVIOUS CULTURE WITHIN THE LAST 5 DAYS.     Note: Gram Stain Report Called to,Read Back By and Verified With: TRIPLIN S @ 2120 ON 05/22/12 BY GOLLD   Report Status 05/25/2012 FINAL   Final   CULTURE, BLOOD (ROUTINE X 2)     Status: Normal   Collection Time   05/22/12 12:15 AM      Component Value Range Status Comment   Specimen Description BLOOD LEFT ANTECUBITAL   Final    Special Requests BOTTLES DRAWN AEROBIC AND ANAEROBIC   Final    Culture  Setup Time 05/22/2012 09:10   Final    Culture     Final    Value: KLEBSIELLA PNEUMONIAE     Note: Gram Stain Report Called to,Read Back By and Verified With: TRIPLIN S @ 2120 ON 05/22/12 BY GOLLD   Report Status 05/25/2012 FINAL   Final    Organism ID, Bacteria KLEBSIELLA PNEUMONIAE   Final   URINE CULTURE     Status: Normal   Collection Time   05/22/12  1:31 AM      Component Value Range Status Comment   Specimen Description URINE, CLEAN CATCH   Final    Special Requests NONE   Final    Culture  Setup Time 05/22/2012 09:22   Final    Colony Count 15,000 COLONIES/ML   Final    Culture ENTEROCOCCUS SPECIES   Final    Report Status 05/25/2012 FINAL   Final    Organism ID, Bacteria ENTEROCOCCUS SPECIES   Final      Studies: Ct Abdomen Pelvis Wo  Contrast  05/22/2012  **ADDENDUM** CREATED: 05/22/2012 16:38:54  The liver has a nodular contour with ill-defined low density potential lesions.  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  Recommend MRI of the abdomen without contrast (patient's creatinine elevated).  If the  patient  is to sick  to undergo MRI which requires breath-holding, recommend ultrasound of the liver.  Also consider serum alpha-fetoprotein analysis.  Initial dictation by Dr. Andria Meuse and addendum by Dr. Amil Amen  Findings personally  conveyed to  Dr. Elnoria Howard  05/22/2012 and 1630 hours.  **END ADDENDUM** SIGNED BY: Genevive Bi, M.D.   05/22/2012  *RADIOLOGY REPORT*  Clinical Data: Nausea, vomiting, diarrhea, body aches and chills. Elevated lipase.  CT ABDOMEN AND PELVIS WITHOUT CONTRAST  Technique:  Multidetector CT imaging of the abdomen and pelvis was performed following the standard protocol without intravenous contrast.  Comparison: None.  Findings: Atelectasis or infiltration in the lung bases.  Cardiac enlargement.  Coronary artery calcification.  The unenhanced appearance of the liver, spleen, adrenal glands, kidneys, and retroperitoneal lymph nodes is unremarkable.  Stones in the gallbladder without obvious gallbladder wall thickening or inflammatory change.  No significant bile duct dilatation. Prominence of the ampulla which could represent mass or inflammation.  There is a high density in the ampulla consistent with a stone in the distal common bile duct.  No significant pancreatic ductal dilatation.  Mild infiltration in the peripancreatic fat vertically around the tail which might represent changes of pancreatitis.  No free fluid or free air in the abdomen. Calcification of the abdominal aorta without aneurysm.  The stomach, small bowel, and colon are not abnormally distended.  Pelvis:  The prostate gland is mildly enlarged, measuring 4.6 x 3.4 cm.  Bladder wall is not thickened.  No free or loculated pelvic fluid  collections.  No diverticulitis.  The appendix is normal.  No significant lymphadenopathy in the pelvis.  Degenerative changes in the lumbar spine.  IMPRESSION: Cholelithiasis with apparent stone in the distal common bile duct. No significant bile duct dilatation.  There is prominent soft tissue in the anterior region which might be due to inflammation or mass.  Mild infiltration around the pancreas suggesting mild pancreatitis.  Original Report Authenticated By: Genevive Bi, M.D.   Dg Chest 2 View  05/22/2012  *RADIOLOGY REPORT*  Clinical Data: Fever and nausea.  CHEST - 2 VIEW  Comparison: None.  Findings: Shallow inspiration.  Linear atelectasis or infiltration in the lung bases.  Normal heart size and pulmonary vascularity. No blunting of costophrenic angles.  No pneumothorax. Calcification of the aorta.  The mild degenerative changes in the spine.  IMPRESSION: Shallow inspiration.  Linear atelectasis or infiltration in the lung bases.  Original Report Authenticated By: Marlon Pel, M.D.   Dg Ercp With Sphincterotomy  05/22/2012  *RADIOLOGY REPORT*  Clinical Data: Common bile duct stone  ERCP with sphincterotomy  Comparison:  CT 05/22/2012  Technique:  Multiple spot images obtained with the fluoroscopic device and submitted for interpretation post-procedure.  ERCP was performed by Dr. Melvyn Neth.  Findings: Three images are provided.  Initial image demonstrates an endoscope within the duodenum with cannulation of the common bile duct following sphincterotomy.  The balloon sweep was performed with stone extraction.  Final image demonstrates emptying of contrast in the common bile duct.  IMPRESSION: ERCP with sphincterotomy, balloon sweep, and stone extraction.  Upon review of the CT exam, the liver has a nodular contour with ill-defined low density potential lesions.  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  Recommend MRI of the abdomen without contrast (patient's creatinine elevated).  If the   patient is to sick  to undergo MRI which requires breath-holding, recommend ultrasound of the liver.  Findings conveyed to Dr. Elnoria Howard on 05/22/2012 at 1630 hours.  These images were submitted for radiologic interpretation only. Please see the procedural report for the amount of contrast and the fluoroscopy time utilized.  Original Report Authenticated By: Genevive Bi, M.D.    Scheduled Meds:    . amLODipine  5 mg Oral Daily  . ciprofloxacin  500 mg Oral BID  . hydrALAZINE  25 mg Oral Q8H  . insulin aspart  0-15 Units Subcutaneous TID WC  . insulin aspart  0-5 Units Subcutaneous QHS  . lisinopril  40 mg Oral Daily  . senna-docusate  1 tablet Oral BID  . sodium chloride  3 mL Intravenous Q12H   Continuous Infusions:

## 2012-05-26 NOTE — Progress Notes (Signed)
Pt. Seen and examined. Agree with the NP/PA-C note as written.  Echo is reviewed. There is severe concentric hypertrophy, but no wall motion abnormalities. EF 55-60%. 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 I agree with Dr. Hazle Coca assessment that he is low risk for surgery. Will sign-off. Call with questions.  Chrystie Nose, MD, [facility] Attending Cardiologist [facility]

## 2012-05-27 DIAGNOSIS — N179 Acute kidney failure, unspecified: Secondary | ICD-10-CM

## 2012-05-27 LAB — CBC
HCT: 30.6 % — ABNORMAL LOW (ref 39.0–52.0)
MCH: 30.1 pg (ref 26.0–34.0)
MCV: 86.2 fL (ref 78.0–100.0)
Platelets: 111 10*3/uL — ABNORMAL LOW (ref 150–400)
RBC: 3.55 MIL/uL — ABNORMAL LOW (ref 4.22–5.81)
RDW: 13.8 % (ref 11.5–15.5)

## 2012-05-27 LAB — COMPREHENSIVE METABOLIC PANEL
AST: 77 U/L — ABNORMAL HIGH (ref 0–37)
BUN: 15 mg/dL (ref 6–23)
CO2: 18 mEq/L — ABNORMAL LOW (ref 19–32)
Calcium: 8.2 mg/dL — ABNORMAL LOW (ref 8.4–10.5)
Creatinine, Ser: 1 mg/dL (ref 0.50–1.35)
GFR calc non Af Amer: 73 mL/min — ABNORMAL LOW (ref >90)
Total Bilirubin: 1.9 mg/dL — ABNORMAL HIGH (ref 0.3–1.2)

## 2012-05-27 LAB — GLUCOSE, CAPILLARY
Glucose-Capillary: 117 mg/dL — ABNORMAL HIGH (ref 70–99)
Glucose-Capillary: 90 mg/dL (ref 70–99)
Glucose-Capillary: 95 mg/dL (ref 70–99)

## 2012-05-27 LAB — HEPATITIS C ANTIBODY: HCV Ab: REACTIVE — AB

## 2012-05-27 LAB — LIPASE, BLOOD: Lipase: 293 U/L — ABNORMAL HIGH (ref 11–59)

## 2012-05-27 NOTE — Progress Notes (Signed)
5 Days Post-Op  Subjective: Family in room with him.  He denies pain.  Doesn't feel bad.   Objective: Vital signs in last 24 hours: Temp:  [97.9 F (36.6 C)-99 F (37.2 C)] 99 F (37.2 C) (07/02 0623) Pulse Rate:  [70-79] 79  (07/02 0623) Resp:  [16-17] 16  (07/02 0623) BP: (145-173)/(70-87) 145/70 mmHg (07/02 0623) SpO2:  [98 %] 98 % (07/02 0623) Last BM Date: 05/25/12  Intake/Output from previous day: 07/01 0701 - 07/02 0700 In: 1000 [P.O.:600; I.V.:400] Out: 450 [Urine:450] Intake/Output this shift:    General appearance: alert, cooperative and no distress GI: soft, non-tender; bowel sounds normal; no masses,  no organomegaly  Lab Results:   Basename 05/27/12 0355 05/26/12 0845  WBC 5.6 5.9  HGB 10.7* 12.1*  HCT 30.6* 34.5*  PLT 111* 99*    BMET  Basename 05/27/12 0355 05/26/12 0845  NA 134* 131*  K 3.7 3.4*  CL 108 104  CO2 18* 18*  GLUCOSE 109* 136*  BUN 15 18  CREATININE 1.00 1.06  CALCIUM 8.2* 8.4   PT/INR  Basename 05/24/12 1132  LABPROT 12.9  INR 0.95     Lab 05/27/12 0355 05/26/12 0845 05/25/12 0449 05/24/12 0433 05/23/12 0434  AST 77* 88* 116* 191* 470*  ALT 146* 188* 234* 324* 497*  ALKPHOS 209* 221* 186* 146* 121*  BILITOT 1.9* 2.4* 2.3* 3.0* 2.9*  PROT 6.0 6.6 6.0 5.9* 6.0  ALBUMIN 2.1* 2.2* 2.0* 2.1* 2.4*  LIPASE       293                 520                   470                191                 >3000    Lipase     Component Value Date/Time   LIPASE 293* 05/27/2012 0355     Studies/Results: No results found.  Medications:    . amLODipine  5 mg Oral Daily  . ciprofloxacin  500 mg Oral BID  . hydrALAZINE  25 mg Oral Q8H  . insulin aspart  0-15 Units Subcutaneous TID WC  . insulin aspart  0-5 Units Subcutaneous QHS  . lisinopril  40 mg Oral Daily  . senna-docusate  1 tablet Oral BID  . sodium chloride  3 mL Intravenous Q12H    Assessment/Plan ) Gallstone pancreatitis. S/p ERCP 05/22/12 Dr. Elnoria Howard  2) Cirrhosis.  3)  Bateremia/Ascending cholangitis/Klebsiella ONE Culture. 4) Enterococcal UTI.  5) ? Chest pain, Echo pending   Plan:  Keep on clears for now.  When lipase is back to normal we will plan to do cholecystectomy.  LOS: 6 days    Adelin Ventrella 05/27/2012

## 2012-05-27 NOTE — Progress Notes (Signed)
Physical Therapy Treatment Patient Details Name: Christian Wallace MRN: [id number] DOB: 1941/06/08 Today's Date: 05/27/2012 Time: 4098-1191 PT Time Calculation (min): 10 min  PT Assessment / Plan / Recommendation Comments on Treatment Session  Performed well this session. Able to advance to ambulation without RW. Family states pt may have surgery in 1-2 days [place] may have to re-evaluate pt's mobility.    Follow Up Recommendations  Home health PT    Barriers to Discharge        Equipment Recommendations  Rolling walker with 5" wheels    Recommendations for Other Services    Frequency Min 3X/week   Plan Discharge plan remains appropriate    Precautions / Restrictions Precautions Precautions: Fall Restrictions Weight Bearing Restrictions: No   Pertinent Vitals/Pain     Mobility  Bed Mobility Bed Mobility: Supine to Sit;Sit to Supine Supine to Sit: 6: Modified independent (Device/Increase time) Sitting - Scoot to Edge of Bed: 6: Modified independent (Device/Increase time) Transfers Transfers: Stand to Sit;Sit to Stand Sit to Stand: 5: Supervision Stand to Sit: 5: Supervision Details for Transfer Assistance: Multimodal cues for safety.  Ambulation/Gait Ambulation/Gait Assistance: 4: Min guard Ambulation Distance (Feet): 170 Feet Assistive device: Rolling walker;None Ambulation/Gait Assistance Details: 59' with RW, 40' without AD. Visual cues for safety. Slightly unsteady during amb without RW, but no LOB.  Gait Pattern: Step-through pattern    Exercises General Exercises - Lower Extremity Long Arc Quad: AROM;10 reps;Both;Strengthening;Seated Heel Slides: AROM;Strengthening;Both;10 reps;Supine Hip ABduction/ADduction: AROM;Strengthening;Both;10 reps;Supine   PT Diagnosis:    PT Problem List:   PT Treatment Interventions:     PT Goals Acute Rehab PT Goals PT Goal: Supine/Side to Sit - Progress: Met PT Goal: Sit to Stand - Progress: Progressing toward goal PT Goal: Ambulate -  Progress: Progressing toward goal  Visit Information  Last PT Received On: 05/27/12 Assistance Needed: +1    Subjective Data  Subjective: Family present and translated during session.  Patient Stated Goal: None   Cognition  Overall Cognitive Status: Difficult to assess Difficult to assess due to: Non-English speaking Arousal/Alertness: Awake/alert Behavior During Session: [facility] for tasks performed    Balance     End of Session PT - End of Session Equipment Utilized During Treatment: Gait belt Activity Tolerance: Patient tolerated treatment well Patient left: in bed;with call bell/phone within reach   GP     Rebeca Alert [facility] 05/27/2012, 11:05 AM [phone]

## 2012-05-27 NOTE — Progress Notes (Signed)
[facility] PROGRESS NOTE  Christian Wallace WGN:[id number] DOB: 09/07/1941 DOA: 05/21/2012 PCP: No primary provider on file.  Assessment/Plan: 1. CBS stone- s/p ERCP, advance diet per GI, feeling better, no nausea (chronic cholecystitis- plan for gallbladder removal when Lipase and LFTs better)  2. Gram negative rods in blood cultures most likely septic from CBD stone (klebsiella)- cipro PO- D/C invanz   3. Nausea & vomiting- resolved   4. Diarrhea- resolved   5. Hyponatremia- IVF d/c'd  6. Pancreatitis- lipase > 3000 on admission, lipase decreased, abdominal pain better   7. AKI (acute kidney injury) - IVF, improved- old labs point to CKD but Cr stable WNL now  8. DM- SSI, hold metformin and glipizide  9. Fever- abx , seems to be resolving  10. Chills- abx , resolved  11. HTN (hypertension)- restart lisinopril- increase dose as BP still high and add norvasc and PRN medication  12. Thrombocytopenia- ? related to infection, monitor- d/c heparin and give SCDs  13. Falls- consult PT for eval- recommend home health  14.  ?liver spots on CT scan- order alpha feta protein WNL,  MRI of the abdomen without contrast- suggest cirrhosis   per Dr. Elnoria Howard:  suggestion of cirrhosis on the noncontrast scan with the possibility of hepatomas. Since he is [place] HBV is a high consideration.  Radiology mentioned that his gallbladder appeared to be "sick", but clinically he is well- surgery consulted- Hope for surgery tomorrow    Code Status: full Family Communication: family at bedside Disposition Plan: home possibly with home health at D/C - lives with wife and 2 kids   Christian Wallace, D.O.  [facility] Pager [phone]  05/27/2012, 8:16 AM  LOS: 6 days    Consultants:  Elnoria Howard (GI)  [facility]  Procedures:  ERCP  Interim History Patient was admitted with bone shaking chills found to be septic from a CBD stone.  Improved s/p ERCP.   Grew Klebsiella in blood. Awaiting  normal lipase/LFTS before cholecystectomy- then D/C home   Subjective: Still feeling well.  Family translated at bedside Eating ok    Objective: Filed Vitals:   05/26/12 1426 05/26/12 2151 05/27/12 0007 05/27/12 0623  BP: 153/78 173/84 154/80 145/70  Pulse: 70 75  79  Temp: 97.9 F (36.6 C) 98.8 F (37.1 C)  99 F (37.2 C)  TempSrc: Oral Oral  Oral  Resp: 17 16  16   Height:      Weight:      SpO2: 98% 98%  98%    Intake/Output Summary (Last 24 hours) at 05/27/12 0816 Last data filed at 05/27/12 9528  Gross per 24 hour  Intake   1000 ml  Output    450 ml  Net    550 ml    Exam:  General: Awake, Oriented x3, No acute distress.  HEENT: EOMI, moist mucous membranes  Neck: Supple  CV: S1 and S2, rrr Lungs: Clear to ascultation bilaterally, no wheezing  Abdomen: Soft, no tenderness RUQ or epigastric,  Not distended, +bowel sounds.  Ext: Good pulses. Trace edema. No clubbing or cyanosis noted.  Neuro: Cranial Nerves II-XII grossly intact. Has 5/5 motor strength in upper and lower extremities.   Data Reviewed: Basic Metabolic Panel:  Lab 05/27/12 4132 05/26/12 0845 05/25/12 0449 05/24/12 0433 05/23/12 0434  NA 134* 131* 133* 132* 134*  K 3.7 3.4* -- -- --  CL 108 104 106 108 108  CO2 18* 18* 18* 18* 17*  GLUCOSE 109* 136* 134* 114*  86  BUN 15 18 18 23  33*  CREATININE 1.00 1.06 1.07 1.08 1.69*  CALCIUM 8.2* 8.4 7.9* 7.7* 7.5*  MG -- -- -- -- --  PHOS -- -- -- -- --   Liver Function Tests:  Lab 05/27/12 0355 05/26/12 0845 05/25/12 0449 05/24/12 0433 05/23/12 0434  AST 77* 88* 116* 191* 470*  ALT 146* 188* 234* 324* 497*  ALKPHOS 209* 221* 186* 146* 121*  BILITOT 1.9* 2.4* 2.3* 3.0* 2.9*  PROT 6.0 6.6 6.0 5.9* 6.0  ALBUMIN 2.1* 2.2* 2.0* 2.1* 2.4*    Lab 05/27/12 0355 05/26/12 0845 05/25/12 0449 05/24/12 0433 05/23/12 0434  LIPASE 293* 571* 520* 441* 475*  AMYLASE -- -- -- -- --   No results found for this basename: AMMONIA:5 in the last 168  hours CBC:  Lab 05/27/12 0355 05/26/12 0845 05/25/12 0449 05/24/12 0433 05/23/12 0434 05/22/12 0010  WBC 5.6 5.9 6.3 7.3 8.5 --  NEUTROABS -- -- -- -- -- 3.7  HGB 10.7* 12.1* 10.6* 10.3* 10.1* --  HCT 30.6* 34.5* 30.2* 29.4* 29.8* --  MCV 86.2 85.6 85.6 86.5 88.2 --  PLT 111* 99* 82* 66* 62* --   Cardiac Enzymes: No results found for this basename: CKTOTAL:5,CKMB:5,CKMBINDEX:5,TROPONINI:5 in the last 168 hours BNP: No components found with this basename: POCBNP:5 CBG:  Lab 05/27/12 0719 05/26/12 2147 05/26/12 0725 05/25/12 2151 05/25/12 1743  GLUCAP 117* 128* 138* 128* 201*    Recent Results (from the past 240 hour(s))  CULTURE, BLOOD (ROUTINE X 2)     Status: Normal   Collection Time   05/22/12 12:10 AM      Component Value Range Status Comment   Specimen Description BLOOD RIGHT ANTECUBITAL   Final    Special Requests BOTTLES DRAWN AEROBIC AND ANAEROBIC   Final    Culture  Setup Time 05/22/2012 09:10   Final    Culture     Final    Value: KLEBSIELLA PNEUMONIAE     Note: SUSCEPTIBILITIES PERFORMED ON PREVIOUS CULTURE WITHIN THE LAST 5 DAYS.     Note: Gram Stain Report Called to,Read Back By and Verified With: TRIPLIN S @ 2120 ON 05/22/12 BY GOLLD   Report Status 05/25/2012 FINAL   Final   CULTURE, BLOOD (ROUTINE X 2)     Status: Normal   Collection Time   05/22/12 12:15 AM      Component Value Range Status Comment   Specimen Description BLOOD LEFT ANTECUBITAL   Final    Special Requests BOTTLES DRAWN AEROBIC AND ANAEROBIC   Final    Culture  Setup Time 05/22/2012 09:10   Final    Culture     Final    Value: KLEBSIELLA PNEUMONIAE     Note: Gram Stain Report Called to,Read Back By and Verified With: TRIPLIN S @ 2120 ON 05/22/12 BY GOLLD   Report Status 05/25/2012 FINAL   Final    Organism ID, Bacteria KLEBSIELLA PNEUMONIAE   Final   URINE CULTURE     Status: Normal   Collection Time   05/22/12  1:31 AM      Component Value Range Status Comment   Specimen Description  URINE, CLEAN CATCH   Final    Special Requests NONE   Final    Culture  Setup Time 05/22/2012 09:22   Final    Colony Count 15,000 COLONIES/ML   Final    Culture ENTEROCOCCUS SPECIES   Final    Report Status 05/25/2012 FINAL  Final    Organism ID, Bacteria ENTEROCOCCUS SPECIES   Final      Studies: Ct Abdomen Pelvis Wo Contrast  05/22/2012  **ADDENDUM** CREATED: 05/22/2012 16:38:54  The liver has a nodular contour with ill-defined low density potential lesions.  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  Recommend MRI of the abdomen without contrast (patient's creatinine elevated).  If the  patient is to sick  to undergo MRI which requires breath-holding, recommend ultrasound of the liver.  Also consider serum alpha-fetoprotein analysis.  Initial dictation by Dr. Andria Meuse and addendum by Dr. Amil Amen  Findings personally  conveyed to  Dr. Elnoria Howard  05/22/2012 and 1630 hours.  **END ADDENDUM** SIGNED BY: Genevive Bi, M.D.   05/22/2012  *RADIOLOGY REPORT*  Clinical Data: Nausea, vomiting, diarrhea, body aches and chills. Elevated lipase.  CT ABDOMEN AND PELVIS WITHOUT CONTRAST  Technique:  Multidetector CT imaging of the abdomen and pelvis was performed following the standard protocol without intravenous contrast.  Comparison: None.  Findings: Atelectasis or infiltration in the lung bases.  Cardiac enlargement.  Coronary artery calcification.  The unenhanced appearance of the liver, spleen, adrenal glands, kidneys, and retroperitoneal lymph nodes is unremarkable.  Stones in the gallbladder without obvious gallbladder wall thickening or inflammatory change.  No significant bile duct dilatation. Prominence of the ampulla which could represent mass or inflammation.  There is a high density in the ampulla consistent with a stone in the distal common bile duct.  No significant pancreatic ductal dilatation.  Mild infiltration in the peripancreatic fat vertically around the tail which might represent changes  of pancreatitis.  No free fluid or free air in the abdomen. Calcification of the abdominal aorta without aneurysm.  The stomach, small bowel, and colon are not abnormally distended.  Pelvis:  The prostate gland is mildly enlarged, measuring 4.6 x 3.4 cm.  Bladder wall is not thickened.  No free or loculated pelvic fluid collections.  No diverticulitis.  The appendix is normal.  No significant lymphadenopathy in the pelvis.  Degenerative changes in the lumbar spine.  IMPRESSION: Cholelithiasis with apparent stone in the distal common bile duct. No significant bile duct dilatation.  There is prominent soft tissue in the anterior region which might be due to inflammation or mass.  Mild infiltration around the pancreas suggesting mild pancreatitis.  Original Report Authenticated By: Genevive Bi, M.D.   Dg Chest 2 View  05/22/2012  *RADIOLOGY REPORT*  Clinical Data: Fever and nausea.  CHEST - 2 VIEW  Comparison: None.  Findings: Shallow inspiration.  Linear atelectasis or infiltration in the lung bases.  Normal heart size and pulmonary vascularity. No blunting of costophrenic angles.  No pneumothorax. Calcification of the aorta.  The mild degenerative changes in the spine.  IMPRESSION: Shallow inspiration.  Linear atelectasis or infiltration in the lung bases.  Original Report Authenticated By: Marlon Pel, M.D.   Dg Ercp With Sphincterotomy  05/22/2012  *RADIOLOGY REPORT*  Clinical Data: Common bile duct stone  ERCP with sphincterotomy  Comparison:  CT 05/22/2012  Technique:  Multiple spot images obtained with the fluoroscopic device and submitted for interpretation post-procedure.  ERCP was performed by Dr. Melvyn Neth.  Findings: Three images are provided.  Initial image demonstrates an endoscope within the duodenum with cannulation of the common bile duct following sphincterotomy.  The balloon sweep was performed with stone extraction.  Final image demonstrates emptying of contrast in the common bile duct.   IMPRESSION: ERCP with sphincterotomy, balloon sweep, and stone extraction.  Upon review  of the CT exam, the liver has a nodular contour with ill-defined low density potential lesions.  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  Recommend MRI of the abdomen without contrast (patient's creatinine elevated).  If the  patient is to sick  to undergo MRI which requires breath-holding, recommend ultrasound of the liver.  Findings conveyed to Dr. Elnoria Howard on 05/22/2012 at 1630 hours.  These images were submitted for radiologic interpretation only. Please see the procedural report for the amount of contrast and the fluoroscopy time utilized.  Original Report Authenticated By: Genevive Bi, M.D.    Scheduled Meds:    . amLODipine  5 mg Oral Daily  . ciprofloxacin  500 mg Oral BID  . hydrALAZINE  25 mg Oral Q8H  . insulin aspart  0-15 Units Subcutaneous TID WC  . insulin aspart  0-5 Units Subcutaneous QHS  . lisinopril  40 mg Oral Daily  . senna-docusate  1 tablet Oral BID  . sodium chloride  3 mL Intravenous Q12H   Continuous Infusions:    . sodium chloride 50 mL/hr at 05/27/12 0345

## 2012-05-27 NOTE — Progress Notes (Signed)
Subjective: No acute events.  Objective: Vital signs in last 24 hours: Temp:  [97.9 F (36.6 C)-99 F (37.2 C)] 97.9 F (36.6 C) (07/02 1336) Pulse Rate:  [75-79] 77  (07/02 1336) Resp:  [16] 16  (07/02 1336) BP: (145-173)/(70-84) 154/80 mmHg (07/02 1335) SpO2:  [97 %-98 %] 97 % (07/02 1336) Last BM Date: 05/22/12  Intake/Output from previous day: 07/01 0701 - 07/02 0700 In: 1000 [P.O.:600; I.V.:400] Out: 450 [Urine:450] Intake/Output this shift: Total I/O In: 350 [I.V.:350] Out: 400 [Urine:400]  General appearance: alert and no distress GI: soft, non-tender; bowel sounds normal; no masses,  no organomegaly  Lab Results:  Basename 05/27/12 0355 05/26/12 0845 05/25/12 0449  WBC 5.6 5.9 6.3  HGB 10.7* 12.1* 10.6*  HCT 30.6* 34.5* 30.2*  PLT 111* 99* 82*   BMET  Basename 05/27/12 0355 05/26/12 0845 05/25/12 0449  NA 134* 131* 133*  K 3.7 3.4* 3.5  CL 108 104 106  CO2 18* 18* 18*  GLUCOSE 109* 136* 134*  BUN 15 18 18   CREATININE 1.00 1.06 1.07  CALCIUM 8.2* 8.4 7.9*   LFT  Basename 05/27/12 0355  PROT 6.0  ALBUMIN 2.1*  AST 77*  ALT 146*  ALKPHOS 209*  BILITOT 1.9*  BILIDIR --  IBILI --   PT/INR No results found for this basename: LABPROT:2,INR:2 in the last 72 hours Hepatitis Panel No results found for this basename: HEPBSAG,HCVAB,HEPAIGM,HEPBIGM in the last 72 hours C-Diff No results found for this basename: CDIFFTOX:3 in the last 72 hours Fecal Lactopherrin No results found for this basename: FECLLACTOFRN in the last 72 hours  Studies/Results: No results found.  Medications:  Scheduled:   . amLODipine  5 mg Oral Daily  . ciprofloxacin  500 mg Oral BID  . hydrALAZINE  25 mg Oral Q8H  . insulin aspart  0-15 Units Subcutaneous TID WC  . insulin aspart  0-5 Units Subcutaneous QHS  . lisinopril  40 mg Oral Daily  . senna-docusate  1 tablet Oral BID  . sodium chloride  3 mL Intravenous Q12H   Continuous:   . sodium chloride 50 mL/hr at  05/27/12 1100    Assessment/Plan: 1)  HCV cirrhosis. 2) Gallstone pancreatitis.   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  I do not know if there is a concomitant ETOH history as there was nobody to translate.  No further GI evaluation at this time, but he will need follow up in the office once his acute issues resolve.  Plan: 1) Cholecystectomy per surgery. 2) Signing off. 3) Follow up in 1-2 months.  LOS: 6 days   Caitlyne Ingham D 05/27/2012, 2:36 PM

## 2012-05-27 NOTE — Progress Notes (Signed)
Patient seen and examined.  If lipase continues to fall may do cholecystectomy tomorrow.

## 2012-05-28 ENCOUNTER — Inpatient Hospital Stay (HOSPITAL_COMMUNITY): Payer: Medicare Other

## 2012-05-28 DIAGNOSIS — E871 Hypo-osmolality and hyponatremia: Secondary | ICD-10-CM

## 2012-05-28 DIAGNOSIS — K7460 Unspecified cirrhosis of liver: Secondary | ICD-10-CM | POA: Diagnosis present

## 2012-05-28 LAB — CBC
MCH: 30.3 pg (ref 26.0–34.0)
MCHC: 34.7 g/dL (ref 30.0–36.0)
MCV: 87.3 fL (ref 78.0–100.0)
Platelets: 152 10*3/uL (ref 150–400)
RBC: 3.47 MIL/uL — ABNORMAL LOW (ref 4.22–5.81)
RDW: 13.8 % (ref 11.5–15.5)

## 2012-05-28 LAB — GLUCOSE, CAPILLARY

## 2012-05-28 LAB — COMPREHENSIVE METABOLIC PANEL
ALT: 117 U/L — ABNORMAL HIGH (ref 0–53)
AST: 73 U/L — ABNORMAL HIGH (ref 0–37)
Albumin: 2.1 g/dL — ABNORMAL LOW (ref 3.5–5.2)
Calcium: 8.1 mg/dL — ABNORMAL LOW (ref 8.4–10.5)
Creatinine, Ser: 0.97 mg/dL (ref 0.50–1.35)
GFR calc non Af Amer: 80 mL/min — ABNORMAL LOW (ref >90)
Sodium: 134 mEq/L — ABNORMAL LOW (ref 135–145)
Total Protein: 5.9 g/dL — ABNORMAL LOW (ref 6.0–8.3)

## 2012-05-28 MED ORDER — IOHEXOL 300 MG/ML  SOLN
100.0000 mL | Freq: Once | INTRAMUSCULAR | Status: AC | PRN
  Administered 2012-05-28: 100 mL via INTRAVENOUS

## 2012-05-28 NOTE — Progress Notes (Signed)
[facility] Progress Note  05/28/2012  Subjective: Pt is resting comfortably, in no distress, abdominal pain improved.   No nausea or vomiting.    Objective:  Vital signs in last 24 hours: Filed Vitals:   05/27/12 1450 05/27/12 2200 05/28/12 0505 05/28/12 1125  BP: 129/75 150/82 130/76 157/80  Pulse:  77 79   Temp: 97.7 F (36.5 C) 98.7 F (37.1 C) 98.7 F (37.1 C)   TempSrc: Oral Oral Oral   Resp: 16 16 16    Height:      Weight:      SpO2: 98% 98% 98%    Weight change:   Intake/Output Summary (Last 24 hours) at 05/28/12 1303 Last data filed at 05/28/12 1000  Gross per 24 hour  Intake   1510 ml  Output    300 ml  Net   1210 ml   No results found for this basename: HGBA1C   Lab Results  Component Value Date   CREATININE 0.97 05/28/2012    Review of Systems As above, otherwise all reviewed and reported negative  Physical Exam General - awake, no distress, cooperative HEENT - NCAT, MMM Lungs - BBS, CTA CV - normal s1, s2 sounds Abd - soft, nondistended, no masses, nontender Ext - no clubbing or cyanosis  Lab Results: Results for orders placed during the hospital encounter of 05/21/12 (from the past 24 hour(s))  GLUCOSE, CAPILLARY     Status: Normal   Collection Time   05/27/12  5:11 PM      Component Value Range   Glucose-Capillary 90  70 - 99 mg/dL  GLUCOSE, CAPILLARY     Status: Normal   Collection Time   05/27/12  9:57 PM      Component Value Range   Glucose-Capillary 95  70 - 99 mg/dL  CBC     Status: Abnormal   Collection Time   05/28/12  3:44 AM      Component Value Range   WBC 5.2  4.0 - 10.5 K/uL   RBC 3.47 (*) 4.22 - 5.81 MIL/uL   Hemoglobin 10.5 (*) 13.0 - 17.0 g/dL   HCT 16.1 (*) 09.6 - 04.5 %   MCV 87.3  78.0 - 100.0 fL   MCH 30.3  26.0 - 34.0 pg   MCHC 34.7  30.0 - 36.0 g/dL   RDW 40.9  81.1 - 91.4 %   Platelets 152  150 - 400 K/uL  COMPREHENSIVE METABOLIC PANEL     Status: Abnormal   Collection Time   05/28/12  3:44 AM   Component Value Range   Sodium 134 (*) 135 - 145 mEq/L   Potassium 3.6  3.5 - 5.1 mEq/L   Chloride 108  96 - 112 mEq/L   CO2 18 (*) 19 - 32 mEq/L   Glucose, Bld 89  70 - 99 mg/dL   BUN 14  6 - 23 mg/dL   Creatinine, Ser 7.82  0.50 - 1.35 mg/dL   Calcium 8.1 (*) 8.4 - 10.5 mg/dL   Total Protein 5.9 (*) 6.0 - 8.3 g/dL   Albumin 2.1 (*) 3.5 - 5.2 g/dL   AST 73 (*) 0 - 37 U/L   ALT 117 (*) 0 - 53 U/L   Alkaline Phosphatase 207 (*) 39 - 117 U/L   Total Bilirubin 1.6 (*) 0.3 - 1.2 mg/dL   GFR calc non Af Amer 80 (*) >90 mL/min   GFR calc Af Amer >90  >90 mL/min  LIPASE, BLOOD  Status: Abnormal   Collection Time   05/28/12  3:44 AM      Component Value Range   Lipase 253 (*) 11 - 59 U/L  AMYLASE     Status: Abnormal   Collection Time   05/28/12  3:44 AM      Component Value Range   Amylase 160 (*) 0 - 105 U/L  GLUCOSE, CAPILLARY     Status: Normal   Collection Time   05/28/12  7:11 AM      Component Value Range   Glucose-Capillary 98  70 - 99 mg/dL   Comment 1 Documented in Chart     Comment 2 Notify RN    GLUCOSE, CAPILLARY     Status: Abnormal   Collection Time   05/28/12 12:39 PM      Component Value Range   Glucose-Capillary 105 (*) 70 - 99 mg/dL   Comment 1 Documented in Chart     Comment 2 Notify RN      Micro Results: Recent Results (from the past 240 hour(s))  CULTURE, BLOOD (ROUTINE X 2)     Status: Normal   Collection Time   05/22/12 12:10 AM      Component Value Range Status Comment   Specimen Description BLOOD RIGHT ANTECUBITAL   Final    Special Requests BOTTLES DRAWN AEROBIC AND ANAEROBIC   Final    Culture  Setup Time 05/22/2012 09:10   Final    Culture     Final    Value: KLEBSIELLA PNEUMONIAE     Note: SUSCEPTIBILITIES PERFORMED ON PREVIOUS CULTURE WITHIN THE LAST 5 DAYS.     Note: Gram Stain Report Called to,Read Back By and Verified With: TRIPLIN S @ 2120 ON 05/22/12 BY GOLLD   Report Status 05/25/2012 FINAL   Final   CULTURE, BLOOD (ROUTINE X 2)      Status: Normal   Collection Time   05/22/12 12:15 AM      Component Value Range Status Comment   Specimen Description BLOOD LEFT ANTECUBITAL   Final    Special Requests BOTTLES DRAWN AEROBIC AND ANAEROBIC   Final    Culture  Setup Time 05/22/2012 09:10   Final    Culture     Final    Value: KLEBSIELLA PNEUMONIAE     Note: Gram Stain Report Called to,Read Back By and Verified With: TRIPLIN S @ 2120 ON 05/22/12 BY GOLLD   Report Status 05/25/2012 FINAL   Final    Organism ID, Bacteria KLEBSIELLA PNEUMONIAE   Final   URINE CULTURE     Status: Normal   Collection Time   05/22/12  1:31 AM      Component Value Range Status Comment   Specimen Description URINE, CLEAN CATCH   Final    Special Requests NONE   Final    Culture  Setup Time 05/22/2012 09:22   Final    Colony Count 15,000 COLONIES/ML   Final    Culture ENTEROCOCCUS SPECIES   Final    Report Status 05/25/2012 FINAL   Final    Organism ID, Bacteria ENTEROCOCCUS SPECIES   Final     Medications:  Scheduled Meds:   . amLODipine  5 mg Oral Daily  . ciprofloxacin  500 mg Oral BID  . hydrALAZINE  25 mg Oral Q8H  . insulin aspart  0-15 Units Subcutaneous TID WC  . insulin aspart  0-5 Units Subcutaneous QHS  . lisinopril  40 mg Oral Daily  .  senna-docusate  1 tablet Oral BID  . sodium chloride  3 mL Intravenous Q12H   Continuous Infusions:   . sodium chloride 50 mL/hr at 05/28/12 0121   PRN Meds:.iohexol, morphine injection, ondansetron (ZOFRAN) IV, ondansetron  Assessment/Plan: 1. Gallstone Pancreatitis- s/p ERCP, advance diet per GI, feeling better, no nausea (chronic cholecystitis- plan for gallbladder removal when Lipase and LFTs better), per surgery, awaiting CT results, following lipase levels   2. Gram negative rods in blood cultures most likely septic from CBD stone (klebsiella)- cipro PO- D/C invanz   3. Nausea & vomiting- resolved   4. Diarrhea- resolved   5. Hyponatremia- IVF d/c'd   6. Pancreatitis- lipase  > 3000 on admission, lipase decreased, abdominal pain better   7. AKI (acute kidney injury) - improved- old labs point to CKD but Cr stable WNL now   8. DM- stable on supplemental insulin, hold metformin and glipizide   9. Fever- abx , seems to be resolving   10. Chills- abx , resolved   11. HTN (hypertension)- restart lisinopril- increase dose as BP still high and add norvasc and PRN medication   12. Thrombocytopenia- ? related to infection, monitor- d/c heparin and give SCDs   13. Falls- consult PT for eval- recommend home health   14. HCV Cirrhosis/ liver spots on CT scan- order alpha feta protein WNL, MRI of the abdomen without contrast- suggest cirrhosis  per Dr. Elnoria Howard: suggestion of cirrhosis on the noncontrast scan with the possibility of hepatomas. HBV neg, HCV positive. Radiology mentioned that his gallbladder appeared to be "sick", but clinically he is well- surgery consulted. Follow up with Dr. Elnoria Howard outpatient once discharged and acute issues stable.    Code Status: full  Family Communication: family at bedside  Disposition Plan: home possibly with home health at D/C - lives with wife and 2 kids   LOS: 7 days   Miles Leyda 05/28/2012, 1:03 PM  Cleora Fleet, MD, CDE, FAAFP [facility] [place], [place]  [phone]

## 2012-05-28 NOTE — Progress Notes (Signed)
6 Days Post-Op  Subjective Family member say he denies any pain.  He is sitting up in bed drinking contrast.  Objective: Vital signs in last 24 hours: Temp:  [97.7 F (36.5 C)-98.7 F (37.1 C)] 98.7 F (37.1 C) (07/03 0505) Pulse Rate:  [77-79] 79  (07/03 0505) Resp:  [16] 16  (07/03 0505) BP: (129-154)/(75-84) 130/76 mmHg (07/03 0505) SpO2:  [97 %-98 %] 98 % (07/03 0505) Last BM Date: 05/25/12  Intake/Output from previous day: 07/02 0701 - 07/03 0700 In: 1250 [P.O.:600; I.V.:650] Out: 700 [Urine:700] Intake/Output this shift:    General appearance: alert, cooperative and no distress GI: soft, non-tender; bowel sounds normal; no masses,  no organomegaly  Lab Results:   Basename 05/28/12 0344 05/27/12 0355  WBC 5.2 5.6  HGB 10.5* 10.7*  HCT 30.3* 30.6*  PLT 152 111*    BMET  Basename 05/28/12 0344 05/27/12 0355  NA 134* 134*  K 3.6 3.7  CL 108 108  CO2 18* 18*  GLUCOSE 89 109*  BUN 14 15  CREATININE 0.97 1.00  CALCIUM 8.1* 8.2*   PT/INR No results found for this basename: LABPROT:2,INR:2 in the last 72 hours   Lab 05/28/12 0344 05/27/12 0355 05/26/12 0845 05/25/12 0449 05/24/12 0433  AST 73* 77* 88* 116* 191*  ALT 117* 146* 188* 234* 324*  ALKPHOS 207* 209* 221* 186* 146*  BILITOT 1.6* 1.9* 2.4* 2.3* 3.0*  PROT 5.9* 6.0 6.6 6.0 5.9*  ALBUMIN 2.1* 2.1* 2.2* 2.0* 2.1*   Lipase       253                      293                  571                 520            441            Started at > 3000 (6/26)  Lipase     Component Value Date/Time   LIPASE 253* 05/28/2012 0344     Studies/Results: No results found.  Medications:    . amLODipine  5 mg Oral Daily  . ciprofloxacin  500 mg Oral BID  . hydrALAZINE  25 mg Oral Q8H  . insulin aspart  0-15 Units Subcutaneous TID WC  . insulin aspart  0-5 Units Subcutaneous QHS  . lisinopril  40 mg Oral Daily  . senna-docusate  1 tablet Oral BID  . sodium chloride  3 mL Intravenous Q12H     Assessment/Plan 1) Gallstone pancreatitis. S/p ERCP 05/22/12 Dr. Elnoria Howard  2) Cirrhosis.  3) Bateremia/Ascending cholangitis/Klebsiella ONE Culture.  4) Enterococcal UTI.  5) ? Chest pain, Echo pending 6)   HEPATITIS B    Hepatitis B Surface Ag       NEGATIVE      Hep B S Ab       POSITIVE      Hep B Core Total Ab       POSITIVE        HEPATITIS C    HCV Ab       Reactive       Plan:  Repeat CT, to look at Pancrease.  Dr. Abbey Chatters wants lipase back to normal before he has surgery.        LOS: 7 days    Christian Wallace 05/28/2012

## 2012-05-28 NOTE — Progress Notes (Signed)
Patient seen and examined.  Awaiting CT results.  If he does not have significant inflammatory changes on CT, will try clear liquids and monitor lipase levels.

## 2012-05-29 DIAGNOSIS — K859 Acute pancreatitis without necrosis or infection, unspecified: Principal | ICD-10-CM

## 2012-05-29 LAB — COMPREHENSIVE METABOLIC PANEL
ALT: 97 U/L — ABNORMAL HIGH (ref 0–53)
Albumin: 2.1 g/dL — ABNORMAL LOW (ref 3.5–5.2)
Alkaline Phosphatase: 194 U/L — ABNORMAL HIGH (ref 39–117)
Glucose, Bld: 79 mg/dL (ref 70–99)
Potassium: 3.5 mEq/L (ref 3.5–5.1)
Sodium: 136 mEq/L (ref 135–145)
Total Protein: 5.8 g/dL — ABNORMAL LOW (ref 6.0–8.3)

## 2012-05-29 LAB — CBC
Hemoglobin: 9.8 g/dL — ABNORMAL LOW (ref 13.0–17.0)
MCHC: 34.4 g/dL (ref 30.0–36.0)
WBC: 5 10*3/uL (ref 4.0–10.5)

## 2012-05-29 LAB — GLUCOSE, CAPILLARY
Glucose-Capillary: 88 mg/dL (ref 70–99)
Glucose-Capillary: 130 mg/dL — ABNORMAL HIGH (ref 70–99)

## 2012-05-29 NOTE — Plan of Care (Signed)
Problem: Phase I Progression Outcomes Goal: Hemodynamically stable Outcome: Progressing Lipase level is decreasing.  Comments:  Continue monitor lipase level.

## 2012-05-29 NOTE — Progress Notes (Signed)
Patient ID: Christian Wallace, male   DOB: 1941-08-25, 71 y.o.   MRN: [id number]  General Surgery - [facility] - Progress Note  Subjective: Patient without complaints.  Family member at bedside to translate.  No pain.  No nausea.  Objective: Vital signs in last 24 hours: Temp:  [98 F (36.7 C)-98.3 F (36.8 C)] 98 F (36.7 C) (07/04 0616) Pulse Rate:  [73-78] 73  (07/04 0616) Resp:  [16] 16  (07/04 0616) BP: (127-157)/(70-84) 127/70 mmHg (07/04 0906) SpO2:  [96 %-98 %] 98 % (07/04 0616) Last BM Date: 05/27/12  Intake/Output from previous day: 07/03 0701 - 07/04 0700 In: 2032.5 [P.O.:600; I.V.:1432.5] Out: -   Exam: HEENT - clear, not icteric Neck - soft Chest - clear bilaterally Cor - RRR, no murmur Abd - soft without distension; BS present; no tenderness; no mass Ext - no significant edema Neuro - grossly intact, no focal deficits  Lab Results:   Basename 05/29/12 0345 05/28/12 0344  WBC 5.0 5.2  HGB 9.8* 10.5*  HCT 28.5* 30.3*  PLT 162 152     Basename 05/29/12 0345 05/28/12 0344  NA 136 134*  K 3.5 3.6  CL 109 108  CO2 18* 18*  GLUCOSE 79 89  BUN 13 14  CREATININE 1.02 0.97  CALCIUM 7.9* 8.1*    Studies/Results: Ct Abdomen Pelvis W Contrast  05/28/2012  *RADIOLOGY REPORT*  Clinical Data: Follow up pancreatitis.  CT ABDOMEN AND PELVIS WITH CONTRAST  Technique:  Multidetector CT imaging of the abdomen and pelvis was performed following the standard protocol during bolus administration of intravenous contrast.  Contrast: OMNIPAQUE IOHEXOL 300 MG/ML  SOLN  Comparison: MR abdomen 05/23/2012 and CT abdomen pelvis 05/22/2012.  Findings: Lung bases show small bilateral pleural effusions with mild bilateral lower lobe air space opacification.  Heart is at the upper limits of normal in size.  Coronary artery calcification is prominent.  No pericardial effusion.  Respiratory motion somewhat degrades image quality.  Liver margin is irregular and parenchyma is  somewhat heterogeneous.  Several stones are seen in the gallbladder.  Gallbladder wall appears thickened.  Adrenal glands, kidneys, spleen, pancreas, stomach and bowel are unremarkable.  Atherosclerotic calcification of the arterial vasculature without abdominal aortic aneurysm.  Trace pelvic free fluid. No pathologically enlarged lymph nodes.  No worrisome lytic or sclerotic lesions.  IMPRESSION:  1.  Respiratory motion degrades image quality.  No definite evidence of residual acute pancreatitis. 2.  Small bilateral pleural effusions with mild bilateral lower lobe air space disease, right greater than left. 3.  Extensive coronary artery calcification. 4.  Cirrhosis. 5.  Cholelithiasis with wall thickening, which can be seen with chronic cholecystitis.  Original Report Authenticated By: Reyes Ivan, M.D.    Assessment / Plan: 1.  Biliary pancreatitis  - CT scan shows thick walled gallbladder, pancreas improved  - lipase remains elevated at 202, down from 253  - cholecystectomy certainly high risk procedure given cirrhosis  - will check lipase in AM and follow up  Velora Heckler, MD, [facility] Office: [phone]  05/29/2012

## 2012-05-29 NOTE — Progress Notes (Signed)
[facility] Progress Note  05/29/2012   Subjective: Pt says he is feeling better.  He is ambulating in room.   Objective:  Vital signs in last 24 hours: Filed Vitals:   05/28/12 1514 05/28/12 2152 05/29/12 0616 05/29/12 0906  BP: 157/79 147/77 136/72 127/70  Pulse:  78 73   Temp:  98.3 F (36.8 C) 98 F (36.7 C)   TempSrc:  Oral Oral   Resp:  16 16   Height:      Weight:      SpO2:  96% 98%    Weight change:   Intake/Output Summary (Last 24 hours) at 05/29/12 1217 Last data filed at 05/29/12 0957  Gross per 24 hour  Intake   2080 ml  Output      0 ml  Net   2080 ml   No results found for this basename: HGBA1C   Lab Results  Component Value Date   CREATININE 1.02 05/29/2012    Review of Systems As above, otherwise all reviewed and reported negative  Physical Exam General - awake, no distress, cooperative HEENT - NCAT, MMM Lungs - BBS, CTA CV - normal s1, s2 sounds Abd - soft, nondistended, no masses, nontender Ext - no C/C/E  Lab Results: Results for orders placed during the hospital encounter of 05/21/12 (from the past 24 hour(s))  GLUCOSE, CAPILLARY     Status: Abnormal   Collection Time   05/28/12 12:39 PM      Component Value Range   Glucose-Capillary 105 (*) 70 - 99 mg/dL   Comment 1 Documented in Chart     Comment 2 Notify RN    GLUCOSE, CAPILLARY     Status: Normal   Collection Time   05/28/12  5:50 PM      Component Value Range   Glucose-Capillary 81  70 - 99 mg/dL  GLUCOSE, CAPILLARY     Status: Normal   Collection Time   05/28/12  9:49 PM      Component Value Range   Glucose-Capillary 84  70 - 99 mg/dL  CBC     Status: Abnormal   Collection Time   05/29/12  3:45 AM      Component Value Range   WBC 5.0  4.0 - 10.5 K/uL   RBC 3.26 (*) 4.22 - 5.81 MIL/uL   Hemoglobin 9.8 (*) 13.0 - 17.0 g/dL   HCT 16.1 (*) 09.6 - 04.5 %   MCV 87.4  78.0 - 100.0 fL   MCH 30.1  26.0 - 34.0 pg   MCHC 34.4  30.0 - 36.0 g/dL   RDW 40.9  81.1 - 91.4 %   Platelets 162  150 - 400 K/uL  COMPREHENSIVE METABOLIC PANEL     Status: Abnormal   Collection Time   05/29/12  3:45 AM      Component Value Range   Sodium 136  135 - 145 mEq/L   Potassium 3.5  3.5 - 5.1 mEq/L   Chloride 109  96 - 112 mEq/L   CO2 18 (*) 19 - 32 mEq/L   Glucose, Bld 79  70 - 99 mg/dL   BUN 13  6 - 23 mg/dL   Creatinine, Ser 7.82  0.50 - 1.35 mg/dL   Calcium 7.9 (*) 8.4 - 10.5 mg/dL   Total Protein 5.8 (*) 6.0 - 8.3 g/dL   Albumin 2.1 (*) 3.5 - 5.2 g/dL   AST 62 (*) 0 - 37 U/L   ALT 97 (*) 0 - 53  U/L   Alkaline Phosphatase 194 (*) 39 - 117 U/L   Total Bilirubin 1.4 (*) 0.3 - 1.2 mg/dL   GFR calc non Af Amer 71 (*) >90 mL/min   GFR calc Af Amer 82 (*) >90 mL/min  LIPASE, BLOOD     Status: Abnormal   Collection Time   05/29/12  3:45 AM      Component Value Range   Lipase 202 (*) 11 - 59 U/L  GLUCOSE, CAPILLARY     Status: Normal   Collection Time   05/29/12  7:21 AM      Component Value Range   Glucose-Capillary 88  70 - 99 mg/dL   Comment 1 Notify RN     Comment 2 Documented in Chart    GLUCOSE, CAPILLARY     Status: Abnormal   Collection Time   05/29/12 12:01 PM      Component Value Range   Glucose-Capillary 130 (*) 70 - 99 mg/dL    Micro Results: Recent Results (from the past 240 hour(s))  CULTURE, BLOOD (ROUTINE X 2)     Status: Normal   Collection Time   05/22/12 12:10 AM      Component Value Range Status Comment   Specimen Description BLOOD RIGHT ANTECUBITAL   Final    Special Requests BOTTLES DRAWN AEROBIC AND ANAEROBIC   Final    Culture  Setup Time 05/22/2012 09:10   Final    Culture     Final    Value: KLEBSIELLA PNEUMONIAE     Note: SUSCEPTIBILITIES PERFORMED ON PREVIOUS CULTURE WITHIN THE LAST 5 DAYS.     Note: Gram Stain Report Called to,Read Back By and Verified With: TRIPLIN S @ 2120 ON 05/22/12 BY GOLLD   Report Status 05/25/2012 FINAL   Final   CULTURE, BLOOD (ROUTINE X 2)     Status: Normal   Collection Time   05/22/12 12:15 AM       Component Value Range Status Comment   Specimen Description BLOOD LEFT ANTECUBITAL   Final    Special Requests BOTTLES DRAWN AEROBIC AND ANAEROBIC   Final    Culture  Setup Time 05/22/2012 09:10   Final    Culture     Final    Value: KLEBSIELLA PNEUMONIAE     Note: Gram Stain Report Called to,Read Back By and Verified With: TRIPLIN S @ 2120 ON 05/22/12 BY GOLLD   Report Status 05/25/2012 FINAL   Final    Organism ID, Bacteria KLEBSIELLA PNEUMONIAE   Final   URINE CULTURE     Status: Normal   Collection Time   05/22/12  1:31 AM      Component Value Range Status Comment   Specimen Description URINE, CLEAN CATCH   Final    Special Requests NONE   Final    Culture  Setup Time 05/22/2012 09:22   Final    Colony Count 15,000 COLONIES/ML   Final    Culture ENTEROCOCCUS SPECIES   Final    Report Status 05/25/2012 FINAL   Final    Organism ID, Bacteria ENTEROCOCCUS SPECIES   Final     Medications:  Scheduled Meds:   . amLODipine  5 mg Oral Daily  . ciprofloxacin  500 mg Oral BID  . hydrALAZINE  25 mg Oral Q8H  . insulin aspart  0-15 Units Subcutaneous TID WC  . insulin aspart  0-5 Units Subcutaneous QHS  . lisinopril  40 mg Oral Daily  . senna-docusate  1  tablet Oral BID  . sodium chloride  3 mL Intravenous Q12H   Continuous Infusions:   . sodium chloride 50 mL/hr at 05/28/12 2212   PRN Meds:.morphine injection, ondansetron (ZOFRAN) IV, ondansetron  Assessment/Plan: 1. Gallstone Pancreatitis- s/p ERCP, advance diet per GI, feeling better, no nausea (chronic cholecystitis- ? gallbladder removal when Lipase and LFTs better), per surgery, awaiting CT results, following lipase levels, lipase slowly trending down, now 202  2. Gram negative rods in blood cultures most likely septic from CBD stone (klebsiella)- cipro PO   3. Nausea & vomiting- resolved   4. Diarrhea- resolved   5. Hyponatremia- resolved, IVF d/c'd   6. Pancreatitis- lipase > 3000 on admission, lipase decreased,  abdominal pain better   7. AKI (acute kidney injury) - resolved- old labs point to CKD but Cr stable WNL now   8. DM- stable on supplemental insulin, hold metformin and glipizide   9. Fever- abx , seems to be resolving   10. Chills- abx , resolved   11. HTN (hypertension)- controlled with lisinopril and amlodipine  12. Thrombocytopenia- resolved now.  ? related to infection, monitor- d/c heparin and give SCDs   13. 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 Elnoria Howard: suggestion of cirrhosis on the noncontrast scan with the possibility of hepatomas. HBV neg, HCV positive.  Follow up with Dr. Elnoria Howard outpatient once discharged and acute issues stable.   Code Status: full  Family Communication: family at bedside updated by surgeon  Disposition Plan: home possibly with home health at D/C - lives with wife and 2 kids   LOS: 8 days   Clanford Johnson 05/29/2012, 12:17 PM  Cleora Fleet, MD, CDE, FAAFP [facility] [place], [place]  [phone]

## 2012-05-30 LAB — LIPASE, BLOOD: Lipase: 188 U/L — ABNORMAL HIGH (ref 11–59)

## 2012-05-30 LAB — GLUCOSE, CAPILLARY
Glucose-Capillary: 193 mg/dL — ABNORMAL HIGH (ref 70–99)
Glucose-Capillary: 103 mg/dL — ABNORMAL HIGH (ref 70–99)

## 2012-05-30 NOTE — Progress Notes (Signed)
Physical Therapy Treatment----D/C from PT Services Patient Details Name: Christian Wallace MRN: [id number] DOB: 09/24/1941 Today's Date: 05/30/2012 Time: 1044-1100 PT Time Calculation (min): 16 min  PT Assessment / Plan / Recommendation Comments on Treatment Session  as of  today it appears no further surgery is planned; pt doing well from PT standpoint; should be able to continue to mobilize with nursing staff    Follow Up Recommendations  No PT follow up    Barriers to Discharge        Equipment Recommendations  None recommended by PT    Recommendations for Other Services    Frequency     Plan All goals met and education completed, patient dischaged from PT services;Equipment recommendations need to be updated    Precautions / Restrictions Precautions Precautions: Fall Restrictions Weight Bearing Restrictions: No   Pertinent Vitals/Pain     Mobility  Bed Mobility Bed Mobility: Supine to Sit;Sit to Supine Supine to Sit: 6: Modified independent (Device/Increase time) Sitting - Scoot to Edge of Bed: 7: Independent Sit to Supine: 7: Independent Transfers Transfers: Stand to Sit;Sit to Stand Sit to Stand: 7: Independent;From bed;From chair/3-in-1 Stand to Sit: 7: Independent;To bed;To chair/3-in-1 Ambulation/Gait Ambulation/Gait Assistance: 5: Supervision;7: Independent (for IV management) Ambulation Distance (Feet): 375 Feet Assistive device: None Ambulation/Gait Assistance Details: pt amb 10' with RW to bathroom; pt with good stability, amb in hallway without AD and did well, no LOB Gait Pattern: Within Functional Limits Gait velocity: rapid    Exercises     PT Diagnosis:    PT Problem List:   PT Treatment Interventions:     PT Goals Acute Rehab PT Goals Time For Goal Achievement: 05/31/12 Potential to Achieve Goals: Good Pt will go Supine/Side to Sit: with modified independence PT Goal: Supine/Side to Sit - Progress: Met Pt will go Sit to Stand: with modified  independence PT Goal: Sit to Stand - Progress: Met Pt will Ambulate: >150 feet;with supervision;with rolling walker PT Goal: Ambulate - Progress: Met  Visit Information  Last PT Received On: 05/30/12 Assistance Needed: +1    Subjective Data  Subjective: pt resting in bed, no c/os   Cognition  Overall Cognitive Status: Appears within functional limits for tasks assessed/performed (however pt non-English speaking) Difficult to assess due to: Non-English speaking Arousal/Alertness: Awake/alert Behavior During Session: [facility] for tasks performed    Balance     End of Session PT - End of Session Activity Tolerance: Patient tolerated treatment well Patient left: in bed;with call bell/phone within reach   GP     [facility] 05/30/2012, 11:12 AM

## 2012-05-30 NOTE — Progress Notes (Signed)
Patient seen and examined.  Agree with PA's note.  

## 2012-05-30 NOTE — Progress Notes (Signed)
8 Days Post-Op  Subjective: As usual no complaints, no family in Room and [name]  Objective: Vital signs in last 24 hours: Temp:  [98 F (36.7 C)-98.2 F (36.8 C)] 98.2 F (36.8 C) (07/05 0542) Pulse Rate:  [70-73] 73  (07/05 0542) Resp:  [14-16] 14  (07/05 0542) BP: (127-152)/(70-81) 150/71 mmHg (07/05 0542) SpO2:  [97 %-100 %] 98 % (07/05 0542) Last BM Date: 05/27/12  Intake/Output from previous day: 07/04 0701 - 07/05 0700 In: 1330 [P.O.:480; I.V.:850] Out: 700 [Urine:700] Intake/Output this shift:    General appearance: alert, cooperative and no distress GI: soft, non-tender; bowel sounds normal; no masses,  no organomegaly  Lab Results:   [facility] 05/29/12 0345 05/28/12 0344  WBC 5.0 5.2  HGB 9.8* 10.5*  HCT 28.5* 30.3*  PLT 162 152    BMET  Basename 05/29/12 0345 05/28/12 0344  NA 136 134*  K 3.5 3.6  CL 109 108  CO2 18* 18*  GLUCOSE 79 89  BUN 13 14  CREATININE 1.02 0.97  CALCIUM 7.9* 8.1*   PT/INR No results found for this basename: LABPROT:2,INR:2 in the last 72 hours   Lab 05/29/12 0345 05/28/12 0344 05/27/12 0355 05/26/12 0845 05/25/12 0449  AST 62* 73* 77* 88* 116*  ALT 97* 117* 146* 188* 234*  ALKPHOS 194* 207* 209* 221* 186*  BILITOT 1.4* 1.6* 1.9* 2.4* 2.3*  PROT 5.8* 5.9* 6.0 6.6 6.0  ALBUMIN 2.1* 2.1* 2.1* 2.2* 2.0*     Lipase     Component Value Date/Time   LIPASE 188* 05/30/2012 0405     Studies/Results: Ct Abdomen Pelvis W Contrast  05/28/2012  *RADIOLOGY REPORT*  Clinical Data: Follow up pancreatitis.  CT ABDOMEN AND PELVIS WITH CONTRAST  Technique:  Multidetector CT imaging of the abdomen and pelvis was performed following the standard protocol during bolus administration of intravenous contrast.  Contrast: OMNIPAQUE IOHEXOL 300 MG/ML  SOLN  Comparison: MR abdomen 05/23/2012 and CT abdomen pelvis 05/22/2012.  Findings: Lung bases show small bilateral pleural effusions with mild bilateral lower lobe air  space opacification.  Heart is at the upper limits of normal in size.  Coronary artery calcification is prominent.  No pericardial effusion.  Respiratory motion somewhat degrades image quality.  Liver margin is irregular and parenchyma is somewhat heterogeneous.  Several stones are seen in the gallbladder.  Gallbladder wall appears thickened.  Adrenal glands, kidneys, spleen, pancreas, stomach and bowel are unremarkable.  Atherosclerotic calcification of the arterial vasculature without abdominal aortic aneurysm.  Trace pelvic free fluid. No pathologically enlarged lymph nodes.  No worrisome lytic or sclerotic lesions.  IMPRESSION:  1.  Respiratory motion degrades image quality.  No definite evidence of residual acute pancreatitis. 2.  Small bilateral pleural effusions with mild bilateral lower lobe air space disease, right greater than left. 3.  Extensive coronary artery calcification. 4.  Cirrhosis. 5.  Cholelithiasis with wall thickening, which can be seen with chronic cholecystitis.  Original Report Authenticated By: Reyes Ivan, M.D.    Medications:    . amLODipine  5 mg Oral Daily  . ciprofloxacin  500 mg Oral BID  . hydrALAZINE  25 mg Oral Q8H  . insulin aspart  0-15 Units Subcutaneous TID WC  . insulin aspart  0-5 Units Subcutaneous QHS  . lisinopril  40 mg Oral Daily  . senna-docusate  1 tablet Oral BID  . sodium chloride  3 mL Intravenous Q12H    Assessment/Plan 1) Gallstone pancreatitis. S/p ERCP  05/22/12 Dr. Elnoria Howard  2) Cirrhosis.  3) Bateremia/Ascending cholangitis/Klebsiella ONE Culture.  4) Enterococcal UTI.  5) ? Chest pain, Echo pending 6.Hep C, reactive, Hep B surface antibodies +, Hep B Core Total antibodies +.   Lipase down to  188, will place on full liquids and see how he does, Lipase went up last time with PO's.      LOS: 9 days    Christian Wallace 05/30/2012

## 2012-05-30 NOTE — Progress Notes (Signed)
[facility] Progress Note  05/30/2012   Subjective: Pt tolerated full liquid diet for lunch today.  No complaints.   Objective:  Vital signs in last 24 hours: Filed Vitals:   05/29/12 2230 05/30/12 0542 05/30/12 0955 05/30/12 1340  BP: 132/74 150/71 141/69 112/70  Pulse: 70 73 70 81  Temp: 98.2 F (36.8 C) 98.2 F (36.8 C)  98 F (36.7 C)  TempSrc: Oral Oral  Oral  Resp: 16 14  16   Height:      Weight:      SpO2: 97% 98% 98% 97%   Weight change:   Intake/Output Summary (Last 24 hours) at 05/30/12 1433 Last data filed at 05/30/12 1308  Gross per 24 hour  Intake    810 ml  Output    700 ml  Net    110 ml   No results found for this basename: HGBA1C   Lab Results  Component Value Date   CREATININE 1.02 05/29/2012    Review of Systems As above, otherwise all reviewed and reported negative  Physical Exam General - awake, no distress, cooperative HEENT - NCAT, MMM Lungs - BBS, CTA CV - normal s1, s2 sounds Abd - soft, nondistended, no masses, nontender Ext - no C/C/E  Lab Results: Results for orders placed during the hospital encounter of 05/21/12 (from the past 24 hour(s))  GLUCOSE, CAPILLARY     Status: Normal   Collection Time   05/29/12  5:23 PM      Component Value Range   Glucose-Capillary 94  70 - 99 mg/dL   Comment 1 Notify RN     Comment 2 Documented in Chart    GLUCOSE, CAPILLARY     Status: Abnormal   Collection Time   05/29/12 10:36 PM      Component Value Range   Glucose-Capillary 116 (*) 70 - 99 mg/dL  LIPASE, BLOOD     Status: Abnormal   Collection Time   05/30/12  4:05 AM      Component Value Range   Lipase 188 (*) 11 - 59 U/L  GLUCOSE, CAPILLARY     Status: Abnormal   Collection Time   05/30/12  8:00 AM      Component Value Range   Glucose-Capillary 193 (*) 70 - 99 mg/dL  GLUCOSE, CAPILLARY     Status: Normal   Collection Time   05/30/12 11:46 AM      Component Value Range   Glucose-Capillary 75  70 - 99 mg/dL    Micro  Results: Recent Results (from the past 240 hour(s))  CULTURE, BLOOD (ROUTINE X 2)     Status: Normal   Collection Time   05/22/12 12:10 AM      Component Value Range Status Comment   Specimen Description BLOOD RIGHT ANTECUBITAL   Final    Special Requests BOTTLES DRAWN AEROBIC AND ANAEROBIC   Final    Culture  Setup Time 05/22/2012 09:10   Final    Culture     Final    Value: KLEBSIELLA PNEUMONIAE     Note: SUSCEPTIBILITIES PERFORMED ON PREVIOUS CULTURE WITHIN THE LAST 5 DAYS.     Note: Gram Stain Report Called to,Read Back By and Verified With: TRIPLIN S @ 2120 ON 05/22/12 BY GOLLD   Report Status 05/25/2012 FINAL   Final   CULTURE, BLOOD (ROUTINE X 2)     Status: Normal   Collection Time   05/22/12 12:15 AM      Component Value Range  Status Comment   Specimen Description BLOOD LEFT ANTECUBITAL   Final    Special Requests BOTTLES DRAWN AEROBIC AND ANAEROBIC   Final    Culture  Setup Time 05/22/2012 09:10   Final    Culture     Final    Value: KLEBSIELLA PNEUMONIAE     Note: Gram Stain Report Called to,Read Back By and Verified With: TRIPLIN S @ 2120 ON 05/22/12 BY GOLLD   Report Status 05/25/2012 FINAL   Final    Organism ID, Bacteria KLEBSIELLA PNEUMONIAE   Final   URINE CULTURE     Status: Normal   Collection Time   05/22/12  1:31 AM      Component Value Range Status Comment   Specimen Description URINE, CLEAN CATCH   Final    Special Requests NONE   Final    Culture  Setup Time 05/22/2012 09:22   Final    Colony Count 15,000 COLONIES/ML   Final    Culture ENTEROCOCCUS SPECIES   Final    Report Status 05/25/2012 FINAL   Final    Organism ID, Bacteria ENTEROCOCCUS SPECIES   Final     Medications:  Scheduled Meds:   . amLODipine  5 mg Oral Daily  . ciprofloxacin  500 mg Oral BID  . hydrALAZINE  25 mg Oral Q8H  . insulin aspart  0-15 Units Subcutaneous TID WC  . insulin aspart  0-5 Units Subcutaneous QHS  . lisinopril  40 mg Oral Daily  . senna-docusate  1 tablet  Oral BID  . sodium chloride  3 mL Intravenous Q12H   Continuous Infusions:   . sodium chloride 50 mL/hr at 05/30/12 1430   PRN Meds:.morphine injection, ondansetron (ZOFRAN) IV, ondansetron  Assessment/Plan: 1. Gallstone Pancreatitis- s/p ERCP, advance diet per GI, feeling better, no nausea (chronic cholecystitis- ? gallbladder removal when Lipase and LFTs better), per surgery, awaiting CT results, following lipase levels, lipase slowly trending down, now 188   2. Gram negative rods in blood cultures most likely septic from CBD stone (klebsiella)- continue cipro PO   3. Nausea & vomiting- resolved   4. Diarrhea- resolved   5. Hyponatremia- resolved, IVF d/c'd   6. Pancreatitis- lipase > 3000 on admission, lipase decreased, abdominal pain better - advancing to full liquid diet today  7. AKI (acute kidney injury) - resolved- old labs point to CKD but Cr stable WNL now   8. DM- stable on supplemental insulin, hold metformin and glipizide   9. Fever- abx , seems to be resolving   10. Chills- abx , resolved   11. HTN (hypertension)- controlled with lisinopril and amlodipine   12. Thrombocytopenia- resolved now. ? related to infection, monitor- d/c heparin and give SCDs   13. 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 Elnoria Howard: suggestion of cirrhosis on the noncontrast scan with the possibility of hepatomas. HBV neg, HCV positive. Follow up with Dr. Elnoria Howard outpatient once discharged and acute issues stable.   Code Status: full  Family Communication: family at bedside updated by surgeon  Disposition Plan: home possibly with home health at D/C - lives with wife and 2 kids   LOS: 9 days   Earnie Bechard 05/30/2012, 2:33 PM  Cleora Fleet, MD, CDE, FAAFP [facility] [place], [place]  [phone]

## 2012-05-31 DIAGNOSIS — D696 Thrombocytopenia, unspecified: Secondary | ICD-10-CM

## 2012-05-31 LAB — GLUCOSE, CAPILLARY
Glucose-Capillary: 204 mg/dL — ABNORMAL HIGH (ref 70–99)
Glucose-Capillary: 147 mg/dL — ABNORMAL HIGH (ref 70–99)

## 2012-05-31 LAB — COMPREHENSIVE METABOLIC PANEL
ALT: 83 U/L — ABNORMAL HIGH (ref 0–53)
CO2: 20 mEq/L (ref 19–32)
Calcium: 8.1 mg/dL — ABNORMAL LOW (ref 8.4–10.5)
Chloride: 110 mEq/L (ref 96–112)
GFR calc Af Amer: >90 mL/min (ref >90)
GFR calc non Af Amer: 81 mL/min — ABNORMAL LOW (ref >90)
Glucose, Bld: 114 mg/dL — ABNORMAL HIGH (ref 70–99)
Sodium: 137 mEq/L (ref 135–145)
Total Bilirubin: 1.1 mg/dL (ref 0.3–1.2)

## 2012-05-31 NOTE — Progress Notes (Signed)
Patient ID: Christian Wallace, male   DOB: 1941-11-11, 71 y.o.   MRN: [id number] [facility] Surgery Progress Note:   9 Days Post-Op  Subjective: Mental status is clear.  Family with interpreters present.  Not hurting.  Wants to advance diet.   Objective: Vital signs in last 24 hours: Temp:  [98 F (36.7 C)-98.7 F (37.1 C)] 98.7 F (37.1 C) (07/06 7829) Pulse Rate:  [68-81] 68  (07/06 0632) Resp:  [16-18] 16  (07/06 0632) BP: (112-144)/(70-78) 127/70 mmHg (07/06 0632) SpO2:  [97 %] 97 % (07/06 5621)  Intake/Output from previous day: 07/05 0701 - 07/06 0700 In: 2200 [P.O.:600; I.V.:1600] Out: -  Intake/Output this shift: Total I/O In: 120 [P.O.:120] Out: -   Physical Exam: Work of breathing is  Normal.  Minimal abdominal pain   Lab Results:  Results for orders placed during the hospital encounter of 05/21/12 (from the past 48 hour(s))  GLUCOSE, CAPILLARY     Status: Abnormal   Collection Time   05/29/12 12:01 PM      Component Value Range Comment   Glucose-Capillary 130 (*) 70 - 99 mg/dL   GLUCOSE, CAPILLARY     Status: Normal   Collection Time   05/29/12  5:23 PM      Component Value Range Comment   Glucose-Capillary 94  70 - 99 mg/dL    Comment 1 Notify RN      Comment 2 Documented in Chart     GLUCOSE, CAPILLARY     Status: Abnormal   Collection Time   05/29/12 10:36 PM      Component Value Range Comment   Glucose-Capillary 116 (*) 70 - 99 mg/dL   LIPASE, BLOOD     Status: Abnormal   Collection Time   05/30/12  4:05 AM      Component Value Range Comment   Lipase 188 (*) 11 - 59 U/L   GLUCOSE, CAPILLARY     Status: Abnormal   Collection Time   05/30/12  8:00 AM      Component Value Range Comment   Glucose-Capillary 193 (*) 70 - 99 mg/dL   GLUCOSE, CAPILLARY     Status: Normal   Collection Time   05/30/12 11:46 AM      Component Value Range Comment   Glucose-Capillary 75  70 - 99 mg/dL   GLUCOSE, CAPILLARY     Status: Abnormal   Collection Time   05/30/12  5:24 PM   Component Value Range Comment   Glucose-Capillary 157 (*) 70 - 99 mg/dL   GLUCOSE, CAPILLARY     Status: Abnormal   Collection Time   05/30/12  9:14 PM      Component Value Range Comment   Glucose-Capillary 103 (*) 70 - 99 mg/dL   COMPREHENSIVE METABOLIC PANEL     Status: Abnormal   Collection Time   05/31/12  4:46 AM      Component Value Range Comment   Sodium 137  135 - 145 mEq/L    Potassium 3.5  3.5 - 5.1 mEq/L    Chloride 110  96 - 112 mEq/L    CO2 20  19 - 32 mEq/L    Glucose, Bld 114 (*) 70 - 99 mg/dL    BUN 9  6 - 23 mg/dL    Creatinine, Ser 3.08  0.50 - 1.35 mg/dL    Calcium 8.1 (*) 8.4 - 10.5 mg/dL    Total Protein 5.8 (*) 6.0 - 8.3 g/dL    Albumin  2.1 (*) 3.5 - 5.2 g/dL    AST 66 (*) 0 - 37 U/L    ALT 83 (*) 0 - 53 U/L    Alkaline Phosphatase 186 (*) 39 - 117 U/L    Total Bilirubin 1.1  0.3 - 1.2 mg/dL    GFR calc non Af Amer 81 (*) >90 mL/min    GFR calc Af Amer >90  >90 mL/min   LIPASE, BLOOD     Status: Abnormal   Collection Time   05/31/12  4:46 AM      Component Value Range Comment   Lipase 178 (*) 11 - 59 U/L     Radiology/Results: No results found.  Anti-infectives: Anti-infectives     Start     Dose/Rate Route Frequency Ordered Stop   05/24/12 1015   ciprofloxacin (CIPRO) tablet 500 mg        500 mg Oral 2 times daily 05/24/12 0923     05/23/12 0600   ertapenem (INVANZ) 1 g in sodium chloride 0.9 % 50 mL IVPB  Status:  Discontinued        1 g 100 mL/hr over 30 Minutes Intravenous Every 24 hours 05/22/12 1215 05/25/12 0816   05/22/12 1500   ampicillin-sulbactam (UNASYN) 1.5 g in sodium chloride 0.9 % 50 mL IVPB        1.5 g 100 mL/hr over 30 Minutes Intravenous  Once 05/22/12 1457 05/22/12 1527   05/22/12 0615   ertapenem (INVANZ) 1 g in sodium chloride 0.9 % 50 mL IVPB        1 g 100 mL/hr over 30 Minutes Intravenous  Once 05/22/12 9562 05/22/12 1308          Assessment/Plan: Problem List: Patient Active Problem List  Diagnosis  .  Hyponatremia  . Pancreatitis  . AKI (acute kidney injury), SCr improving  . HTN (hypertension)  . Thrombocytopenia  . Encounter for pre-operative cardiovascular clearance  . Diabetes mellitus  . Bruit, RCA  . Septicemia, organism pending  . Cirrhosis    Lipase is still elevated at 178.  Hungry and diet advanced to solid carb modified diet.   9 Days Post-Op    LOS: 10 days   Matt B. Daphine Deutscher, MD, [facility] [phone] beeper [phone]  05/31/2012 10:41 AM

## 2012-05-31 NOTE — Progress Notes (Signed)
[facility] Progress Note  05/31/2012  Subjective: Pt reports that he tolerated his carb modified diet.  His abdominal pain is better.    Objective:  Vital signs in last 24 hours: Filed Vitals:   05/30/12 0955 05/30/12 1340 05/30/12 2208 05/31/12 0632  BP: 141/69 112/70 144/78 127/70  Pulse: 70 81 68 68  Temp:  98 F (36.7 C) 98.5 F (36.9 C) 98.7 F (37.1 C)  TempSrc:  Oral Oral Oral  Resp:  16 18 16   Height:      Weight:      SpO2: 98% 97% 97% 97%   Weight change:   Intake/Output Summary (Last 24 hours) at 05/31/12 1357 Last data filed at 05/31/12 1300  Gross per 24 hour  Intake   1650 ml  Output      0 ml  Net   1650 ml   No results found for this basename: HGBA1C   Lab Results  Component Value Date   CREATININE 0.94 05/31/2012    Review of Systems As above, otherwise all reviewed and reported negative  Physical Exam General - awake, no distress, cooperative  HEENT - NCAT, MMM Lungs - BBS, CTA  CV - normal s1, s2 sounds  Abd - soft, nondistended, no masses, nontender  Ext - no C/C/E  Lab Results: Results for orders placed during the hospital encounter of 05/21/12 (from the past 24 hour(s))  GLUCOSE, CAPILLARY     Status: Abnormal   Collection Time   05/30/12  5:24 PM      Component Value Range   Glucose-Capillary 157 (*) 70 - 99 mg/dL  GLUCOSE, CAPILLARY     Status: Abnormal   Collection Time   05/30/12  9:14 PM      Component Value Range   Glucose-Capillary 103 (*) 70 - 99 mg/dL  COMPREHENSIVE METABOLIC PANEL     Status: Abnormal   Collection Time   05/31/12  4:46 AM      Component Value Range   Sodium 137  135 - 145 mEq/L   Potassium 3.5  3.5 - 5.1 mEq/L   Chloride 110  96 - 112 mEq/L   CO2 20  19 - 32 mEq/L   Glucose, Bld 114 (*) 70 - 99 mg/dL   BUN 9  6 - 23 mg/dL   Creatinine, Ser 7.82  0.50 - 1.35 mg/dL   Calcium 8.1 (*) 8.4 - 10.5 mg/dL   Total Protein 5.8 (*) 6.0 - 8.3 g/dL   Albumin 2.1 (*) 3.5 - 5.2 g/dL   AST 66 (*) 0 - 37 U/L   ALT 83 (*) 0 - 53 U/L   Alkaline Phosphatase 186 (*) 39 - 117 U/L   Total Bilirubin 1.1  0.3 - 1.2 mg/dL   GFR calc non Af Amer 81 (*) >90 mL/min   GFR calc Af Amer >90  >90 mL/min  LIPASE, BLOOD     Status: Abnormal   Collection Time   05/31/12  4:46 AM      Component Value Range   Lipase 178 (*) 11 - 59 U/L  GLUCOSE, CAPILLARY     Status: Abnormal   Collection Time   05/31/12 12:18 PM      Component Value Range   Glucose-Capillary 132 (*) 70 - 99 mg/dL   Comment 1 Documented in Chart     Comment 2 Notify RN      Micro Results: Recent Results (from the past 240 hour(s))  CULTURE, BLOOD (ROUTINE X 2)  Status: Normal   Collection Time   05/22/12 12:10 AM      Component Value Range Status Comment   Specimen Description BLOOD RIGHT ANTECUBITAL   Final    Special Requests BOTTLES DRAWN AEROBIC AND ANAEROBIC   Final    Culture  Setup Time 05/22/2012 09:10   Final    Culture     Final    Value: KLEBSIELLA PNEUMONIAE     Note: SUSCEPTIBILITIES PERFORMED ON PREVIOUS CULTURE WITHIN THE LAST 5 DAYS.     Note: Gram Stain Report Called to,Read Back By and Verified With: TRIPLIN S @ 2120 ON 05/22/12 BY GOLLD   Report Status 05/25/2012 FINAL   Final   CULTURE, BLOOD (ROUTINE X 2)     Status: Normal   Collection Time   05/22/12 12:15 AM      Component Value Range Status Comment   Specimen Description BLOOD LEFT ANTECUBITAL   Final    Special Requests BOTTLES DRAWN AEROBIC AND ANAEROBIC   Final    Culture  Setup Time 05/22/2012 09:10   Final    Culture     Final    Value: KLEBSIELLA PNEUMONIAE     Note: Gram Stain Report Called to,Read Back By and Verified With: TRIPLIN S @ 2120 ON 05/22/12 BY GOLLD   Report Status 05/25/2012 FINAL   Final    Organism ID, Bacteria KLEBSIELLA PNEUMONIAE   Final   URINE CULTURE     Status: Normal   Collection Time   05/22/12  1:31 AM      Component Value Range Status Comment   Specimen Description URINE, CLEAN CATCH   Final    Special Requests NONE    Final    Culture  Setup Time 05/22/2012 09:22   Final    Colony Count 15,000 COLONIES/ML   Final    Culture ENTEROCOCCUS SPECIES   Final    Report Status 05/25/2012 FINAL   Final    Organism ID, Bacteria ENTEROCOCCUS SPECIES   Final     Medications:  Scheduled Meds:   . amLODipine  5 mg Oral Daily  . ciprofloxacin  500 mg Oral BID  . hydrALAZINE  25 mg Oral Q8H  . insulin aspart  0-15 Units Subcutaneous TID WC  . insulin aspart  0-5 Units Subcutaneous QHS  . lisinopril  40 mg Oral Daily  . senna-docusate  1 tablet Oral BID  . sodium chloride  3 mL Intravenous Q12H   Continuous Infusions:   . sodium chloride 500 mL (05/31/12 0936)   PRN Meds:.morphine injection, ondansetron (ZOFRAN) IV, ondansetron  Assessment/Plan: 1. Gallstone Pancreatitis- s/p ERCP, advance diet per GI, feeling better, no nausea (chronic cholecystitis- ? gallbladder removal when Lipase and LFTs better), per surgery, awaiting CT results, following lipase levels, lipase slowly trending down, now 178   2. Gram negative rods in blood cultures most likely septic from CBD stone (klebsiella)- continue cipro PO x 14 days total  3. Nausea & vomiting- resolved   4. Diarrhea- resolved   5. Hyponatremia- resolved, IVF d/c'd   6. Pancreatitis- lipase > 3000 on admission, lipase decreased, abdominal pain better - advancing to carb mod diet today   7. AKI (acute kidney injury) - resolved- old labs point to CKD but Cr stable WNL now   8. DM- stable on supplemental insulin, hold metformin and glipizide   9. Fever- abx , seems to be resolving   10. Chills- abx , resolved   11. HTN (hypertension)-  controlled with lisinopril and amlodipine   12. Thrombocytopenia- resolved now. ? related to infection, monitor- d/c heparin and give SCDs   13. Falls- consulted PT for eval- recommended home health   14. HCV Cirrhosis/ liver spots on CT scan- High risk for surgery per surgery team, alpha feta protein WNL, MRI of the  abdomen without contrast- suggest cirrhosis per Dr. Elnoria Howard: suggestion of cirrhosis on the noncontrast scan with the possibility of hepatomas.  HBV neg, HCV positive. Follow up with Dr. Elnoria Howard outpatient once discharged and acute issues stable.   Code Status: full  Family Communication: family at bedside updated by surgeon  Disposition Plan: home possibly with home health at D/C - lives with wife and 2 kids   LOS: 10 days   Lequisha Cammack 05/31/2012, 1:57 PM  Cleora Fleet, MD, CDE, FAAFP [facility] [place], [place]  [phone]

## 2012-06-01 DIAGNOSIS — I5189 Other ill-defined heart diseases: Secondary | ICD-10-CM | POA: Diagnosis present

## 2012-06-01 DIAGNOSIS — R0989 Other specified symptoms and signs involving the circulatory and respiratory systems: Secondary | ICD-10-CM

## 2012-06-01 DIAGNOSIS — C220 Liver cell carcinoma: Secondary | ICD-10-CM | POA: Diagnosis present

## 2012-06-01 DIAGNOSIS — D649 Anemia, unspecified: Secondary | ICD-10-CM

## 2012-06-01 DIAGNOSIS — C228 Malignant neoplasm of liver, primary, unspecified as to type: Secondary | ICD-10-CM

## 2012-06-01 LAB — GLUCOSE, CAPILLARY: Glucose-Capillary: 133 mg/dL — ABNORMAL HIGH (ref 70–99)

## 2012-06-01 LAB — LIPASE, BLOOD: Lipase: 169 U/L — ABNORMAL HIGH (ref 11–59)

## 2012-06-01 MED ORDER — LISINOPRIL 40 MG PO TABS: 40.0000 mg | ORAL_TABLET | Freq: Every day | ORAL | Status: DC

## 2012-06-01 MED ORDER — AMLODIPINE BESYLATE 5 MG PO TABS: 5.0000 mg | ORAL_TABLET | Freq: Every day | ORAL | Status: DC

## 2012-06-01 MED ORDER — METFORMIN HCL ER 500 MG PO TB24: 500.0000 mg | ORAL_TABLET | Freq: Every day | ORAL | Status: DC

## 2012-06-01 MED ORDER — CIPROFLOXACIN HCL 500 MG PO TABS: 500.0000 mg | ORAL_TABLET | Freq: Two times a day (BID) | ORAL | Status: AC

## 2012-06-01 MED ORDER — HYDRALAZINE HCL 25 MG PO TABS: 25.0000 mg | ORAL_TABLET | Freq: Three times a day (TID) | ORAL | Status: DC

## 2012-06-01 NOTE — Discharge Summary (Signed)
Physician Discharge Summary  Patient ID: Christian Wallace MRN: [id number] DOB/AGE: January 13, 1941 71 y.o.  Admit date: 05/21/2012 Discharge date: 06/01/2012  Discharge Diagnoses:  Principal Problem:  *Encounter for pre-operative cardiovascular clearance  Pancreatitis  AKI (acute kidney injury), SCr improving  HTN (hypertension)  Diabetes mellitus  Bruit, RCA  Septicemia, organism pending  Cirrhosis  Hepatoma  Anemia  Diastolic Heart Dysfunction stage 1  Discharged Condition: good  Hospital Course: From H&P:  Christian Wallace is a 71 y.o. [place] male brought in by family who helped with interpreting. He started with nausea, vomiting and diarrhea 2 days ago. Those symptoms resolved yesterday but he continues to have epigastric and suprapubic pain, moderate in severity. His specific reason for being here this morning is that he has been having chills which he describes as "shaking in his bones". He is also having burning with urination and complaining of dark urine. His abdominal pain is worse with palpation or movement. He was noted to be febrile here with a temperature of 102.4. He denies chest pain, dyspnea or nausea at the present time. Initially patient was hypotensive in the ER but BP has improved with IVF and antibiotics.   1. Gallstone Pancreatitis- s/p ERCP, advance diet per GI, feeling better, no nausea (chronic cholecystitis- high risk for cholecystectomy, when Lipase and LFTs better pt was cleared to go home per surgery; they monitored patient and slowly advanced diet, lipase slowly trended down, now 169 at discharge.    2. Gram negative rods in blood cultures most likely septic from CBD stone (klebsiella)- continue cipro PO x 14 days total   3. Nausea & vomiting- resolved   4. Diarrhea- resolved   5. Hyponatremia- resolved, IVF d/c'd   6. Pancreatitis- lipase > 3000 on admission, lipase decreased to 169 at discharge, abdominal pain better - advanced to carb mod diet and tolerated   7. AKI  (acute kidney injury) - resolved- old labs point to CKD but Cr stable WNL now   8. DM- stable on supplemental insulin, resume metformin at discharge, follow BS, follow up with PCP  9. Fever- resolved   10. Chills- resolved   11. HTN (hypertension)- controlled with lisinopril and amlodipine and hydralazine  12. Thrombocytopenia- resolved now.   13. Falls- consulted PT for eval- pt will go home with rolling walker  14. HCV Cirrhosis/ liver spots on CT scan- High risk for surgery per surgery team, alpha feta protein WNL, MRI of the abdomen without contrast- suggest cirrhosis per Dr. Elnoria Howard: suggestion of cirrhosis on the noncontrast scan with the possibility of hepatomas. HBV neg, HCV positive. Follow up with Dr. Elnoria Howard outpatient once discharged and acute issues stable.   Consults: GI (HUNG), Surgery ([facility])  Significant Diagnostic Studies:  Left ventricle: The cavity size was normal. There was severe concentric hypertrophy - wall thickness measures 1.7-1.9 cm. Systolic function was normal. The estimated ejection fraction was in the range of 55% to 60%. Wall motion was normal; there were no regional wall motion abnormalities. Doppler parameters are consistent with abnormal left ventricular relaxation (grade 1 diastolic dysfunction). The E/e' ratio is >10, suggesting elevated LV filling pressure.  MRI 1. Nodular liver suggest cirrhosis. No focal lesions to suggest  hepatoma or metastasis.  2. Gallstones with thickened gallbladder wall suggest chronic  cholecystitis.  3. Small of fluid along the tail the pancreas and mild  peripancreatic edema consistent with mild pancreatitis. No  organized fluid collections.  4. Common bile duct appears normal without evidence of filling  defect following ERCP and stone extraction.  Original Report Authenticated By: Genevive Bi, M.D.  CT SCAN.  Respiratory motion degrades image quality. No definite  evidence of residual acute  pancreatitis.  2. Small bilateral pleural effusions with mild bilateral lower  lobe air space disease, right greater than left.  3. Extensive coronary artery calcification.  4. Cirrhosis.  5. Cholelithiasis with wall thickening, which can be seen with  chronic cholecystitis.  Discharge Exam:Pt without complaints, in no distress, tolerating carb mod diet, anxious to go home, spoke with grandaughter (translated) and updated on discharge and follow up recommendations.  They verbalized understanding.  Blood pressure 164/77, pulse 69, temperature 98 F (36.7 C), temperature source Oral, resp. rate 20, height 5\' 6"  (1.676 m), weight 54.432 kg (120 lb), SpO2 98.00%. General - awake, no distress, cooperative  HEENT - NCAT, MMM Lungs - BBS, CTA  CV - normal s1, s2 sounds  Abd - soft, nondistended, no masses, nontender  Ext - no C/C/E  Disposition: Home with family  Discharge Orders    Future Orders Please Complete By Expires   Diet - low sodium heart healthy      Increase activity slowly      Discharge instructions      Comments:   See Discharge AVS Form     Medication List  As of 06/01/2012 11:21 AM   TAKE these medications         amLODipine 5 MG tablet   Commonly known as: NORVASC   Take 1 tablet (5 mg total) by mouth daily.      ciprofloxacin 500 MG tablet   Commonly known as: CIPRO   Take 1 tablet (500 mg total) by mouth 2 (two) times daily.      hydrALAZINE 25 MG tablet   Commonly known as: APRESOLINE   Take 1 tablet (25 mg total) by mouth every 8 (eight) hours.      lisinopril 40 MG tablet   Commonly known as: PRINIVIL,ZESTRIL   Take 1 tablet (40 mg total) by mouth daily.      metFORMIN 500 MG 24 hr tablet   Commonly known as: GLUCOPHAGE-XR   Take 1 tablet (500 mg total) by mouth daily with breakfast.           Follow-up Information    Follow up with HUNG,PATRICK D, MD. Schedule an appointment as soon as possible for a visit in 2 weeks. [facility] follow up)     Contact information:   [address], [address] [place] [address] [phone]       Follow up with Adolph Pollack, MD. Schedule an appointment as soon as possible for a visit in 2 weeks. [facility] Follow Up)    Contact information:   [facility] [address] [place] [address] [phone]       Follow up with Establish Care with Primary Care Physician. Schedule an appointment as soon as possible for a visit in 1 week. (Follow Up Hospital )  Pt can go to [facility] clinic as walk in or [place] [facility] as walk-in to be seen as a walk-in patient.          I spent 50 mins preparing the discharge, reviewing long hospitalization records, notes, consultations, reconciling meds, writing prescriptions, etc.   Signed: Standley Dakins MD 06/01/2012, 11:21 AM Pager [phone]

## 2012-06-01 NOTE — Progress Notes (Signed)
Patient ID: Christian Wallace, male   DOB: [birth year], 71 y.o.   MRN: [id number] [facility] Surgery Progress Note:   10 Days Post-Op  Subjective: Mental status is apparently clear.  Spoke with patient through family interpreter Objective: Vital signs in last 24 hours: Temp:  [98 F (36.7 C)-98.3 F (36.8 C)] 98 F (36.7 C) (07/07 0444) Pulse Rate:  [69-82] 69  (07/07 0444) Resp:  [16-22] 20  (07/07 0444) BP: (114-164)/(74-82) 164/77 mmHg (07/07 0444) SpO2:  [96 %-98 %] 98 % (07/07 0444)  Intake/Output from previous day: 07/06 0701 - 07/07 0700 In: 1748.3 [P.O.:600; I.V.:1148.3] Out: 400 [Urine:400] Intake/Output this shift:    Physical Exam: Work of breathing is  Normal.  No new pain  Lab Results:  Results for orders placed during the hospital encounter of 05/21/12 (from the past 48 hour(s))  GLUCOSE, CAPILLARY     Status: Normal   Collection Time   05/30/12 11:46 AM      Component Value Range Comment   Glucose-Capillary 75  70 - 99 mg/dL   GLUCOSE, CAPILLARY     Status: Abnormal   Collection Time   05/30/12  5:24 PM      Component Value Range Comment   Glucose-Capillary 157 (*) 70 - 99 mg/dL   GLUCOSE, CAPILLARY     Status: Abnormal   Collection Time   05/30/12  9:14 PM      Component Value Range Comment   Glucose-Capillary 103 (*) 70 - 99 mg/dL   COMPREHENSIVE METABOLIC PANEL     Status: Abnormal   Collection Time   05/31/12  4:46 AM      Component Value Range Comment   Sodium 137  135 - 145 mEq/L    Potassium 3.5  3.5 - 5.1 mEq/L    Chloride 110  96 - 112 mEq/L    CO2 20  19 - 32 mEq/L    Glucose, Bld 114 (*) 70 - 99 mg/dL    BUN 9  6 - 23 mg/dL    Creatinine, Ser 4.09  0.50 - 1.35 mg/dL    Calcium 8.1 (*) 8.4 - 10.5 mg/dL    Total Protein 5.8 (*) 6.0 - 8.3 g/dL    Albumin 2.1 (*) 3.5 - 5.2 g/dL    AST 66 (*) 0 - 37 U/L    ALT 83 (*) 0 - 53 U/L    Alkaline Phosphatase 186 (*) 39 - 117 U/L    Total Bilirubin 1.1  0.3 - 1.2 mg/dL    GFR calc non Af Amer 81 (*) >90 mL/min      GFR calc Af Amer >90  >90 mL/min   LIPASE, BLOOD     Status: Abnormal   Collection Time   05/31/12  4:46 AM      Component Value Range Comment   Lipase 178 (*) 11 - 59 U/L   GLUCOSE, CAPILLARY     Status: Abnormal   Collection Time   05/31/12 12:18 PM      Component Value Range Comment   Glucose-Capillary 132 (*) 70 - 99 mg/dL    Comment 1 Documented in Chart      Comment 2 Notify RN     GLUCOSE, CAPILLARY     Status: Abnormal   Collection Time   05/31/12  4:33 PM      Component Value Range Comment   Glucose-Capillary 204 (*) 70 - 99 mg/dL   GLUCOSE, CAPILLARY     Status: Abnormal  Collection Time   05/31/12  9:15 PM      Component Value Range Comment   Glucose-Capillary 147 (*) 70 - 99 mg/dL    Comment 1 Notify RN     LIPASE, BLOOD     Status: Abnormal   Collection Time   06/01/12  4:37 AM      Component Value Range Comment   Lipase 169 (*) 11 - 59 U/L   GLUCOSE, CAPILLARY     Status: Abnormal   Collection Time   06/01/12  8:09 AM      Component Value Range Comment   Glucose-Capillary 133 (*) 70 - 99 mg/dL     Radiology/Results: No results found.  Anti-infectives: Anti-infectives     Start     Dose/Rate Route Frequency Ordered Stop   05/24/12 1015   ciprofloxacin (CIPRO) tablet 500 mg        500 mg Oral 2 times daily 05/24/12 0923     05/23/12 0600   ertapenem (INVANZ) 1 g in sodium chloride 0.9 % 50 mL IVPB  Status:  Discontinued        1 g 100 mL/hr over 30 Minutes Intravenous Every 24 hours 05/22/12 1215 05/25/12 0816   05/22/12 1500   ampicillin-sulbactam (UNASYN) 1.5 g in sodium chloride 0.9 % 50 mL IVPB        1.5 g 100 mL/hr over 30 Minutes Intravenous  Once 05/22/12 1457 05/22/12 1527   05/22/12 0615   ertapenem (INVANZ) 1 g in sodium chloride 0.9 % 50 mL IVPB        1 g 100 mL/hr over 30 Minutes Intravenous  Once 05/22/12 8119 05/22/12 1478          Assessment/Plan: Problem List: Patient Active Problem List  Diagnosis  . Hyponatremia  .  Pancreatitis  . AKI (acute kidney injury), SCr improving  . HTN (hypertension)  . Thrombocytopenia  . Encounter for pre-operative cardiovascular clearance  . Diabetes mellitus  . Bruit, RCA  . Septicemia, organism pending  . Cirrhosis    Tolerating diet.  Lipase trending slowly downward.  Would be OK to discharge today for followup by Dr. Abbey Chatters in [facility] office in 2 weeks. 10 Days Post-Op    LOS: 11 days   Matt B. Daphine Deutscher, MD, [facility] [phone] beeper [phone]  06/01/2012 8:34 AM

## 2012-06-01 NOTE — Progress Notes (Signed)
Discharged to family auto via car. Assessment unchanged from AM

## 2012-07-22 ENCOUNTER — Encounter (INDEPENDENT_AMBULATORY_CARE_PROVIDER_SITE_OTHER): Payer: Self-pay | Admitting: General Surgery

## 2012-07-22 ENCOUNTER — Ambulatory Visit (INDEPENDENT_AMBULATORY_CARE_PROVIDER_SITE_OTHER): Payer: Medicare Other | Admitting: General Surgery

## 2013-01-12 ENCOUNTER — Encounter (INDEPENDENT_AMBULATORY_CARE_PROVIDER_SITE_OTHER): Payer: Self-pay

## 2013-05-17 ENCOUNTER — Emergency Department (HOSPITAL_COMMUNITY): Payer: No Typology Code available for payment source

## 2013-05-17 ENCOUNTER — Emergency Department (HOSPITAL_COMMUNITY)
Admission: EM | Admit: 2013-05-17 | Discharge: 2013-05-18 | Disposition: A | Discharge status: Home / Self Care | Payer: No Typology Code available for payment source | Attending: Emergency Medicine | Admitting: Emergency Medicine

## 2013-05-17 DIAGNOSIS — Y9389 Activity, other specified: Secondary | ICD-10-CM | POA: Insufficient documentation

## 2013-05-17 DIAGNOSIS — IMO0002 Reserved for concepts with insufficient information to code with codable children: Secondary | ICD-10-CM | POA: Insufficient documentation

## 2013-05-17 DIAGNOSIS — S50312A Abrasion of left elbow, initial encounter: Secondary | ICD-10-CM

## 2013-05-17 DIAGNOSIS — R7309 Other abnormal glucose: Secondary | ICD-10-CM | POA: Insufficient documentation

## 2013-05-17 DIAGNOSIS — Y92410 Unspecified street and highway as the place of occurrence of the external cause: Secondary | ICD-10-CM | POA: Insufficient documentation

## 2013-05-17 DIAGNOSIS — R739 Hyperglycemia, unspecified: Secondary | ICD-10-CM

## 2013-05-17 LAB — BASIC METABOLIC PANEL
BUN: 22 mg/dL (ref 6–23)
Creatinine, Ser: 1.13 mg/dL (ref 0.50–1.35)
GFR calc Af Amer: 72 mL/min — ABNORMAL LOW (ref >90)
GFR calc non Af Amer: 62 mL/min — ABNORMAL LOW (ref >90)
Potassium: 3.7 mEq/L (ref 3.5–5.1)

## 2013-05-17 LAB — CBC WITH DIFFERENTIAL/PLATELET
Basophils Absolute: 0 10*3/uL (ref 0.0–0.1)
Basophils Relative: 1 % (ref 0–1)
MCHC: 35.5 g/dL (ref 30.0–36.0)
Neutro Abs: 2.6 10*3/uL (ref 1.7–7.7)
Neutrophils Relative %: 50 % (ref 43–77)
RDW: 13.1 % (ref 11.5–15.5)
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
## 2013-05-18 MED ORDER — HYDROCODONE-ACETAMINOPHEN 5-325 MG PO TABS: 1.0000 | ORAL_TABLET | Freq: Four times a day (QID) | ORAL | Status: DC | PRN
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
## 2013-11-27 ENCOUNTER — Emergency Department (HOSPITAL_COMMUNITY): Payer: Medicare Other

## 2013-11-27 ENCOUNTER — Emergency Department (HOSPITAL_COMMUNITY)
Admission: EM | Admit: 2013-11-27 | Discharge: 2013-11-27 | Disposition: A | Discharge status: Home / Self Care | Payer: Medicare Other | Attending: Emergency Medicine | Admitting: Emergency Medicine

## 2013-11-27 ENCOUNTER — Encounter (HOSPITAL_COMMUNITY): Payer: Self-pay | Admitting: Emergency Medicine

## 2013-11-27 DIAGNOSIS — R05 Cough: Secondary | ICD-10-CM

## 2013-11-27 DIAGNOSIS — E785 Hyperlipidemia, unspecified: Secondary | ICD-10-CM | POA: Insufficient documentation

## 2013-11-27 DIAGNOSIS — Z87891 Personal history of nicotine dependence: Secondary | ICD-10-CM | POA: Insufficient documentation

## 2013-11-27 DIAGNOSIS — B349 Viral infection, unspecified: Secondary | ICD-10-CM

## 2013-11-27 DIAGNOSIS — I10 Essential (primary) hypertension: Secondary | ICD-10-CM | POA: Insufficient documentation

## 2013-11-27 DIAGNOSIS — R059 Cough, unspecified: Secondary | ICD-10-CM

## 2013-11-27 DIAGNOSIS — Z8669 Personal history of other diseases of the nervous system and sense organs: Secondary | ICD-10-CM | POA: Insufficient documentation

## 2013-11-27 DIAGNOSIS — B9789 Other viral agents as the cause of diseases classified elsewhere: Secondary | ICD-10-CM | POA: Insufficient documentation

## 2013-11-27 DIAGNOSIS — E119 Type 2 diabetes mellitus without complications: Secondary | ICD-10-CM | POA: Insufficient documentation

## 2013-11-27 LAB — CBC WITH DIFFERENTIAL/PLATELET
BASOS PCT: 0 % (ref 0–1)
Basophils Absolute: 0 10*3/uL (ref 0.0–0.1)
EOS ABS: 0.2 10*3/uL (ref 0.0–0.7)
Eosinophils Relative: 2 % (ref 0–5)
HCT: 34.6 % — ABNORMAL LOW (ref 39.0–52.0)
HEMOGLOBIN: 12.2 g/dL — AB (ref 13.0–17.0)
Lymphocytes Relative: 27 % (ref 12–46)
Lymphs Abs: 1.9 10*3/uL (ref 0.7–4.0)
MCH: 30.3 pg (ref 26.0–34.0)
MCHC: 35.3 g/dL (ref 30.0–36.0)
MCV: 86.1 fL (ref 78.0–100.0)
MONOS PCT: 11 % (ref 3–12)
Monocytes Absolute: 0.8 10*3/uL (ref 0.1–1.0)
NEUTROS ABS: 4.4 10*3/uL (ref 1.7–7.7)
Neutrophils Relative %: 60 % (ref 43–77)
PLATELETS: 164 10*3/uL (ref 150–400)
RBC: 4.02 MIL/uL — ABNORMAL LOW (ref 4.22–5.81)
RDW: 12.3 % (ref 11.5–15.5)
WBC: 7.3 10*3/uL (ref 4.0–10.5)

## 2013-11-27 LAB — BASIC METABOLIC PANEL
BUN: 27 mg/dL — AB (ref 6–23)
CALCIUM: 8.8 mg/dL (ref 8.4–10.5)
CO2: 26 mEq/L (ref 19–32)
CREATININE: 1.31 mg/dL (ref 0.50–1.35)
Chloride: 98 mEq/L (ref 96–112)
GFR, EST AFRICAN AMERICAN: 60 mL/min — AB (ref >90)
GFR, EST NON AFRICAN AMERICAN: 52 mL/min — AB (ref >90)
GLUCOSE: 226 mg/dL — AB (ref 70–99)
POTASSIUM: 4.4 meq/L (ref 3.7–5.3)
Sodium: 135 mEq/L — ABNORMAL LOW (ref 137–147)

## 2013-11-27 LAB — POCT I-STAT TROPONIN I: TROPONIN I, POC: 0.03 ng/mL (ref 0.00–0.08)

## 2013-11-27 LAB — PRO B NATRIURETIC PEPTIDE: Pro B Natriuretic peptide (BNP): 1766 pg/mL — ABNORMAL HIGH (ref 0–125)

## 2013-11-27 MED ORDER — BENZONATATE 100 MG PO CAPS: 100.0000 mg | ORAL_CAPSULE | Freq: Three times a day (TID) | ORAL | Status: DC

## 2013-11-27 MED ORDER — HYDROCODONE-HOMATROPINE 5-1.5 MG/5ML PO SYRP: 5.0000 mL | ORAL_SOLUTION | Freq: Four times a day (QID) | ORAL | Status: DC | PRN

## 2013-11-27 MED ORDER — BENZONATATE 100 MG PO CAPS
100.0000 mg | ORAL_CAPSULE | Freq: Once | ORAL | Status: AC
  Administered 2013-11-27: 100 mg via ORAL
  Filled 2013-11-27: qty 1

## 2013-11-27 NOTE — ED Notes (Signed)
Pt c/o productive cough x 2 weeks worse at night when laying flat; pt sts white sputum and denies fever

## 2013-11-27 NOTE — ED Notes (Signed)
PA reporting patient can eat. Is eating muffin and water.

## 2013-11-27 NOTE — ED Notes (Signed)
Pt's granddaughter assisting to translate.

## 2013-11-27 NOTE — ED Notes (Signed)
Patient transported to X-ray 

## 2013-11-27 NOTE — Discharge Instructions (Signed)
You have been evaluated for your cough.  It is likely due to viral infection.  Take cough medications as prescribed.  Your CT scan shows evidence of possible inflammed gallbladder.  If you develop pain to your right upper abdomen with nausea and vomiting then return to ER for further care.  You will also need a repeat chest CT scan in 6-12 months to ensure no significant changes concerning for cancer.  Return to ER sooner if you develop chest pain.  Vim ph? qu?n (Bronchitis) Vim ph? qu?n l cch c? th? ph?n ?ng l?i t?n th??ng v/ho?c nhi?m trng (vim) c?a ph? qu?n. Ph? qu?n l ?ng d?n kh ko di t? kh qu?n ??n ph?i. Khi vim nhi?m n?ng c th? gy kh th?.  NGUYN NHN Vim nhi?m c th? gy ra b?i:  Vi rt.  Vi trng (vi khu?n).  B?i.  Ch?t gy d? ?ng.  Ch?t b?n v nhi?u ch?t kch thch khc. Cc t? bo lt cy ph? qu? ???c bao b?c b?i cc lng t? r?t nh? (lng mao). Nh?ng lng t? ny lin t?c ??p ln pha trn v ra kh?i ph?i, v? pha mi?ng. Nh? ? ng?n khng cho cc ch?t b?n ?i [place] ph?i. Khi nh?ng t? bo ny tr? nn qu kch thch v khng th? th?c hi?n ch?c n?ng c?a chng, d?ch nh?y s? b?t ??u pht tri?n. ?i?u ny gy ra tri?u ch?ng ho ??c tr?ng c?a vim ph? qu?n. Ho s? lm s?ch ph?i khi cc lng mao ny khng th? th?c hi?n ch?c n?ng c?a chng. Khi khng c c? hai c? ch? b?o v? ny, d?ch nh?y s? ??ng l?i trong ph?i. Khi ? b?n s? b? vim ph?i. Ht thu?c l l nguyn nhn ph? bi?n c?a vim ph? qu?n v c th? gp ph?n gy vim ph?i. Vi?c d?ng thi quen ny l ?i?u quan tr?ng duy nh?t m b?n c th? lm ?? t? gip mnh. ?I?U TR?  Chuyn gia ch?m Keller s?c kh?e c th? k ??n thu?c khng sinh n?u b?n b? ho do vi khu?n. Ngoi ra, cc lo?i thu?c gip lm khai thng ???ng th? ?? b?n th? d? h?n. Chuyn gia ch?m McGrew s?c kh?e c?ng c th? khuy?n ngh? ho?c k ??n thu?c long ??m. Thu?c long ??m s? lm l?ng d?ch nh?y ?? c ho ra. Ch? s? d?ng thu?c khng c?n k toa ho?c thu?c c?n k toa ?? gi?m ?au, gi?m  c?m gic kh ch?u ho?c h? s?t theo ch? d?n c?a chuyn gia ch?m [place] s?c kh?e c?a b?n.  Vi?c lo?i b? b?t c? ?i?u g gy ra v?n ?? ? (v d? nh? ht thu?c l) c  ngh?a quan tr?ng ??i v?i vi?c ng?n khng lm cho v?n ?? t?i t? h?n.  Thu?c gi?m ho c th? ???c k ??n ?? lm gi?m tri?u ch?ng ho.  Thu?c d?ng ht c th? ???c k ??n ?? gip ?i?u tr? cc tri?u ch?ng hi?n t?i v ?? gip ng?n ng?a v?n ?? ti pht.  V?i nh?ng ng??i b? vim ph? qu?n ti pht(m?n tnh) c th? c?n ph?i dng thu?c steroid. HY NGAY L?P T?C ?I KHM N?U:  Trong qu trnh ?i?u tr? b?n c ??m gi?ng m? nhi?u h?n (??m c m?).  B?n b? s?t.  Tnh tr?ng b?nh c?a b?n tr? nn n?ng h?n.  B?n th?y kh th? h?n, th? kh kh hay kh th?. C?n ?i khm ngay l?p t?c n?u b?n l ng??i l?n tu?i ho?c ?ang b? b?t k? b?nh no khc. ??  M B?O B?N:  Hi?u cc h??ng d?n ny.  S? theo di tnh tr?ng c?a mnh.  S? yu c?u tr? gip ngay l?p t?c n?u b?n c?m th?y khng ?? ho?c tnh tr?ng tr?m tr?ng h?n. Document Released: 11/12/2005 Document Revised: 07/15/2013 [facility] Patient Information 2014 [place], [place].   Emergency Department Resource Guide 1) Find a Doctor and Pay Out of Pocket Although you won't have to find out who is covered by your insurance plan, it is a good idea to ask around and get recommendations. You will then need to call the office and see if the doctor you have chosen will accept you as a new patient and what types of options they offer for patients who are self-pay. Some doctors offer discounts or will set up payment plans for their patients who do not have insurance, but you will need to ask Clary you aren't surprised when you get to your appointment.  2) Contact Your Local Health Department Not all health departments have doctors that can see patients for sick visits, but many do, Kaylob it is worth a call to see if yours does. If you don't know where your local health department is, you can check in your phone book. The CDC also  has a tool to help you locate your state's health department, and many state websites also have listings of all of their local health departments.  3) Find a [facility] If your illness is not likely to be very severe or complicated, you may want to try a walk in clinic. These are popping up all over the country in pharmacies, drugstores, and shopping centers. They're usually staffed by nurse practitioners or physician assistants that have been trained to treat common illnesses and complaints. They're usually fairly quick and inexpensive. However, if you have serious medical issues or chronic medical problems, these are probably not your best option.  No Primary Care Doctor: - Call Health Connect at  [phone] - they can help you locate a primary care doctor that  accepts your insurance, provides certain services, etc. - Physician Referral Service- [phone]  Chronic Pain Problems: Organization         Address  Phone   Notes  [facility]  [phone] Patients need to be referred by their primary care doctor.   Medication Assistance: Organization         Address  Phone   Notes  [facility] Medication [facility] [address] [place], [place] [address] [phone] --Must be a resident of [facility] -- Must have NO insurance coverage whatsoever (no Medicaid/ Medicare, etc.) -- The pt. MUST have a primary care doctor that directs their care regularly and follows them in the community   MedAssist  [phone]   [employer]  [phone]    Agencies that provide inexpensive medical care: Organization         Address  Phone   Notes  [place]  [phone]   [facility]    [phone]   [facility] Iv Stallings, [place] [address] [phone]   [place] [address], [place] [phone]   Planned Parenthood    [phone]    [facility]    [phone]   [address], [place] Phone:  [phone], Fax:  [phone] Hours of Operation:  9 am - 6 pm, M-F.  Also accepts Medicaid/Medicare and self-pay.  [facility]  [address], [place] Phone: [phone]) [phone], Fax: [phone]. Hours of Operation:  8:30 am - 5:30 pm, M-F.  Also accepts Medicaid and self-pay.  [facility] [place] [address], [place] Phone: [phone](336) [phone]   [facility] [address], [place], [place] [phone], Ext. [phone] Mondays & Thursdays: 7-9 AM.  First 15 patients are seen on a first come, first serve basis.    Medicaid-accepting [facility] Providers:  Organization         Address  Phone   Notes  [facility] [address], [place] [phone] Also accepts self-pay patients.  [facility] [address], [place]  [phone]   [facility] [address], [place] [phone]   [facility] [address] [place] [address], [place] [phone]) [phone]   Renaye RakersVeita Bland [address], [place]   [phone] Only accepts [place] Access [place] patients after they have their name applied to their card.   Self-Pay (no insurance) in [facility]:  Organization         Address  Phone   Notes  Sickle Cell Patients, [facility] [address], [place] [phone]) [phone]   [facility] [address], [place] [phone]   [facility] [place]  [address], [place] [phone]   [facility]/Dr. Osei-Bonsu  [address] [place] [address], [place] or [phone], [place] [phone]) [phone] Phone number for both [place] and [place] locations is the same.  [facility] [address], [place] [phone](336) [phone]   [facility] [place] [address] [place] [address], [place] or [address] [phone]) [phone]   [facility] [address], [place] [phone]) [phone], phone; [phone]) [phone], fax Sees patients 1st and 3rd Saturday of every month.  Must not qualify for public or private insurance (i.e. Medicaid, Medicare, [place] Health Choice, Veterans' Benefits)  Household income should be no more than 200% of the poverty level The clinic cannot treat you if you are pregnant or think you are pregnant  Sexually transmitted diseases are not treated at the clinic.    Dental Care: Organization         Address  Phone  Notes  [facility] [address], [place] [phone] Accepts children up to age 75 who are enrolled in [place] or [place] Health Choice; pregnant women with a Medicaid card; and children who have applied for Medicaid or [place] Health Choice, but were declined, whose parents can pay a reduced fee at time of service.  [facility] of [facility] [place]  [address], [place] [phone] Accepts children up to age 75 who are enrolled in [place] or [place] Health Choice; pregnant women with a Medicaid card; and children who have applied for Medicaid or [place] Health Choice, but were declined, whose parents can pay a reduced fee at time of service.  [facility]  [address], [place] [phone] Patients are seen by appointment only. Walk-ins are not accepted. [facility] will see patients 18 years of  age and older. Monday - Tuesday (8am-5pm) Most Wednesdays (8:30-5pm) $30 per visit, cash only  [facility] Adult Dental Access PROGRAM  [address], [facility] [phone] Patients are seen by appointment only. Walk-ins are not accepted. [facility] will see patients 89 years of age and older. One Wednesday  Evening (Monthly: Volunteer Based).  $30 per visit, cash only  [employer]  [phone] for adults; Children under age 67, call Graduate Pediatric Dentistry at [phone]. Children aged 47-14, please call [phone] to request a pediatric application.  Dental services are provided in all areas of dental care including fillings, crowns and bridges, complete and partial dentures, implants, gum treatment, root canals, and extractions. Preventive care is also provided. Treatment is provided to both adults and children. Patients are selected via a lottery and there is often a waiting list.   [facility] [address], [place]  [phone] [email or web address]   [facility] [address] [place], [place] [phone], Ext. [phone] Second and Fourth Thursday of each month, opens at 6:30 AM; Clinic ends at 9 AM.  Patients are seen on a first-come first-served basis, and a limited number are seen during each clinic.   [facility]  [address] [place], [place] [phone]   Eligibility Requirements You must have lived in [place], [place], or [place] counties for at least the last three months.   You cannot be eligible for state or federal sponsored [place], including [employer], [place], or [employer].   You generally cannot be eligible for healthcare insurance through your employer.    How to apply: Eligibility screenings are held every Tuesday and Wednesday afternoon from 1:00 pm until 4:00 pm. You do not need an appointment for the interview!  [facility] [address] [place], [place] [phone]   [facility] Health Department  [phone]   [facility] Health Department  [phone]   [facility] Health Department  [phone]    Behavioral Health Resources in the Community: Intensive Outpatient Programs Organization         Address  Phone  Notes  [facility] [address] [place], [place] [phone]   [facility] Outpatient [address] [place], [place] [phone]   [facility] [address] [place], [place]  [phone]   [facility] [address],  [place], [place] [phone] or [phone]   Substance Abuse Resources Organization         Address  Phone  Notes  [facility] Drug Services  [phone]   [facility]  [phone]   [facility]  [phone]   Floydene Flock  [phone]   Residential & Outpatient Substance Abuse Program  [phone]   Psychological Services Organization         Address  Phone  Notes  [facility] Behavioral Health  [phone]   [facility] Services  [phone]   [facility] Mental Health [address] [place] [phone] or [phone]    Mobile Crisis Teams Organization         Address  Phone  Notes  [facility], Mobile Crisis Care Unit  [phone]   [facility]  [address] [place], [place] [phone]   [facility] [address] [place] [phone]    Self-Help/Support Groups Organization  Address  Phone             Notes  [facility] [place] - variety of support groups  [phone]- I7437963 Call for more information  Narcotics Anonymous (NA), [facility] [address], [employer] [place]  2 meetings at this location   Statistician         Address  Phone  Notes  [facility] [address],    [place]  [phone]   [facility]  [address], [place] [address], [place], [place] [phone]   [facility] [address] [place], [place] [phone] Admissions: 8am-3pm M-F  [facility] [address],    [place], [place] [phone]   [facility] [address],  [place], [place] [phone]   [facility] [address],  [place], [place] [phone]   [facility] - Intensive Outpatient [address], [place], [place] [phone]   [facility] ([facility] [address] [place]  [place], [place] [phone] or [phone]   [facility] ([facility]) [address] [place], [place] [phone] Accepts Medicaid  [facility] [place] [address],  [place] [phone] Substance Abuse/Addiction Treatment   [facility] Organization         Address  Phone  Notes  [facility]  [phone]   Angie Fava, PhD [address]vin Kn[address] [place], [place]   [phone] or [phone]   [facility]   [address] [place], [place] [phone]   [facility] [address] [place], [place] [phone] Insurance/Medicaid/sponsorship through [facility] [address]                                    [place], [place] [phone] Therapy/tele-psych/case  [facility] [address], [place] [phone]    Dr. Lolly Mustache  [phone]   [facility] [place]  [employer] [facility] Dept. 1) [address] [place] 2) [address] [place] 3)  [address], [place] [phone]  [phone]   [facility] Child Abuse Hotline 218-617-3177 or [phone] (After Hours)

## 2013-11-27 NOTE — ED Provider Notes (Signed)
CSN: [id number]     Arrival date & time 11/27/13  1059 History   First MD Initiated Contact with Patient 11/27/13 1118     Chief Complaint  Patient presents with  . Cough   (Consider location/radiation/quality/duration/timing/severity/associated sxs/prior Treatment) HPI  73 year old male with history of diabetes, hypertension, and neuromuscular disorder presents for evaluation of productive cough. Patient reports for the past 2 weeks he has had persistent cough cough is productive with white to brown sputum. Cough is worse at nighttime keeping him up at night. He has tried over-the-counter home remedies minimal improvement. His wife is sick with the same cough. Otherwise patient denies fever, chills, headache, neck pain, sore throat, sneezing, runny nose, nausea vomiting diarrhea, abdominal pain, back pain, numbness or weakness, or rash. He is a nonsmoker. He denies any calf pain or leg swelling. He does take lisinopril it has been taking that for a while. He denies any exertional chest pain but does report pleuritic pain with cough.  Past Medical History  Diagnosis Date  . Diabetes mellitus   . HTN (hypertension)   . HLD (hyperlipidemia)   . Anginal pain   . Dysphagia   . Neuromuscular disorder     muscle pain in arms   Past Surgical History  Procedure Laterality Date  . No past surgeries    . Ercp  05/22/2012    Procedure: ENDOSCOPIC RETROGRADE CHOLANGIOPANCREATOGRAPHY (ERCP);  Surgeon: Beryle Beams, MD;  Location: [facility];  Service: Endoscopy;  Laterality: N/A;   History reviewed. No pertinent family history. History  Substance Use Topics  . Smoking status: Former Smoker    Types: Cigarettes  . Smokeless tobacco: Never Used  . Alcohol Use: Yes     Comment: heavy ETOH consumption in past    Review of Systems  All other systems reviewed and are negative.    Allergies  Review of patient's allergies indicates no known allergies.  Home Medications   Current  Outpatient Rx  Name  Route  Sig  Dispense  Refill  . EXPIRED: amLODipine (NORVASC) 5 MG tablet   Oral   Take 1 tablet (5 mg total) by mouth daily.   30 tablet   0   . ciprofloxacin (CIPRO) 500 MG tablet               . glipiZIDE (GLUCOTROL) 10 MG tablet               . EXPIRED: hydrALAZINE (APRESOLINE) 25 MG tablet   Oral   Take 1 tablet (25 mg total) by mouth every 8 (eight) hours.   90 tablet   0   . HYDROcodone-acetaminophen (NORCO/VICODIN) 5-325 MG per tablet   Oral   Take 1 tablet by mouth every 6 (six) hours as needed for pain.   15 tablet   0   . EXPIRED: lisinopril (PRINIVIL,ZESTRIL) 40 MG tablet   Oral   Take 1 tablet (40 mg total) by mouth daily.   30 tablet   0   . lisinopril-hydrochlorothiazide (PRINZIDE,ZESTORETIC) 20-25 MG per tablet               . EXPIRED: metFORMIN (GLUCOPHAGE-XR) 500 MG 24 hr tablet   Oral   Take 1 tablet (500 mg total) by mouth daily with breakfast.   30 tablet   0   . simvastatin (ZOCOR) 40 MG tablet                BP 148/83  Pulse 68  Temp(Src) 97.8  F (36.6 C) (Oral)  Resp 16  Wt 118 lb 9 oz (53.78 kg)  SpO2 100% Physical Exam  Constitutional: He is oriented to person, place, and time. He appears well-developed and well-nourished. No distress.  HENT:  Head: Atraumatic.  Right Ear: External ear normal.  Left Ear: External ear normal.  Mouth with full dentures  Eyes: Conjunctivae are normal.  Neck: Normal range of motion. Neck supple. No JVD present.  Cardiovascular: Normal rate and regular rhythm.  Exam reveals no gallop and no friction rub.   No murmur heard. Pulmonary/Chest: Effort normal.  Crackles heard at the base of lungs.  Abdominal: Soft. There is no tenderness.  Musculoskeletal: He exhibits no edema and no tenderness.  Neurological: He is alert and oriented to person, place, and time.  Skin: No rash noted.  Psychiatric: He has a normal mood and affect.    ED Course  Procedures  (including critical care time)  Patient here with persistent cough for the past 2 weeks. Chest x-ray showed mild hyperinflation with left basilar scarring without active disease. Although patient is taking an ACE inhibitor, since wife has similar cough I do not suspect ACE inhibitor induced cough.  Since patient complaining of worsening cough at nighttime and increased shortness of breath, trouble with chest x-rays with a wet sounding lungs, along with an elevated pro BNP of 1766 with no prior baseline for comparison, will obtain chest CT as recommended by my attending Dr. for further evaluation. Suspect undiagnosed CHF.  2:44 PM ECG with diffused ST changes new from prior ECG, reviewed by Dr. Deretha Emory.  No active CP, trop I WNL.    3:08 PM Patient states cough medication has helped. Chest CT scan without definite acute finding account for patient's symptoms. There is no evidence of acute pneumonia or pulmonary edema. There is evidence of cholelithiasis and gallbladder wall thickening with potential for cholecystitis. However patient denies any abdominal pain and reexaminations of of patient's abdomen is without any focal point tenderness, negative Murphy's sign. There are also scattered tiny nodules in the right upper lobe. Patient however is no longer a smoker. I recommend follow up with PCP for repeat chest CT to rule out cancer within the next 6-12 month. He also has evidence of arthrosclerosis including left main and three-vessel coronary artery disease. He is currently without active chest pain. He will need to be managed further by his PCP as well. Resources provided. Return precautions discussed.  Labs Review Labs Reviewed  BASIC METABOLIC PANEL - Abnormal; Notable for the following:    Sodium 135 (*)    Glucose, Bld 226 (*)    BUN 27 (*)    GFR calc non Af Amer 52 (*)    GFR calc Af Amer 60 (*)    All other components within normal limits  CBC WITH DIFFERENTIAL - Abnormal; Notable for  the following:    RBC 4.02 (*)    Hemoglobin 12.2 (*)    HCT 34.6 (*)    All other components within normal limits  PRO B NATRIURETIC PEPTIDE - Abnormal; Notable for the following:    Pro B Natriuretic peptide (BNP) 1766.0 (*)    All other components within normal limits  POCT I-STAT TROPONIN I   Imaging Review No results found.  EKG Interpretation    Date/Time:  Friday November 27 2013 12:47:09 EST Ventricular Rate:  58 PR Interval:  198 QRS Duration: 88 QT Interval:  455 QTC Calculation: 447 R Axis:   -62  Text Interpretation:  Age not entered, assumed to be  73 years old for purpose of ECG interpretation Sinus rhythm Left anterior fascicular block Probable LVH with secondary repol abnrm Anterior Q waves, possibly due to LVH ST depr, consider ischemia, inferior leads Significant changes have occurred New changes anteriorly and ST depression inferiorly Confirmed by ZACKOWSKI  MD, SCOTT ([id number]) on 11/27/2013 12:51:54 PM            MDM   1. Viral infection   2. Cough     BP 151/84  Pulse 60  Temp(Src) 98.4 F (36.9 C) (Oral)  Resp 20  Wt 118 lb 9 oz (53.78 kg)  SpO2 99%  I have reviewed nursing notes and vital signs. I personally reviewed the imaging tests through PACS system  I reviewed available ER/hospitalization records thought the EMR     Domenic Moras, [place] 11/27/13 1518

## 2013-11-30 NOTE — ED Provider Notes (Signed)
Medical screening examination/treatment/procedure(s) were conducted as a shared visit with non-physician practitioner(s) and myself.  I personally evaluated the patient during the encounter.  EKG Interpretation    Date/Time:  Friday November 27 2013 12:47:09 EST Ventricular Rate:  58 PR Interval:  198 QRS Duration: 88 QT Interval:  455 QTC Calculation: 447 R Axis:   -62 Text Interpretation:  Age not entered, assumed to be  73 years old for purpose of ECG interpretation Sinus rhythm Left anterior fascicular block Probable LVH with secondary repol abnrm Anterior Q waves, possibly due to LVH ST depr, consider ischemia, inferior leads Significant changes have occurred New changes anteriorly and ST depression inferiorly Confirmed by Autym Siess  MD, Masayoshi Couzens ([id number]) on 11/27/2013 12:51:54 PM           Results for orders placed during the hospital encounter of 16/10/96  BASIC METABOLIC PANEL      Result Value Range   Sodium 135 (*) 137 - 147 mEq/L   Potassium 4.4  3.7 - 5.3 mEq/L   Chloride 98  96 - 112 mEq/L   CO2 26  19 - 32 mEq/L   Glucose, Bld 226 (*) 70 - 99 mg/dL   BUN 27 (*) 6 - 23 mg/dL   Creatinine, Ser 1.31  0.50 - 1.35 mg/dL   Calcium 8.8  8.4 - 10.5 mg/dL   GFR calc non Af Amer 52 (*) >90 mL/min   GFR calc Af Amer 60 (*) >90 mL/min  CBC WITH DIFFERENTIAL      Result Value Range   WBC 7.3  4.0 - 10.5 K/uL   RBC 4.02 (*) 4.22 - 5.81 MIL/uL   Hemoglobin 12.2 (*) 13.0 - 17.0 g/dL   HCT 34.6 (*) 39.0 - 52.0 %   MCV 86.1  78.0 - 100.0 fL   MCH 30.3  26.0 - 34.0 pg   MCHC 35.3  30.0 - 36.0 g/dL   RDW 12.3  11.5 - 15.5 %   Platelets 164  150 - 400 K/uL   Neutrophils Relative % 60  43 - 77 %   Neutro Abs 4.4  1.7 - 7.7 K/uL   Lymphocytes Relative 27  12 - 46 %   Lymphs Abs 1.9  0.7 - 4.0 K/uL   Monocytes Relative 11  3 - 12 %   Monocytes Absolute 0.8  0.1 - 1.0 K/uL   Eosinophils Relative 2  0 - 5 %   Eosinophils Absolute 0.2  0.0 - 0.7 K/uL   Basophils Relative 0  0 - 1 %   Basophils Absolute 0.0  0.0 - 0.1 K/uL  PRO B NATRIURETIC PEPTIDE      Result Value Range   Pro B Natriuretic peptide (BNP) 1766.0 (*) 0 - 125 pg/mL  POCT I-STAT TROPONIN I      Result Value Range   Troponin i, poc 0.03  0.00 - 0.08 ng/mL   Comment 3            Dg Chest 2 View  11/27/2013   CLINICAL DATA:  Cough.  EXAM: CHEST  2 VIEW  COMPARISON:  05/22/2012  FINDINGS: Linear densities in the left base are stable since prior study, compatible with scarring. Mild hyperinflation of the lungs. Heart is normal size. No effusions. No acute bony abnormality.  IMPRESSION: Mild hyperinflation.  Left basilar scarring.  No active disease.   Electronically Signed   By: Rolm Baptise M.D.   On: 11/27/2013 12:29   Ct Chest Wo Contrast  11/27/2013  CLINICAL DATA:  Productive cough for the past 2 weeks. Worst at night. White sputum production.  EXAM: CT CHEST WITHOUT CONTRAST  TECHNIQUE: Multidetector CT imaging of the chest was performed following the standard protocol without IV contrast.  COMPARISON:  No priors.  FINDINGS: Mediastinum: Heart size is normal. There is no significant pericardial fluid, thickening or pericardial calcification. There is atherosclerosis of the thoracic aorta, the great vessels of the mediastinum and the coronary arteries, including calcified atherosclerotic plaque in the left main, left anterior descending, left circumflex and right coronary arteries. No pathologically enlarged mediastinal or hilar lymph nodes. Please note that accurate exclusion of hilar adenopathy is limited on noncontrast CT scans. Esophagus is unremarkable in appearance.  Lungs/Pleura: Areas of architectural distortion and thickening of the peribronchovascular interstitium throughout the lower lobes of the lungs bilaterally (left greater than right), similar to prior CT of the abdomen and pelvis 05/22/2012, most compatible mild chronic post infectious or inflammatory scarring. No acute consolidative airspace disease.  No pleural effusions. No definite suspicious appearing pulmonary nodules or masses are identified. Scattered tiny 2-3 mm pulmonary nodules in the periphery of the lungs, favored to represent areas of mucoid impaction within terminal bronchioles. Tiny 6 mm right upper lobe nodule which may have a small amount of internal cavitation (image 16 of series 3).  Upper Abdomen: Multiple partially calcified gallstones in the gallbladder. Gallbladder wall appears markedly thickened with some surrounding inflammatory changes, concerning for potential acute cholecystitis. Gallbladder is only minimally distended at this time.  Musculoskeletal: There are no aggressive appearing lytic or blastic lesions noted in the visualized portions of the skeleton.  IMPRESSION: 1. No definite acute findings to account for the patient's symptoms. Specifically, no definite signs of active acute pneumonia. 2. Cholelithiasis. Gallbladder is largely decompressed, however, the gallbladder wall appears markedly thickened and edematous, and potentially inflamed. Clinical correlation for signs and symptoms of acute cholecystitis is recommended. Further evaluation with right upper quadrant abdominal ultrasound may provide additional diagnostic information if clinically indicated. 3. Extensive bibasilar scarring (left greater than right), similar to the prior study. 4. Scattered tiny nodules, largest of which measures 6 mm in the right upper lobe, which may have some internal cavitation. This is nonspecific. If the patient is at high risk for bronchogenic carcinoma, follow-up chest CT at 6-12 months is recommended. If the patient is at low risk for bronchogenic carcinoma, follow-up chest CT at 12 months is recommended. This recommendation follows the consensus statement: Guidelines for Management of Small Pulmonary Nodules Detected on CT Scans: A Statement from the [employer] as published in Radiology 2005;237:395-400. 5. Atherosclerosis,  including left main and 3 vessel coronary artery disease. Assessment for potential risk factor modification, dietary therapy or pharmacologic therapy may be warranted, if clinically indicated.   Electronically Signed   By: Vinnie Langton M.D.   On: 11/27/2013 14:46    Patient seen by me we did CT of the chest to make sure there wasn't any underlying pulmonary edema or pneumonia that was being missed. It was negative patient stable for discharge home.  Mervin Kung, MD 11/30/13 [phone]

## 2014-07-07 ENCOUNTER — Encounter (INDEPENDENT_AMBULATORY_CARE_PROVIDER_SITE_OTHER): Payer: Medicare Other | Admitting: Ophthalmology

## 2014-07-07 DIAGNOSIS — H2510 Age-related nuclear cataract, unspecified eye: Secondary | ICD-10-CM

## 2014-07-07 DIAGNOSIS — H3581 Retinal edema: Secondary | ICD-10-CM

## 2014-07-07 DIAGNOSIS — H43819 Vitreous degeneration, unspecified eye: Secondary | ICD-10-CM

## 2014-07-07 DIAGNOSIS — E1139 Type 2 diabetes mellitus with other diabetic ophthalmic complication: Secondary | ICD-10-CM

## 2014-07-07 DIAGNOSIS — E1165 Type 2 diabetes mellitus with hyperglycemia: Secondary | ICD-10-CM

## 2014-07-07 DIAGNOSIS — E11319 Type 2 diabetes mellitus with unspecified diabetic retinopathy without macular edema: Secondary | ICD-10-CM

## 2014-07-07 DIAGNOSIS — H35039 Hypertensive retinopathy, unspecified eye: Secondary | ICD-10-CM

## 2014-07-07 DIAGNOSIS — I10 Essential (primary) hypertension: Secondary | ICD-10-CM

## 2014-11-10 ENCOUNTER — Ambulatory Visit (INDEPENDENT_AMBULATORY_CARE_PROVIDER_SITE_OTHER): Payer: Medicare Other | Admitting: Ophthalmology

## 2014-11-23 ENCOUNTER — Ambulatory Visit (INDEPENDENT_AMBULATORY_CARE_PROVIDER_SITE_OTHER): Payer: Medicare Other | Admitting: Ophthalmology

## 2014-11-23 DIAGNOSIS — H43813 Vitreous degeneration, bilateral: Secondary | ICD-10-CM

## 2014-11-23 DIAGNOSIS — E11329 Type 2 diabetes mellitus with mild nonproliferative diabetic retinopathy without macular edema: Secondary | ICD-10-CM

## 2014-11-23 DIAGNOSIS — H35033 Hypertensive retinopathy, bilateral: Secondary | ICD-10-CM

## 2014-11-23 DIAGNOSIS — I10 Essential (primary) hypertension: Secondary | ICD-10-CM

## 2014-11-23 DIAGNOSIS — E11339 Type 2 diabetes mellitus with moderate nonproliferative diabetic retinopathy without macular edema: Secondary | ICD-10-CM

## 2014-11-23 DIAGNOSIS — E11319 Type 2 diabetes mellitus with unspecified diabetic retinopathy without macular edema: Secondary | ICD-10-CM

## 2014-12-06 ENCOUNTER — Ambulatory Visit (INDEPENDENT_AMBULATORY_CARE_PROVIDER_SITE_OTHER): Payer: Medicare Other | Admitting: Ophthalmology

## 2015-06-07 ENCOUNTER — Ambulatory Visit (INDEPENDENT_AMBULATORY_CARE_PROVIDER_SITE_OTHER): Payer: Medicare Other | Admitting: Ophthalmology

## 2015-06-13 ENCOUNTER — Ambulatory Visit (INDEPENDENT_AMBULATORY_CARE_PROVIDER_SITE_OTHER): Payer: Medicare Other | Admitting: Ophthalmology

## 2015-10-25 ENCOUNTER — Encounter (HOSPITAL_COMMUNITY): Payer: Self-pay | Admitting: Emergency Medicine

## 2015-10-25 ENCOUNTER — Emergency Department (HOSPITAL_COMMUNITY): Payer: Medicare Other

## 2015-10-25 ENCOUNTER — Inpatient Hospital Stay (HOSPITAL_COMMUNITY)
Admission: EM | Admit: 2015-10-25 | Discharge: 2015-10-29 | DRG: 433 | Disposition: A | Discharge status: Home / Self Care | Payer: Medicare Other | Attending: Internal Medicine | Admitting: Internal Medicine

## 2015-10-25 DIAGNOSIS — G5693 Unspecified mononeuropathy of bilateral upper limbs: Secondary | ICD-10-CM | POA: Diagnosis present

## 2015-10-25 DIAGNOSIS — D61818 Other pancytopenia: Secondary | ICD-10-CM | POA: Diagnosis not present

## 2015-10-25 DIAGNOSIS — K7031 Alcoholic cirrhosis of liver with ascites: Principal | ICD-10-CM | POA: Diagnosis present

## 2015-10-25 DIAGNOSIS — F1021 Alcohol dependence, in remission: Secondary | ICD-10-CM | POA: Diagnosis present

## 2015-10-25 DIAGNOSIS — E1141 Type 2 diabetes mellitus with diabetic mononeuropathy: Secondary | ICD-10-CM | POA: Diagnosis present

## 2015-10-25 DIAGNOSIS — Z7984 Long term (current) use of oral hypoglycemic drugs: Secondary | ICD-10-CM

## 2015-10-25 DIAGNOSIS — E440 Moderate protein-calorie malnutrition: Secondary | ICD-10-CM | POA: Insufficient documentation

## 2015-10-25 DIAGNOSIS — Z87891 Personal history of nicotine dependence: Secondary | ICD-10-CM

## 2015-10-25 DIAGNOSIS — B1920 Unspecified viral hepatitis C without hepatic coma: Secondary | ICD-10-CM | POA: Diagnosis present

## 2015-10-25 DIAGNOSIS — I10 Essential (primary) hypertension: Secondary | ICD-10-CM | POA: Diagnosis present

## 2015-10-25 DIAGNOSIS — K8020 Calculus of gallbladder without cholecystitis without obstruction: Secondary | ICD-10-CM | POA: Diagnosis present

## 2015-10-25 DIAGNOSIS — B1910 Unspecified viral hepatitis B without hepatic coma: Secondary | ICD-10-CM | POA: Diagnosis present

## 2015-10-25 DIAGNOSIS — K7460 Unspecified cirrhosis of liver: Secondary | ICD-10-CM | POA: Diagnosis present

## 2015-10-25 DIAGNOSIS — E785 Hyperlipidemia, unspecified: Secondary | ICD-10-CM | POA: Diagnosis present

## 2015-10-25 DIAGNOSIS — K7030 Alcoholic cirrhosis of liver without ascites: Secondary | ICD-10-CM

## 2015-10-25 DIAGNOSIS — K766 Portal hypertension: Secondary | ICD-10-CM | POA: Diagnosis present

## 2015-10-25 DIAGNOSIS — R1310 Dysphagia, unspecified: Secondary | ICD-10-CM | POA: Diagnosis present

## 2015-10-25 DIAGNOSIS — I864 Gastric varices: Secondary | ICD-10-CM | POA: Diagnosis present

## 2015-10-25 DIAGNOSIS — R188 Other ascites: Secondary | ICD-10-CM | POA: Insufficient documentation

## 2015-10-25 DIAGNOSIS — I8510 Secondary esophageal varices without bleeding: Secondary | ICD-10-CM | POA: Insufficient documentation

## 2015-10-25 HISTORY — DX: Biliary acute pancreatitis without necrosis or infection: K85.10

## 2015-10-25 HISTORY — DX: Unspecified cirrhosis of liver: K74.60

## 2015-10-25 LAB — COMPREHENSIVE METABOLIC PANEL
ALBUMIN: 2.2 g/dL — AB (ref 3.5–5.0)
ALT: 38 U/L (ref 17–63)
AST: 54 U/L — AB (ref 15–41)
Alkaline Phosphatase: 238 U/L — ABNORMAL HIGH (ref 38–126)
Anion gap: 9 (ref 5–15)
BUN: 19 mg/dL (ref 6–20)
CHLORIDE: 106 mmol/L (ref 101–111)
CO2: 23 mmol/L (ref 22–32)
CREATININE: 1.48 mg/dL — AB (ref 0.61–1.24)
Calcium: 8.8 mg/dL — ABNORMAL LOW (ref 8.9–10.3)
GFR calc Af Amer: 51 mL/min — ABNORMAL LOW (ref >60)
GFR, EST NON AFRICAN AMERICAN: 44 mL/min — AB (ref >60)
Glucose, Bld: 186 mg/dL — ABNORMAL HIGH (ref 65–99)
Potassium: 4.1 mmol/L (ref 3.5–5.1)
Sodium: 138 mmol/L (ref 135–145)
Total Bilirubin: 0.8 mg/dL (ref 0.3–1.2)
Total Protein: 7.7 g/dL (ref 6.5–8.1)

## 2015-10-25 LAB — URINALYSIS, ROUTINE W REFLEX MICROSCOPIC
GLUCOSE, UA: 250 mg/dL — AB
Ketones, ur: 15 mg/dL — AB
LEUKOCYTES UA: NEGATIVE
Nitrite: NEGATIVE
PH: 5 (ref 5.0–8.0)
Specific Gravity, Urine: 1.026 (ref 1.005–1.030)

## 2015-10-25 LAB — CBC
HEMATOCRIT: 40.1 % (ref 39.0–52.0)
Hemoglobin: 13.5 g/dL (ref 13.0–17.0)
MCH: 31.2 pg (ref 26.0–34.0)
MCHC: 33.7 g/dL (ref 30.0–36.0)
MCV: 92.6 fL (ref 78.0–100.0)
PLATELETS: 115 10*3/uL — AB (ref 150–400)
RBC: 4.33 MIL/uL (ref 4.22–5.81)
RDW: 14.6 % (ref 11.5–15.5)
WBC: 4.4 10*3/uL (ref 4.0–10.5)

## 2015-10-25 LAB — URINE MICROSCOPIC-ADD ON: WBC, UA: NONE SEEN WBC/hpf (ref 0–5)

## 2015-10-25 LAB — LIPASE, BLOOD: Lipase: 37 U/L (ref 11–51)

## 2015-10-25 MED ORDER — SODIUM CHLORIDE 0.9 % IV BOLUS (SEPSIS)
1000.0000 mL | Freq: Once | INTRAVENOUS | Status: AC
  Administered 2015-10-25: 1000 mL via INTRAVENOUS

## 2015-10-25 NOTE — ED Notes (Signed)
Pt sts abd pain and distention; pt sent here from [facility] for further eval; pt sts no BM x 2 days; pt sts some swelling in feet and noted to be hypertensive

## 2015-10-25 NOTE — ED Provider Notes (Signed)
By signing my name below, I, Christian Wallace, attest that this documentation has been prepared under the direction and in the presence of [place], DO.  Electronically Signed: Forrestine Wallace, ED Scribe. 10/25/2015. 11:31 PM.   TIME SEEN: 11:16 PM   CHIEF COMPLAINT:  Chief Complaint  Patient presents with  . Abdominal Pain     HPI:  HPI Comments: Christian Wallace is a 74 y.o. male with a PMHx of DM, hypertension, diabetes, cholelithiasis who presents to the Emergency Department complaining of constant, ongoing diffuse abdominal pain with associated distention x 5 days. No aggravating or alleviating factors at this time. No OTC medications or home remedies attempted prior to arrival. Relative also reports ongoing foot swelling bilaterally. No recent fever, chills, nausea, vomiting, or diarrhea. Last bowel movement 3 days ago which is normal for Wallace. No bloody stool or melena. Pt is still able to pass gas as normal. Mr. [name] was sent over from Urgent Care for further evaluated and possible CT scan. No history of liver disease, heart failure, or bowel obstructions. No recent long distance travel. No known sick contacts. No history of abdominal surgeries. Family denies history of alcohol use.  PCP: Everardo Beals, MD    ROS: See HPI Constitutional: no fever  Eyes: no drainage  ENT: no runny nose   Cardiovascular:  no chest pain. Positive for leg swelling  Resp: no SOB  GI: no vomiting. Positive abdominal pain and distention  GU: no dysuria Integumentary: no rash  Allergy: no hives  Musculoskeletal: no leg swelling  Neurological: no slurred speech ROS otherwise negative  PAST MEDICAL HISTORY/PAST SURGICAL HISTORY:  Past Medical History  Diagnosis Date  . Diabetes mellitus   . HTN (hypertension)   . HLD (hyperlipidemia)   . Anginal pain ([place])   . Dysphagia   . Neuromuscular disorder ([place])     muscle pain in arms    MEDICATIONS:  Prior to Admission medications   Medication Sig Start  Date End Date Taking? Authorizing Provider  benzonatate (TESSALON) 100 MG capsule Take 1 capsule (100 mg total) by mouth every 8 (eight) hours. 11/27/13   Domenic Moras, PA-C  HYDROcodone-homatropine (HYCODAN) 5-1.5 MG/5ML syrup Take 5 mLs by mouth every 6 (six) hours as needed for cough. 11/27/13   Domenic Moras, PA-C  lisinopril-hydrochlorothiazide (PRINZIDE,ZESTORETIC) 20-25 MG per tablet Take 1 tablet by mouth daily.  04/30/12   Historical Provider, MD  metFORMIN (GLUCOPHAGE) 1000 MG tablet Take 1,000 mg by mouth 2 (two) times daily.    Historical Provider, MD  simvastatin (ZOCOR) 40 MG tablet Take 40 mg by mouth daily.  05/27/12   Historical Provider, MD    ALLERGIES:  No Known Allergies  SOCIAL HISTORY:  Social History  Substance Use Topics  . Smoking status: Former Smoker    Types: Cigarettes  . Smokeless tobacco: Never Used  . Alcohol Use: Yes     Comment: heavy ETOH consumption in past    FAMILY HISTORY: History reviewed. No pertinent family history.  EXAM: BP 191/102 mmHg  Pulse 84  Temp(Src) 98 F (36.7 C) (Oral)  Resp 24  Wt 126 lb 2 oz (57.21 kg)  SpO2 100% CONSTITUTIONAL: Alert and oriented and responds appropriately to questions. Appears older than stated age, thin, Elderly and chronically ill appearing  HEAD: Normocephalic EYES: Conjunctivae clear, PERRL ENT: normal nose; no rhinorrhea; moist mucous membranes; pharynx without lesions noted NECK: Supple, no meningismus, no LAD  CARD: RRR; S1 and S2 appreciated; no murmurs, no  clicks, no rubs, no gallops RESP: Normal chest excursion without splinting or tachypnea; breath sounds clear and equal bilaterally; no wheezes, no rhonchi, no rales, no hypoxia or respiratory distress, speaking full sentences ABD/GI: Normal bowel sounds; mildly distended with mild tympany but also fluid wave; soft, non-tender, no rebound, no guarding, no peritoneal signs RECTAL:  Normal rectal tone, no gross blood or melena, guaiac negative, no  hemorrhoids appreciated, nontender rectal exam BACK:  The back appears normal and is non-tender to palpation, there is no CVA tenderness EXT: Normal ROM in all joints; non-tender to palpation; no edema; normal capillary refill; no cyanosis, no calf tenderness or swelling    SKIN: Normal color for age and race; warm NEURO: Moves all extremities equally, sensation to light touch intact diffusely, cranial nerves II through XII intact PSYCH: The patient's mood and manner are appropriate. Grooming and personal hygiene are appropriate.  MEDICAL DECISION MAKING: Patient here with abdominal pain and distention. Differential diagnosis includes ascites, bowel obstruction, colitis. Abdominal exam is benign. Doubt SBP. Family denies history of alcohol use. Labs show mild thrombocytopenia with platelets of 15,000. AST and alkaline phosphatase is mildly elevated. Creatinine mildly elevated at 1.48. Sent from urgent care. We'll obtain CT of his abdomen and pelvis for further evaluation.  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 Contrast at the distal esophagus and gastroesophageal junction compatible with a variceal bleed. He denies any vomiting. He is guaiac negative on exam. There is moderate ascites seen on CT scan. Again no sign of SBP. Will give dose of IV Protonix. Discussed with hospitalist Dr. Alcario Drought who will admit patient. He will consult gastroenterology not emergently in the morning. We have both agreed to hold on octreotide at this time given no active bleeding seen clinically.  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     I personally performed the services described in this documentation, which was scribed in my presence. The recorded information has been reviewed and is accurate.   [place], DO 10/26/15 [phone]

## 2015-10-26 ENCOUNTER — Encounter (HOSPITAL_COMMUNITY): Payer: Self-pay

## 2015-10-26 ENCOUNTER — Emergency Department (HOSPITAL_COMMUNITY): Payer: Medicare Other

## 2015-10-26 DIAGNOSIS — K7031 Alcoholic cirrhosis of liver with ascites: Principal | ICD-10-CM

## 2015-10-26 DIAGNOSIS — D649 Anemia, unspecified: Secondary | ICD-10-CM

## 2015-10-26 DIAGNOSIS — R188 Other ascites: Secondary | ICD-10-CM

## 2015-10-26 DIAGNOSIS — K7460 Unspecified cirrhosis of liver: Secondary | ICD-10-CM | POA: Diagnosis present

## 2015-10-26 DIAGNOSIS — E440 Moderate protein-calorie malnutrition: Secondary | ICD-10-CM | POA: Insufficient documentation

## 2015-10-26 DIAGNOSIS — I8510 Secondary esophageal varices without bleeding: Secondary | ICD-10-CM

## 2015-10-26 DIAGNOSIS — K7030 Alcoholic cirrhosis of liver without ascites: Secondary | ICD-10-CM | POA: Diagnosis not present

## 2015-10-26 LAB — BASIC METABOLIC PANEL
Anion gap: 5 (ref 5–15)
BUN: 19 mg/dL (ref 6–20)
CALCIUM: 7.9 mg/dL — AB (ref 8.9–10.3)
CO2: 24 mmol/L (ref 22–32)
CREATININE: 1.22 mg/dL (ref 0.61–1.24)
Chloride: 108 mmol/L (ref 101–111)
GFR calc non Af Amer: 56 mL/min — ABNORMAL LOW (ref >60)
Glucose, Bld: 116 mg/dL — ABNORMAL HIGH (ref 65–99)
Potassium: 4.1 mmol/L (ref 3.5–5.1)
SODIUM: 137 mmol/L (ref 135–145)

## 2015-10-26 LAB — SAMPLE TO BLOOD BANK

## 2015-10-26 LAB — CBC
HCT: 34 % — ABNORMAL LOW (ref 39.0–52.0)
Hemoglobin: 11.1 g/dL — ABNORMAL LOW (ref 13.0–17.0)
MCH: 29.8 pg (ref 26.0–34.0)
MCHC: 32.6 g/dL (ref 30.0–36.0)
MCV: 91.4 fL (ref 78.0–100.0)
PLATELETS: 93 10*3/uL — AB (ref 150–400)
RBC: 3.72 MIL/uL — ABNORMAL LOW (ref 4.22–5.81)
RDW: 14.4 % (ref 11.5–15.5)
WBC: 3.5 10*3/uL — ABNORMAL LOW (ref 4.0–10.5)

## 2015-10-26 LAB — PROTIME-INR
INR: 1.14 (ref 0.00–1.49)
PROTHROMBIN TIME: 14.8 s (ref 11.6–15.2)

## 2015-10-26 LAB — POC OCCULT BLOOD, ED: Fecal Occult Bld: NEGATIVE

## 2015-10-26 LAB — GLUCOSE, CAPILLARY: GLUCOSE-CAPILLARY: 109 mg/dL — AB (ref 65–99)

## 2015-10-26 LAB — APTT: aPTT: 31 seconds (ref 24–37)

## 2015-10-26 MED ORDER — IOHEXOL 300 MG/ML  SOLN
100.0000 mL | Freq: Once | INTRAMUSCULAR | Status: AC | PRN
  Administered 2015-10-26: 80 mL via INTRAVENOUS

## 2015-10-26 MED ORDER — SODIUM CHLORIDE 0.9 % IV SOLN: INTRAVENOUS | Status: DC

## 2015-10-26 MED ORDER — SIMVASTATIN 40 MG PO TABS
40.0000 mg | ORAL_TABLET | Freq: Every day | ORAL | Status: DC
  Administered 2015-10-26 – 2015-10-28 (×3): 40 mg via ORAL
  Filled 2015-10-26 (×3): qty 1

## 2015-10-26 MED ORDER — METFORMIN HCL 500 MG PO TABS: 1000.0000 mg | ORAL_TABLET | Freq: Two times a day (BID) | ORAL | Status: DC

## 2015-10-26 MED ORDER — LISINOPRIL-HYDROCHLOROTHIAZIDE 20-25 MG PO TABS: 1.0000 | ORAL_TABLET | Freq: Every day | ORAL | Status: DC

## 2015-10-26 MED ORDER — PANTOPRAZOLE SODIUM 40 MG IV SOLR
40.0000 mg | Freq: Once | INTRAVENOUS | Status: AC
  Administered 2015-10-26: 40 mg via INTRAVENOUS
  Filled 2015-10-26: qty 40

## 2015-10-26 MED ORDER — BOOST / RESOURCE BREEZE PO LIQD
1.0000 | Freq: Three times a day (TID) | ORAL | Status: DC
  Administered 2015-10-27 – 2015-10-29 (×3): 1 via ORAL

## 2015-10-26 MED ORDER — HYDROCHLOROTHIAZIDE 25 MG PO TABS
25.0000 mg | ORAL_TABLET | Freq: Every day | ORAL | Status: DC
  Administered 2015-10-26 – 2015-10-28 (×3): 25 mg via ORAL
  Filled 2015-10-26 (×4): qty 1

## 2015-10-26 MED ORDER — LISINOPRIL 20 MG PO TABS
20.0000 mg | ORAL_TABLET | Freq: Every day | ORAL | Status: DC
  Administered 2015-10-26 – 2015-10-28 (×3): 20 mg via ORAL
  Filled 2015-10-26 (×3): qty 1

## 2015-10-26 MED ORDER — SODIUM CHLORIDE 0.9 % IV SOLN
INTRAVENOUS | Status: AC
  Administered 2015-10-26: 05:00:00 via INTRAVENOUS

## 2015-10-26 MED ORDER — DEXTROSE 5 % IV SOLN
1.0000 g | INTRAVENOUS | Status: DC
  Administered 2015-10-26: 1 g via INTRAVENOUS
  Filled 2015-10-26 (×2): qty 10

## 2015-10-26 NOTE — Consult Note (Signed)
[facility] Consult: 3:04 PM 10/26/2015   Referring Provider: Dr Eulogio Bear  Primary Care Physician:  Willey Blade MD Primary Gastroenterologist:  Dr. Benson Norway in past inpt encounter only.     Reason for Consultation:  Cirrhosis, question esophageal variceal bleeding   HPI: Christian Wallace is a 74 y.o. male.  [place] man, speaks no [place]. Hx biliary pancreatitis.  S/p ERCP with stone extraction 2013.  Never underwent cholecystectomy.  Cirrhosis but no hepatoma by follow up 05/2012 CT scan and MRI.  2013:  Hep C Ab +, Hep B surface Ab and core Ab +, Hep B Surface Ag -    .  Herniated disc 2003. Diastolic dysfunction, anemia, thrombocytopenia, hyperglycemia/?DM2.  Never returned for any liver follow up.    At least a week of pain in the mid abdomen. It is diffuse. The abdomen is swelling.Last BM 3 days PTA, normal pattern for him. He's had at least a month of edema developing in the legs. He went to the urgent care center yesterday.  He was hypertensive and they were concerned and sent him to the emergency department  Pt admitted overnight.   CT shows cirrhosis, portal htn and esophageal and gastric varices, moderate ascites.  Contrast is distal esophagus/GE jx suggest variceal bleed.  Cholelithiasis.   Colonic thickening likely due to ascites/cirrhosis and not colitis.   Hgb 11.1, down from 13.5 last night and from 12. In 11/2013.  MCV normal. Platelets 93 coags normal.   Patient denies any unusual bleeding, no epistaxis, no bleeding per rectum. No nausea or vomiting. The family believes he's lost some weight despite the lower extremity and abdominal swelling. However he weighed 120 # in 04/2012 and 125# currently.  Family notes increased somnolence. He is forgetful, will ask the same question repeatedly within 5-10  minutes. He last consumed alcohol about a year ago.    Past Medical History  Diagnosis Date  . Diabetes mellitus 2014  . HTN (hypertension)   . HLD (hyperlipidemia)   . Anginal pain ([place])   . Dysphagia   . Neuromuscular disorder (HCC)     muscle pain in arms  . Cirrhosis ([place]) 05/2012    Hep C Ab +, Hep B surface Ab and core Ab +, Hep B Surface Ag -  . Biliary acute pancreatitis 05/2012    Past Surgical History  Procedure Laterality Date  . No past surgeries    . Ercp  05/22/2012    Procedure: ENDOSCOPIC RETROGRADE CHOLANGIOPANCREATOGRAPHY (ERCP);  Surgeon: Beryle Beams, MD;  Location: [facility];  Service: Endoscopy;  Laterality: N/A;    Prior to Admission medications   Medication Sig Start Date End Date Taking? Authorizing Provider  lisinopril-hydrochlorothiazide (PRINZIDE,ZESTORETIC) 20-25 MG per tablet Take 1 tablet by mouth daily.  04/30/12  Yes Historical Provider, MD  metFORMIN (GLUCOPHAGE) 1000 MG tablet Take 1,000 mg by mouth 2 (two) times daily.   Yes Historical Provider, MD  simvastatin (ZOCOR) 40 MG tablet Take 40 mg by mouth daily.  05/27/12  Yes Historical Provider, MD    Scheduled  Meds: . sodium chloride   Intravenous STAT  . feeding supplement  1 Container Oral TID BM  . lisinopril  20 mg Oral Daily   And  . hydrochlorothiazide  25 mg Oral Daily  . [START ON 10/28/2015] metFORMIN  1,000 mg Oral BID WC  . simvastatin  40 mg Oral q1800   Infusions:   PRN Meds:    Allergies as of 10/25/2015  . (No Known Allergies)    History reviewed. No pertinent family history.  Social History   Social History  . Marital Status: Married    Spouse Name: N/A  . Number of Children: N/A  . Years of Education: N/A   Occupational History  . Not on file.   Social History Main Topics  . Smoking status: Former Smoker    Types: Cigarettes  . Smokeless tobacco: Never Used  . Alcohol Use: Yes     Comment: heavy ETOH consumption in past  . Drug Use: No  . Sexual  Activity: No   Other Topics Concern  . Not on file   Social History Narrative    REVIEW OF SYSTEMS: Constitutional:  Per HPI ENT:  No nose bleeds Pulm:  DOE CV:  No palpitations, no LE edema.  GU:  No hematuria, no frequency GI:  Per HPI Heme:  Per HPI   Transfusions:  None ever Neuro:  No headaches, no peripheral tingling or numbness.  No falls Derm:  No itching, no rash or sores.  Endocrine:  No sweats or chills.  No polyuria or dysuria Immunization:  Not queried.  Travel:  None beyond local counties in last few months.    PHYSICAL EXAM: Vital signs in last 24 hours: Filed Vitals:   10/26/15 0418 10/26/15 0950  BP: 177/92 159/79  Pulse: 71 69  Temp: 98.5 F (36.9 C) 98.6 F (37 C)  Resp: 19 19   Wt Readings from Last 3 Encounters:  10/26/15 57.063 kg (125 lb 12.8 oz)  11/27/13 53.78 kg (118 lb 9 oz)  05/22/12 54.432 kg (120 lb)    General: Elderly, unwell-appearing Asian man. He is comfortable and lying quietly in the bed.  Interviewed with the assist of his daughter at bedside as well as through a phone call to another daughter. Head:  No facial asymmetry or swelling.  Eyes:  No conjunctival pallor or scleral icterus. Ears:  No obvious hearing loss  Nose:  No discharge Mouth:  Dentures in place. Oral mucosa pink and clear, moist Neck:  No mass, no JVD. No TMG. Lungs:  Globally diminished breath sounds, though inspiratory effort is marginal. No adventitious breath sounds Heart: RRR. No MRG. S1/S2 audible. Abdomen:  Somewhat protuberant and moderately tense. Nontender. Bowel sounds diminished. No obvious fluid wave or bulging flanks..   Rectal: Deferred   Musc/Skeltl: No joint erythema, contracture deformities or swelling. Extremities:  2-3+ pedal edema.  Neurologic:  Alert. Oriented 3. No tremor. No asterixis. Skin:  No telangiectasia, sores or rashes. Tattoos:  On the left forearm. Nodes:  No cervical adenopathy.   Psych:  Quiet, calm,  cooperative.   LAB RESULTS:  Recent Labs  10/25/15 1722 10/26/15 0306  WBC 4.4 3.5*  HGB 13.5 11.1*  HCT 40.1 34.0*  PLT 115* 93*   BMET Lab Results  Component Value Date   NA 137 10/26/2015   NA 138 10/25/2015   NA 135* 11/27/2013   K 4.1 10/26/2015   K 4.1 10/25/2015   K 4.4 11/27/2013   CL 108  10/26/2015   CL 106 10/25/2015   CL 98 11/27/2013   CO2 24 10/26/2015   CO2 23 10/25/2015   CO2 26 11/27/2013   GLUCOSE 116* 10/26/2015   GLUCOSE 186* 10/25/2015   GLUCOSE 226* 11/27/2013   BUN 19 10/26/2015   BUN 19 10/25/2015   BUN 27* 11/27/2013   CREATININE 1.22 10/26/2015   CREATININE 1.48* 10/25/2015   CREATININE 1.31 11/27/2013   CALCIUM 7.9* 10/26/2015   CALCIUM 8.8* 10/25/2015   CALCIUM 8.8 11/27/2013   LFT  Recent Labs  10/25/15 1722  PROT 7.7  ALBUMIN 2.2*  AST 54*  ALT 38  ALKPHOS 238*  BILITOT 0.8   PT/INR Lab Results  Component Value Date   INR 1.14 10/26/2015   INR 0.95 05/24/2012   Lipase     Component Value Date/Time   LIPASE 37 10/25/2015 1722    RADIOLOGY STUDIES: Ct Abdomen Pelvis W Contrast  10/26/2015  CLINICAL DATA:  74 year old male with abdominal distention x5 6 days. Diffuse abdominal pain. EXAM: CT ABDOMEN AND PELVIS WITH CONTRAST TECHNIQUE: Multidetector CT imaging of the abdomen and pelvis was performed using the standard protocol following bolus administration of intravenous contrast. CONTRAST:  28mL OMNIPAQUE IOHEXOL 300 MG/ML  SOLN COMPARISON:  Abdominal CT dated 05/28/2012 and chest CT dated 11/27/2013 FINDINGS: There bibasilar dependent atelectatic changes/scarring. Stable mild cardiomegaly. There is coronary vascular calcification. No intra-abdominal free air. Moderate diffuse ascites, new from prior study. There are morphologic changes of cirrhosis with surface irregularity of the liver. Multiple gallstones. Evaluation of the gallbladder is limited due to ascites. The pancreas, spleen, and adrenal glands appear  unremarkable. The kidneys, visualized ureters, and urinary bladder appear unremarkable. The prostate gland and seminal vesicles are grossly unremarkable. There is a focal area of contrast collection at the gastroesophageal junction extending into the distal esophagus compatible with variceal bleed. There is no evidence of bowel obstruction. There is apparent thickening of the ascending colon, likely related to underdistention and secondary to hepatic colopathy. Colitis is less likely but not excluded. Clinical correlation is recommended. The appendix appears unremarkable. Aortoiliac atherosclerotic disease. The origins of the celiac axis, SMA, IMA as well as the origins of the renal arteries are patent. No portal venous gas identified. Paraesophageal and gastric varices noted. There is no lymphadenopathy. There is multilevel degenerative changes of the spine. No acute fracture. There is diffuse subcutaneous soft tissue stranding and anasarca. IMPRESSION: Cirrhosis with evidence of portal hypertension and paraesophageal and gastric versus. Contrast noted at the distal esophagus and gastroesophageal junction compatible with variceal bleed. Moderate ascites, new from prior study. Apparent thickening of the ascending colon likely related to cirrhosis and ascites and less likely colitis. Clinical correlation is recommended. No bowel obstruction. Cholelithiasis. Electronically Signed   By: Anner Crete M.D.   On: 10/26/2015 00:47   Images viewed ENDOSCOPIC STUDIES: ERCP 05/2012.  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  Hep C Ab +, Hep B surface Ab and core Ab +, Hep B Surface Ag -   *  Esophageal, gastric varices and portal hypertension as well as question of esophageal variceal bleeding on CT scan.  *  Ascites. Need to rule out SBP.  *  History of biliary pancreatitis. Never underwent Colace cystectomy, suspect he was lost to surgical follow-up.  *  Type II DM.   Managed with oral meds.  *  Significant language barrier, patient only speaks [place].   PLAN:     *  Paracentesis,   *  EGD tomorrow.  Ok to have clears tonight.    Azucena Freed  10/26/2015, 3:04 PM Pager: [phone]    [facility] Attending  I have taken a history, examined the patient and reviewed the chart. I agree with the Advanced Practitioner's note, impression and recommendations. Any additions are listed below or above in body of note.   Doubt he has active bleeding but possible - maybe the radiologist saw contrast filled varices. EGD appropriate anyway. Discussed with daughter.   Gatha Mayer, MD, [facility] [phone] (pager) 10/26/2015 4:33 PM

## 2015-10-26 NOTE — Progress Notes (Addendum)
Patient admitted after midnight, please see H&P.  Heme neg but admitted for GI consult and possible EGD.  Patient with epigastric pain-- lipase normal, LFTs normal -cirrhosis on CT Scan Consulted GI  Eulogio Bear DO

## 2015-10-26 NOTE — Progress Notes (Signed)
Telephonic interpreter services at [phone] used to verify with pt that he understood the procedure scheduled for 10/27/15 with Dr. Lorayne Bender at 1545 of Upper Endoscopy. Pt verifies that he has no further questions and is willing to sign consent. Pt is also educated that he must remain nothing by mouth after midnight tonight. Family present at the bedside as well and verbalize understanding of the use of telephonic interpreter services to obtain consent. Consent signed and placed in patient chart. Dorthey Sawyer, RN

## 2015-10-26 NOTE — H&P (Signed)
[facility] History and Physical  Christian Wallace MR:[id number] DOB: April 29, 1941 DOA: 10/25/2015  Referring physician: EDP PCP: No primary care provider on file.   Chief Complaint: Abdominal pain and distention   HPI: Christian Wallace is a 74 y.o. male with remote history of EtOH abuse in the past.  Patient presents to the ED with abd pain and distention for past 5 days.  Last BM 3 days ago, but this is normal for the patient.  Patient referred to ED from [facility] for possible CT scan.  CT scan shows cirrhosis of the liver, ascites, and they even suggest an esophageal variceal bleed based on CT scan.  No clinical evidence of this though (no hematemesis, melena, stool is guiac negative, etc.).  Review of Systems: Systems reviewed.  As above, otherwise negative  Past Medical History  Diagnosis Date  . Diabetes mellitus   . HTN (hypertension)   . HLD (hyperlipidemia)   . Anginal pain ([place])   . Dysphagia   . Neuromuscular disorder ([facility])     muscle pain in arms   Past Surgical History  Procedure Laterality Date  . No past surgeries    . Ercp  05/22/2012    Procedure: ENDOSCOPIC RETROGRADE CHOLANGIOPANCREATOGRAPHY (ERCP);  Surgeon: Beryle Beams, MD;  Location: [facility];  Service: Endoscopy;  Laterality: N/A;   Social History:  reports that he has quit smoking. His smoking use included Cigarettes. He has never used smokeless tobacco. He reports that he drinks alcohol. He reports that he does not use illicit drugs.  No Known Allergies  History reviewed. No pertinent family history.   Prior to Admission medications   Medication Sig Start Date End Date Taking? Authorizing Provider  lisinopril-hydrochlorothiazide (PRINZIDE,ZESTORETIC) 20-25 MG per tablet Take 1 tablet by mouth daily.  04/30/12  Yes Historical Provider, MD  metFORMIN (GLUCOPHAGE) 1000 MG tablet Take 1,000 mg by mouth 2 (two) times daily.   Yes Historical Provider, MD  simvastatin (ZOCOR) 40 MG tablet Take 40 mg by mouth daily.  05/27/12   Yes Historical Provider, MD   Physical Exam: Filed Vitals:   10/25/15 2315 10/25/15 2330  BP: 178/99 175/98  Pulse: 74 72  Temp:    Resp:      BP 175/98 mmHg  Pulse 72  Temp(Src) 98 F (36.7 C) (Oral)  Resp 24  Wt 57.21 kg (126 lb 2 oz)  SpO2 100%  General Appearance:    Alert, oriented, no distress, appears stated age  Head:    Normocephalic, atraumatic  Eyes:    PERRL, EOMI, sclera non-icteric        Nose:   Nares without drainage or epistaxis. Mucosa, turbinates normal  Throat:   Moist mucous membranes. Oropharynx without erythema or exudate.  Neck:   Supple. No carotid bruits.  No thyromegaly.  No lymphadenopathy.   Back:     No CVA tenderness, no spinal tenderness  Lungs:     Clear to auscultation bilaterally, without wheezes, rhonchi or rales  Chest wall:    No tenderness to palpitation  Heart:    Regular rate and rhythm without murmurs, gallops, rubs  Abdomen:     Soft, non-tender, nondistended, normal bowel sounds, no organomegaly  Genitalia:    deferred  Rectal:    deferred  Extremities:   No clubbing, cyanosis or edema.  Pulses:   2+ and symmetric all extremities  Skin:   Skin color, texture, turgor normal, no rashes or lesions  Lymph nodes:   Cervical, supraclavicular, and  axillary nodes normal  Neurologic:   CNII-XII intact. Normal strength, sensation and reflexes      throughout    Labs on Admission:  Basic Metabolic Panel:  Recent Labs Lab 10/25/15 1722  NA 138  K 4.1  CL 106  CO2 23  GLUCOSE 186*  BUN 19  CREATININE 1.48*  CALCIUM 8.8*   Liver Function Tests:  Recent Labs Lab 10/25/15 1722  AST 54*  ALT 38  ALKPHOS 238*  BILITOT 0.8  PROT 7.7  ALBUMIN 2.2*    Recent Labs Lab 10/25/15 1722  LIPASE 37   No results for input(s): AMMONIA in the last 168 hours. CBC:  Recent Labs Lab 10/25/15 1722  WBC 4.4  HGB 13.5  HCT 40.1  MCV 92.6  PLT 115*   Cardiac Enzymes: No results for input(s): CKTOTAL, CKMB, CKMBINDEX,  TROPONINI in the last 168 hours.  BNP (last 3 results) No results for input(s): PROBNP in the last 8760 hours. CBG: No results for input(s): GLUCAP in the last 168 hours.  Radiological Exams on Admission: Ct Abdomen Pelvis W Contrast  10/26/2015  CLINICAL DATA:  74 year old male with abdominal distention x5 6 days. Diffuse abdominal pain. EXAM: CT ABDOMEN AND PELVIS WITH CONTRAST TECHNIQUE: Multidetector CT imaging of the abdomen and pelvis was performed using the standard protocol following bolus administration of intravenous contrast. CONTRAST:  11mL OMNIPAQUE IOHEXOL 300 MG/ML  SOLN COMPARISON:  Abdominal CT dated 05/28/2012 and chest CT dated 11/27/2013 FINDINGS: There bibasilar dependent atelectatic changes/scarring. Stable mild cardiomegaly. There is coronary vascular calcification. No intra-abdominal free air. Moderate diffuse ascites, new from prior study. There are morphologic changes of cirrhosis with surface irregularity of the liver. Multiple gallstones. Evaluation of the gallbladder is limited due to ascites. The pancreas, spleen, and adrenal glands appear unremarkable. The kidneys, visualized ureters, and urinary bladder appear unremarkable. The prostate gland and seminal vesicles are grossly unremarkable. There is a focal area of contrast collection at the gastroesophageal junction extending into the distal esophagus compatible with variceal bleed. There is no evidence of bowel obstruction. There is apparent thickening of the ascending colon, likely related to underdistention and secondary to hepatic colopathy. Colitis is less likely but not excluded. Clinical correlation is recommended. The appendix appears unremarkable. Aortoiliac atherosclerotic disease. The origins of the celiac axis, SMA, IMA as well as the origins of the renal arteries are patent. No portal venous gas identified. Paraesophageal and gastric varices noted. There is no lymphadenopathy. There is multilevel degenerative  changes of the spine. No acute fracture. There is diffuse subcutaneous soft tissue stranding and anasarca. IMPRESSION: Cirrhosis with evidence of portal hypertension and paraesophageal and gastric versus. Contrast noted at the distal esophagus and gastroesophageal junction compatible with variceal bleed. Moderate ascites, new from prior study. Apparent thickening of the ascending colon likely related to cirrhosis and ascites and less likely colitis. Clinical correlation is recommended. No bowel obstruction. Cholelithiasis. Electronically Signed   By: Anner Crete M.D.   On: 10/26/2015 00:47    EKG: Independently reviewed.  Assessment/Plan Active Problems:   Cirrhosis of liver with ascites ([place])   1. Cirrhosis of the liver with ascites and possible esophageal varices - 1. Admit for observation 2. Consult GI in AM re: possible EGD to go after the esophageal varices 3. Clear liquid diet for now 4. Repeat labs in AM    Code Status: Full  Family Communication: Daughter at bedside who acts as Optometrist Disposition Plan: Admit to obs   Time spent: 62  min  GARDNER, JARED M. [facility] Pager [phone]  If 7AM-7PM, please contact the day team taking care of the patient [email or web address] Password [email or web address] 10/26/2015, 2:22 AM

## 2015-10-26 NOTE — Progress Notes (Signed)
Initial Nutrition Assessment  DOCUMENTATION CODES:   Non-severe (moderate) malnutrition in context of chronic illness  INTERVENTION:  Continue Boost Breeze po TID, each supplement provides 250 kcal and 9 grams of protein.  Encourage adequate PO intake.  NUTRITION DIAGNOSIS:   Malnutrition related to chronic illness as evidenced by moderate depletions of muscle mass, severe depletion of muscle mass.  GOAL:   Patient will meet greater than or equal to 90% of their needs  MONITOR:   PO intake, Supplement acceptance, Diet advancement, Weight trends, Labs, I & O's  REASON FOR ASSESSMENT:   Malnutrition Screening Tool    ASSESSMENT:   74 y.o. male with remote history of EtOH abuse in the past. Patient presents to the ED with abd pain and distention for past 5 days. Last BM 3 days ago, but this is normal for the patient. Patient referred to ED from [facility] for possible CT scan. CT scan shows cirrhosis of the liver, ascites, and they even suggest an esophageal variceal bleed based on CT scan.  Pt reports his abdominal pains have been on and off. He reports eating fine at home PTA with no other difficulties. Patient does report 0% intake today. Per Epic weight records, pt with no weight loss, however noted with with ascites and +2 LE edema. Patient currently has Boost Breeze ordered. RD to continue with current orders. Pt encouraged to consume his food at meals.   Nutrition-Focused physical exam completed. Findings are moderate fat depletion, severe muscle depletion, and moderate edema.   Labs and medications reviewed.   Diet Order:  Diet clear liquid Room service appropriate?: Yes; Fluid consistency:: Thin  Skin:   (+2 LE edema)  Last BM:  11/27  Height:   Ht Readings from Last 1 Encounters:  05/22/12 5\' 6"  (1.676 m)    Weight:   Wt Readings from Last 1 Encounters:  10/26/15 125 lb 12.8 oz (57.063 kg)    Ideal Body Weight:  64.5 kg  BMI:  Body mass index is 20.31  kg/(m^2).  Estimated Nutritional Needs:   Kcal:  [id number]  Protein:  85-95 grams  Fluid:  1.7 - 1.9 L/day  EDUCATION NEEDS:   No education needs identified at this time  Corrin Parker, MS, RD, LDN Pager # [phone] After hours/ weekend pager # [phone]

## 2015-10-26 NOTE — ED Notes (Signed)
Patient transported to CT 

## 2015-10-27 ENCOUNTER — Encounter (HOSPITAL_COMMUNITY)
Admission: EM | Disposition: A | Discharge status: Home / Self Care | Payer: Self-pay | Source: Home / Self Care | Attending: Internal Medicine

## 2015-10-27 ENCOUNTER — Observation Stay (HOSPITAL_COMMUNITY): Payer: Medicare Other

## 2015-10-27 ENCOUNTER — Encounter (HOSPITAL_COMMUNITY): Payer: Self-pay | Admitting: *Deleted

## 2015-10-27 DIAGNOSIS — R188 Other ascites: Secondary | ICD-10-CM | POA: Diagnosis not present

## 2015-10-27 DIAGNOSIS — K7030 Alcoholic cirrhosis of liver without ascites: Secondary | ICD-10-CM | POA: Diagnosis present

## 2015-10-27 DIAGNOSIS — E440 Moderate protein-calorie malnutrition: Secondary | ICD-10-CM

## 2015-10-27 DIAGNOSIS — Z87891 Personal history of nicotine dependence: Secondary | ICD-10-CM | POA: Diagnosis not present

## 2015-10-27 DIAGNOSIS — R1310 Dysphagia, unspecified: Secondary | ICD-10-CM | POA: Diagnosis present

## 2015-10-27 DIAGNOSIS — K766 Portal hypertension: Secondary | ICD-10-CM | POA: Diagnosis present

## 2015-10-27 DIAGNOSIS — I8510 Secondary esophageal varices without bleeding: Secondary | ICD-10-CM | POA: Diagnosis not present

## 2015-10-27 DIAGNOSIS — K7031 Alcoholic cirrhosis of liver with ascites: Secondary | ICD-10-CM | POA: Diagnosis present

## 2015-10-27 DIAGNOSIS — F1021 Alcohol dependence, in remission: Secondary | ICD-10-CM | POA: Diagnosis present

## 2015-10-27 DIAGNOSIS — I864 Gastric varices: Secondary | ICD-10-CM | POA: Diagnosis present

## 2015-10-27 DIAGNOSIS — G5693 Unspecified mononeuropathy of bilateral upper limbs: Secondary | ICD-10-CM | POA: Diagnosis present

## 2015-10-27 DIAGNOSIS — D61818 Other pancytopenia: Secondary | ICD-10-CM | POA: Diagnosis not present

## 2015-10-27 DIAGNOSIS — K8020 Calculus of gallbladder without cholecystitis without obstruction: Secondary | ICD-10-CM | POA: Diagnosis present

## 2015-10-27 DIAGNOSIS — B1910 Unspecified viral hepatitis B without hepatic coma: Secondary | ICD-10-CM | POA: Diagnosis present

## 2015-10-27 DIAGNOSIS — B1920 Unspecified viral hepatitis C without hepatic coma: Secondary | ICD-10-CM | POA: Diagnosis present

## 2015-10-27 DIAGNOSIS — E1141 Type 2 diabetes mellitus with diabetic mononeuropathy: Secondary | ICD-10-CM | POA: Diagnosis present

## 2015-10-27 DIAGNOSIS — E785 Hyperlipidemia, unspecified: Secondary | ICD-10-CM | POA: Diagnosis present

## 2015-10-27 DIAGNOSIS — Z7984 Long term (current) use of oral hypoglycemic drugs: Secondary | ICD-10-CM | POA: Diagnosis not present

## 2015-10-27 DIAGNOSIS — K7460 Unspecified cirrhosis of liver: Secondary | ICD-10-CM | POA: Diagnosis not present

## 2015-10-27 DIAGNOSIS — I10 Essential (primary) hypertension: Secondary | ICD-10-CM | POA: Diagnosis present

## 2015-10-27 HISTORY — PX: ESOPHAGOGASTRODUODENOSCOPY: SHX5428

## 2015-10-27 LAB — BODY FLUID CELL COUNT WITH DIFFERENTIAL
EOS FL: 0 %
LYMPHS FL: 53 %
MONOCYTE-MACROPHAGE-SEROUS FLUID: 47 % — AB (ref 50–90)
Neutrophil Count, Fluid: 0 % (ref 0–25)
Other Cells, Fluid: 0 %
Total Nucleated Cell Count, Fluid: 118 cu mm (ref 0–1000)

## 2015-10-27 LAB — GRAM STAIN

## 2015-10-27 LAB — GLUCOSE, CAPILLARY
Glucose-Capillary: 65 mg/dL (ref 65–99)
Glucose-Capillary: 81 mg/dL (ref 65–99)
Glucose-Capillary: 150 mg/dL — ABNORMAL HIGH (ref 65–99)

## 2015-10-27 LAB — PROTEIN, BODY FLUID

## 2015-10-27 SURGERY — EGD (ESOPHAGOGASTRODUODENOSCOPY)
Anesthesia: Moderate Sedation

## 2015-10-27 MED ORDER — FENTANYL CITRATE (PF) 100 MCG/2ML IJ SOLN
INTRAMUSCULAR | Status: DC | PRN
  Administered 2015-10-27 (×2): 25 ug via INTRAVENOUS

## 2015-10-27 MED ORDER — SPIRONOLACTONE 50 MG PO TABS
50.0000 mg | ORAL_TABLET | Freq: Every day | ORAL | Status: DC
  Administered 2015-10-28: 50 mg via ORAL
  Filled 2015-10-27: qty 1

## 2015-10-27 MED ORDER — LIDOCAINE HCL (PF) 1 % IJ SOLN
INTRAMUSCULAR | Status: AC
  Filled 2015-10-27: qty 10

## 2015-10-27 MED ORDER — NADOLOL 40 MG PO TABS
40.0000 mg | ORAL_TABLET | Freq: Every day | ORAL | Status: DC
  Administered 2015-10-28 – 2015-10-29 (×2): 40 mg via ORAL
  Filled 2015-10-27 (×3): qty 1

## 2015-10-27 MED ORDER — BUTAMBEN-TETRACAINE-BENZOCAINE 2-2-14 % EX AERO
INHALATION_SPRAY | CUTANEOUS | Status: DC | PRN
  Administered 2015-10-27: 2 via TOPICAL

## 2015-10-27 MED ORDER — INSULIN ASPART 100 UNIT/ML ~~LOC~~ SOLN
0.0000 [IU] | Freq: Three times a day (TID) | SUBCUTANEOUS | Status: DC
  Administered 2015-10-28: 5 [IU] via SUBCUTANEOUS
  Administered 2015-10-28 (×2): 1 [IU] via SUBCUTANEOUS
  Administered 2015-10-29: 2 [IU] via SUBCUTANEOUS

## 2015-10-27 MED ORDER — MIDAZOLAM HCL 10 MG/2ML IJ SOLN
INTRAMUSCULAR | Status: DC | PRN
  Administered 2015-10-27: 1 mg via INTRAVENOUS
  Administered 2015-10-27: 2 mg via INTRAVENOUS
  Administered 2015-10-27: 1 mg via INTRAVENOUS

## 2015-10-27 MED ORDER — FUROSEMIDE 10 MG/ML IJ SOLN
20.0000 mg | Freq: Every day | INTRAMUSCULAR | Status: DC
  Administered 2015-10-28: 20 mg via INTRAVENOUS
  Filled 2015-10-27: qty 2

## 2015-10-27 MED ORDER — MIDAZOLAM HCL 5 MG/ML IJ SOLN
INTRAMUSCULAR | Status: AC
Start: 2015-10-27 — End: 2015-10-27
  Filled 2015-10-27: qty 1

## 2015-10-27 MED ORDER — FENTANYL CITRATE (PF) 100 MCG/2ML IJ SOLN
INTRAMUSCULAR | Status: AC
  Filled 2015-10-27: qty 2

## 2015-10-27 NOTE — Progress Notes (Signed)
[place] phone line used to communicate with patient regarding assessment, medications, EGD procedure later this afternoon, and plan of care for the day. Answered all questions.   Joellen Jersey, RN.

## 2015-10-27 NOTE — Op Note (Signed)
[facility] Register, [id number]   ENDOSCOPY PROCEDURE REPORT  PATIENT: Christian Wallace, Christian Wallace  MR#: [id number] BIRTHDATE: [birth year] , 8  yrs. old GENDER: male ENDOSCOPIST: Gatha Mayer, MD, [facility] PROCEDURE DATE:  10/27/2015 PROCEDURE:  EGD, diagnostic ASA CLASS:     Class III INDICATIONS:  varices seen on CT, ? of bleeding. MEDICATIONS: Fentanyl 50 mcg IV and Versed 4 mg IV TOPICAL ANESTHETIC: Cetacaine Spray  DESCRIPTION OF PROCEDURE: After the risks benefits and alternatives of the procedure were thoroughly explained, informed consent was obtained.  The Pentax Gastroscope [id number] endoscope was introduced through the mouth and advanced to the second portion of the duodenum , Without limitations.  The instrument was slowly withdrawn as the mucosa was fully examined.    1) 3 columns medium varices with red nipples - mid to distal esophagus 2) Otherwise normal EGD 3) Given size and red nipples and his overall status - thought band ligation a reasonable option - however could not get banding device through the UES despite several attempts and changes in position.  Retroflexed views revealed no abnormalities.     The scope was then withdrawn from the patient and the procedure completed.  COMPLICATIONS: There were no immediate complications.  ENDOSCOPIC IMPRESSION: 1) 3 columns medium varices with red nipples - mid to distal esophagus 2) Otherwise normal EGD 3) Given size and red nipples and his overall status - thought band ligation a reasonable option - however could not get banding device through the UES despite several attempts and changes in position  RECOMMENDATIONS: 1.  Furosemide and spironolactone ordered for AM start Nadolol 40 mg qd in AM stop ceftriazone - no SBP on paracentesis and no obvious bleeding from the varices feed he will need outpatient f/u for management of decompensated cirrhosis - can see APP in our office and be  assigned to one of our MD's also at that time - likely Dr.  Havery Moros 2.  Should he have proven bleeding from the varices could retry banding but may need sclerosis vs TIPS (and octreotide)   eSigned:  Gatha Mayer, MD, [facility] 10/27/2015 4:09 PM

## 2015-10-27 NOTE — Progress Notes (Signed)
[facility] PROGRESS NOTE  Christian Wallace YT:[id number] DOB: 03/17/1941 DOA: 10/25/2015 PCP: No primary care provider on file.  Assessment/Plan: 1. 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 Esophageal, gastric varices and portal hypertension  -? question of esophageal variceal bleeding on CT scan  3. Ascites.  -US guided paracentesis, r/o SBP  4. DM 2 -stable, hold metformin, SSI  5. 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 -monitor  DVT proph: SCDs  Code Status: Full Code Family Communication: none at bedside Disposition Plan: home when improved   Consultants:  GI  HPI/Subjective: Complains of some Abd discomfort  Objective: Filed Vitals:   10/27/15 1208 10/27/15 1403  BP: 171/66 144/108  Pulse:  66  Temp:  97.8 F (36.6 C)  Resp:  21    Intake/Output Summary (Last 24 hours) at 10/27/15 1514 Last data filed at 10/27/15 1400  Gross per 24 hour  Intake    200 ml  Output      0 ml  Net    200 ml   Filed Weights   10/25/15 1707 10/26/15 0418 10/26/15 2025  Weight: 57.21 kg (126 lb 2 oz) 57.063 kg (125 lb 12.8 oz) 57.607 kg (127 lb)    Exam:   General:  AAOx3  Cardiovascular: S1S2/RRR  Respiratory: CTAB  Abdomen: soft, NT, Distended, BS present  Musculoskeletal:  No edema c/c  Data Reviewed: Basic Metabolic Panel:  Recent Labs Lab 10/25/15 1722 10/26/15 0306  NA 138 137  K 4.1 4.1  CL 106 108  CO2 23 24  GLUCOSE 186* 116*  BUN 19 19  CREATININE 1.48* 1.22  CALCIUM 8.8* 7.9*   Liver Function Tests:  Recent Labs Lab 10/25/15 1722  AST 54*  ALT 38  ALKPHOS 238*  BILITOT 0.8  PROT 7.7  ALBUMIN 2.2*    Recent Labs Lab 10/25/15 1722  LIPASE 37   No results for input(s): AMMONIA in the last 168 hours. CBC:  Recent Labs Lab 10/25/15 1722 10/26/15 0306  WBC 4.4 3.5*  HGB 13.5 11.1*  HCT 40.1 34.0*  MCV 92.6 91.4  PLT 115* 93*   Cardiac  Enzymes: No results for input(s): CKTOTAL, CKMB, CKMBINDEX, TROPONINI in the last 168 hours. BNP (last 3 results) No results for input(s): BNP in the last 8760 hours.  ProBNP (last 3 results) No results for input(s): PROBNP in the last 8760 hours.  CBG:  Recent Labs Lab 10/26/15 0414  GLUCAP 109*    No results found for this or any previous visit (from the past 240 hour(s)).   Studies: Ct Abdomen Pelvis W Contrast  10/26/2015  CLINICAL DATA:  74 year old male with abdominal distention x5 6 days. Diffuse abdominal pain. EXAM: CT ABDOMEN AND PELVIS WITH CONTRAST TECHNIQUE: Multidetector CT imaging of the abdomen and pelvis was performed using the standard protocol following bolus administration of intravenous contrast. CONTRAST:  68mL OMNIPAQUE IOHEXOL 300 MG/ML  SOLN COMPARISON:  Abdominal CT dated 05/28/2012 and chest CT dated 11/27/2013 FINDINGS: There bibasilar dependent atelectatic changes/scarring. Stable mild cardiomegaly. There is coronary vascular calcification. No intra-abdominal free air. Moderate diffuse ascites, new from prior study. There are morphologic changes of cirrhosis with surface irregularity of the liver. Multiple gallstones. Evaluation of the gallbladder is limited due to ascites. The pancreas, spleen, and adrenal glands appear unremarkable. The kidneys, visualized ureters, and urinary bladder appear unremarkable. The prostate gland and seminal vesicles are grossly unremarkable.  There is a focal area of contrast collection at the gastroesophageal junction extending into the distal esophagus compatible with variceal bleed. There is no evidence of bowel obstruction. There is apparent thickening of the ascending colon, likely related to underdistention and secondary to hepatic colopathy. Colitis is less likely but not excluded. Clinical correlation is recommended. The appendix appears unremarkable. Aortoiliac atherosclerotic disease. The origins of the celiac axis, SMA, IMA as  well as the origins of the renal arteries are patent. No portal venous gas identified. Paraesophageal and gastric varices noted. There is no lymphadenopathy. There is multilevel degenerative changes of the spine. No acute fracture. There is diffuse subcutaneous soft tissue stranding and anasarca. IMPRESSION: Cirrhosis with evidence of portal hypertension and paraesophageal and gastric versus. Contrast noted at the distal esophagus and gastroesophageal junction compatible with variceal bleed. Moderate ascites, new from prior study. Apparent thickening of the ascending colon likely related to cirrhosis and ascites and less likely colitis. Clinical correlation is recommended. No bowel obstruction. Cholelithiasis. Electronically Signed   By: Anner Crete M.D.   On: 10/26/2015 00:47   US Paracentesis  10/27/2015  CLINICAL DATA:  Abdominal discomfort, distention. Request for diagnostic and therapeutic paracentesis of ascites, up to 4 L max. EXAM: ULTRASOUND GUIDED PARACENTESIS COMPARISON:  None. PROCEDURE: An ultrasound guided paracentesis was thoroughly discussed with the patient and questions answered. The benefits, risks, alternatives and complications were also discussed. The patient understands and wishes to proceed with the procedure. Written consent was obtained. Ultrasound was performed to localize and mark an adequate pocket of fluid in the right lower quadrant of the abdomen. The area was then prepped and draped in the normal sterile fashion. 1% Lidocaine was used for local anesthesia. Under ultrasound guidance a 19 gauge Yueh catheter was introduced. Paracentesis was performed. The catheter was removed and a dressing applied. COMPLICATIONS: None immediate FINDINGS: A total of approximately 4 L of clear yellow fluid was removed. A fluid sample was sent for laboratory analysis. IMPRESSION: Successful ultrasound guided paracentesis yielding 4 L of ascites. Read by: Ascencion Dike PA-C Electronically Signed    By: Jacqulynn Cadet M.D.   On: 10/27/2015 12:41    Scheduled Meds: . [MAR Hold] cefTRIAXone (ROCEPHIN)  IV  1 g Intravenous Q24H  . [MAR Hold] feeding supplement  1 Container Oral TID BM  . [MAR Hold] lisinopril  20 mg Oral Daily   And  . [MAR Hold] hydrochlorothiazide  25 mg Oral Daily  . lidocaine (PF)      . [MAR Hold] metFORMIN  1,000 mg Oral BID WC  . [MAR Hold] simvastatin  40 mg Oral q1800   Continuous Infusions: . sodium chloride 20 mL/hr at 10/26/15 2000   Antibiotics Given (last 72 hours)    Date/Time Action Medication Dose Rate   10/26/15 1646 Given   [MAR Hold] cefTRIAXone (ROCEPHIN) 1 g in dextrose 5 % 50 mL IVPB (MAR Hold since 10/27/15 1354) 1 g 100 mL/hr      Active Problems:   Cirrhosis of liver with ascites ([place])   Malnutrition of moderate degree    Time spent: 38min    Kameelah Minish  [facility] Pager [phone]. If 7PM-7AM, please contact night-coverage at [email or web address], password [email or web address] 10/27/2015, 3:14 PM  LOS: 0 days

## 2015-10-27 NOTE — Procedures (Signed)
Successful US guided paracentesis from RLQ.  Yielded 4L of clear yellow fluid.  No immediate complications.  Pt tolerated well.   Specimen was sent for labs.  Ascencion Dike PA-C 10/27/2015 12:41 PM

## 2015-10-28 ENCOUNTER — Encounter (HOSPITAL_COMMUNITY): Payer: Self-pay | Admitting: Internal Medicine

## 2015-10-28 ENCOUNTER — Other Ambulatory Visit: Payer: Self-pay | Admitting: Physician Assistant

## 2015-10-28 DIAGNOSIS — R188 Other ascites: Secondary | ICD-10-CM

## 2015-10-28 DIAGNOSIS — D500 Iron deficiency anemia secondary to blood loss (chronic): Secondary | ICD-10-CM

## 2015-10-28 DIAGNOSIS — K7469 Other cirrhosis of liver: Secondary | ICD-10-CM

## 2015-10-28 DIAGNOSIS — K7460 Unspecified cirrhosis of liver: Secondary | ICD-10-CM

## 2015-10-28 LAB — CBC
HCT: 32.6 % — ABNORMAL LOW (ref 39.0–52.0)
Hemoglobin: 11.3 g/dL — ABNORMAL LOW (ref 13.0–17.0)
MCH: 31.6 pg (ref 26.0–34.0)
MCHC: 34.7 g/dL (ref 30.0–36.0)
MCV: 91.1 fL (ref 78.0–100.0)
PLATELETS: 84 10*3/uL — AB (ref 150–400)
RBC: 3.58 MIL/uL — AB (ref 4.22–5.81)
RDW: 14.6 % (ref 11.5–15.5)
WBC: 4.4 10*3/uL (ref 4.0–10.5)

## 2015-10-28 LAB — GLUCOSE, CAPILLARY
GLUCOSE-CAPILLARY: 280 mg/dL — AB (ref 65–99)
Glucose-Capillary: 138 mg/dL — ABNORMAL HIGH (ref 65–99)
Glucose-Capillary: 146 mg/dL — ABNORMAL HIGH (ref 65–99)
Glucose-Capillary: 175 mg/dL — ABNORMAL HIGH (ref 65–99)

## 2015-10-28 LAB — COMPREHENSIVE METABOLIC PANEL
ALT: 22 U/L (ref 17–63)
ANION GAP: 5 (ref 5–15)
AST: 30 U/L (ref 15–41)
Albumin: 1.6 g/dL — ABNORMAL LOW (ref 3.5–5.0)
Alkaline Phosphatase: 156 U/L — ABNORMAL HIGH (ref 38–126)
BUN: 19 mg/dL (ref 6–20)
CHLORIDE: 108 mmol/L (ref 101–111)
CO2: 22 mmol/L (ref 22–32)
CREATININE: 1.36 mg/dL — AB (ref 0.61–1.24)
Calcium: 7.8 mg/dL — ABNORMAL LOW (ref 8.9–10.3)
GFR, EST AFRICAN AMERICAN: 57 mL/min — AB (ref >60)
GFR, EST NON AFRICAN AMERICAN: 49 mL/min — AB (ref >60)
Glucose, Bld: 150 mg/dL — ABNORMAL HIGH (ref 65–99)
Potassium: 4.1 mmol/L (ref 3.5–5.1)
SODIUM: 135 mmol/L (ref 135–145)
Total Bilirubin: 0.8 mg/dL (ref 0.3–1.2)
Total Protein: 5.8 g/dL — ABNORMAL LOW (ref 6.5–8.1)

## 2015-10-28 MED ORDER — FUROSEMIDE 40 MG PO TABS: 40.0000 mg | ORAL_TABLET | Freq: Every day | ORAL | Status: DC

## 2015-10-28 MED ORDER — SPIRONOLACTONE 50 MG PO TABS
100.0000 mg | ORAL_TABLET | Freq: Every day | ORAL | Status: DC
  Administered 2015-10-29: 100 mg via ORAL
  Filled 2015-10-28: qty 2

## 2015-10-28 MED ORDER — FUROSEMIDE 20 MG PO TABS
20.0000 mg | ORAL_TABLET | Freq: Once | ORAL | Status: AC
Start: 2015-10-28 — End: 2015-10-28
  Administered 2015-10-28: 20 mg via ORAL
  Filled 2015-10-28: qty 1

## 2015-10-28 MED ORDER — SPIRONOLACTONE 50 MG PO TABS
50.0000 mg | ORAL_TABLET | Freq: Once | ORAL | Status: AC
  Administered 2015-10-28: 50 mg via ORAL
  Filled 2015-10-28: qty 1

## 2015-10-28 NOTE — Progress Notes (Signed)
[facility] PROGRESS NOTE  Christian Wallace YT:[id number] DOB: 06-21-1941 DOA: 10/25/2015 PCP: No primary care provider on file.  Assessment/Plan: 1. Cirrhosis of liver -h/o ETOH abuse, Hep C Ab +, Hep B surface Ab and Core Ab -quit ETOH >2years ago  2. Esophageal, gastric varices and portal hypertension  - grade 3 varices on EGD without active bleeding, couldn't be banded -PPI, nadolol, FU with GI  3. Ascites.  -s/p US guided paracentesis, no SBP -stopped Abx, continue lasix and aldactone  4. DM 2 -stable, hold metformin, SSI  5. 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 -monitor  DVT proph: SCDs  Code Status: Full Code Family Communication: wife at bedside,, called and updated daughter Disposition Plan: home tomorrow   Consultants:  GI  HPI/Subjective: Complains of some Abd discomfort but improved  Objective: Filed Vitals:   10/28/15 1000 10/28/15 1342  BP: 146/70 154/93  Pulse: 57 57  Temp: 98 F (36.7 C) 98.6 F (37 C)  Resp: 16 16    Intake/Output Summary (Last 24 hours) at 10/28/15 1415 Last data filed at 10/28/15 1342  Gross per 24 hour  Intake    900 ml  Output   1350 ml  Net   -450 ml   Filed Weights   10/26/15 0418 10/26/15 2025 10/27/15 2100  Weight: 57.063 kg (125 lb 12.8 oz) 57.607 kg (127 lb) 58.1 kg (128 lb 1.4 oz)    Exam:   General:  AAOx3  Cardiovascular: S1S2/RRR  Respiratory: CTAB  Abdomen: soft, NT, Distended, BS present  Musculoskeletal:  No edema c/c  Data Reviewed: Basic Metabolic Panel:  Recent Labs Lab 10/25/15 1722 10/26/15 0306 10/28/15 0705  NA 138 137 135  K 4.1 4.1 4.1  CL 106 108 108  CO2 23 24 22   GLUCOSE 186* 116* 150*  BUN 19 19 19   CREATININE 1.48* 1.22 1.36*  CALCIUM 8.8* 7.9* 7.8*   Liver Function Tests:  Recent Labs Lab 10/25/15 1722 10/28/15 0705  AST 54* 30  ALT 38 22  ALKPHOS 238* 156*  BILITOT 0.8 0.8  PROT 7.7 5.8*  ALBUMIN 2.2* 1.6*    Recent Labs Lab  10/25/15 1722  LIPASE 37   No results for input(s): AMMONIA in the last 168 hours. CBC:  Recent Labs Lab 10/25/15 1722 10/26/15 0306 10/28/15 0705  WBC 4.4 3.5* 4.4  HGB 13.5 11.1* 11.3*  HCT 40.1 34.0* 32.6*  MCV 92.6 91.4 91.1  PLT 115* 93* 84*   Cardiac Enzymes: No results for input(s): CKTOTAL, CKMB, CKMBINDEX, TROPONINI in the last 168 hours. BNP (last 3 results) No results for input(s): BNP in the last 8760 hours.  ProBNP (last 3 results) No results for input(s): PROBNP in the last 8760 hours.  CBG:  Recent Labs Lab 10/27/15 1736 10/27/15 1820 10/27/15 2126 10/28/15 0740 10/28/15 1150  GLUCAP 65 81 150* 138* 280*    Recent Results (from the past 240 hour(s))  Culture, body fluid-bottle     Status: None (Preliminary result)   Collection Time: 10/27/15 11:48 AM  Result Value Ref Range Status   Specimen Description FLUID PERITONEAL  Final   Special Requests BOTTLES DRAWN AEROBIC AND ANAEROBIC 10CC  Final   Culture NO GROWTH < 24 HOURS  Final   Report Status PENDING  Incomplete  Gram stain     Status: None   Collection Time: 10/27/15 11:48 AM  Result Value Ref Range Status   Specimen Description FLUID PERITONEAL  Final   Special Requests BOTTLES DRAWN AEROBIC  AND ANAEROBIC 10CC  Final   Gram Stain   Final    CYTOSPIN SMEAR WBC PRESENT, PREDOMINANTLY MONONUCLEAR NO ORGANISMS SEEN    Report Status 10/27/2015 FINAL  Final     Studies: US Paracentesis  10/27/2015  CLINICAL DATA:  Abdominal discomfort, distention. Request for diagnostic and therapeutic paracentesis of ascites, up to 4 L max. EXAM: ULTRASOUND GUIDED PARACENTESIS COMPARISON:  None. PROCEDURE: An ultrasound guided paracentesis was thoroughly discussed with the patient and questions answered. The benefits, risks, alternatives and complications were also discussed. The patient understands and wishes to proceed with the procedure. Written consent was obtained. Ultrasound was performed to localize  and mark an adequate pocket of fluid in the right lower quadrant of the abdomen. The area was then prepped and draped in the normal sterile fashion. 1% Lidocaine was used for local anesthesia. Under ultrasound guidance a 19 gauge Yueh catheter was introduced. Paracentesis was performed. The catheter was removed and a dressing applied. COMPLICATIONS: None immediate FINDINGS: A total of approximately 4 L of clear yellow fluid was removed. A fluid sample was sent for laboratory analysis. IMPRESSION: Successful ultrasound guided paracentesis yielding 4 L of ascites. Read by: Christian Dike PA-C Electronically Signed   By: Christian Wallace M.D.   On: 10/27/2015 12:41    Scheduled Meds: . feeding supplement  1 Container Oral TID BM  . [START ON 10/29/2015] furosemide  40 mg Oral Daily  . insulin aspart  0-9 Units Subcutaneous TID WC  . nadolol  40 mg Oral Daily  . simvastatin  40 mg Oral q1800  . [START ON 10/29/2015] spironolactone  100 mg Oral Daily   Continuous Infusions:   Antibiotics Given (last 72 hours)    Date/Time Action Medication Dose Rate   10/26/15 1646 Given   cefTRIAXone (ROCEPHIN) 1 g in dextrose 5 % 50 mL IVPB 1 g 100 mL/hr      Active Problems:   Cirrhosis of liver with ascites ([facility])   Malnutrition of moderate degree   Esophageal varices in alcoholic cirrhosis ([place])    Time spent: 33min    Christian Wallace  [facility] Pager [phone]. If 7PM-7AM, please contact night-coverage at [email or web address], password [facility] 10/28/2015, 2:15 PM  LOS: 1 day

## 2015-10-28 NOTE — Progress Notes (Signed)
Daily Rounding Note  10/28/2015, 9:39 AM  LOS: 1 day   SUBJECTIVE:       Last BM was 11/27 per RN.  No abdominal pain or nausea.  OBJECTIVE:         Vital signs in last 24 hours:    Temp:  [97.2 F (36.2 C)-99.1 F (37.3 C)] 99.1 F (37.3 C) (12/02 0500) Pulse Rate:  [66-80] 72 (12/02 0500) Resp:  [14-24] 14 (12/02 0500) BP: (103-180)/(51-108) 138/74 mmHg (12/02 0500) SpO2:  [97 %-100 %] 99 % (12/02 0500) Weight:  [58.1 kg (128 lb 1.4 oz)] 58.1 kg (128 lb 1.4 oz) (12/01 2100) Last BM Date: 10/23/15 Filed Weights   10/26/15 0418 10/26/15 2025 10/27/15 2100  Weight: 57.063 kg (125 lb 12.8 oz) 57.607 kg (127 lb) 58.1 kg (128 lb 1.4 oz)   General: pleasant, comfortable   Heart: RRR Chest: clear bil.  No cough or labored breathing Abdomen: soft, NT, ND.  Ascites much improved.   Extremities: improved LE edema Neuro/Psych:  Alert, oriented x 3.  No tremor or asterixis. Moving all limbs.    Intake/Output from previous day: 12/01 0701 - 12/02 0700 In: 620 [P.O.:280; I.V.:340] Out: 350 [Urine:350]  Intake/Output this shift:    Lab Results:  Recent Labs  10/25/15 1722 10/26/15 0306 10/28/15 0705  WBC 4.4 3.5* 4.4  HGB 13.5 11.1* 11.3*  HCT 40.1 34.0* 32.6*  PLT 115* 93* 84*   BMET  Recent Labs  10/25/15 1722 10/26/15 0306 10/28/15 0705  NA 138 137 135  K 4.1 4.1 4.1  CL 106 108 108  CO2 23 24 22   GLUCOSE 186* 116* 150*  BUN 19 19 19   CREATININE 1.48* 1.22 1.36*  CALCIUM 8.8* 7.9* 7.8*   LFT  Recent Labs  10/25/15 1722 10/28/15 0705  PROT 7.7 5.8*  ALBUMIN 2.2* 1.6*  AST 54* 30  ALT 38 22  ALKPHOS 238* 156*  BILITOT 0.8 0.8   PT/INR  Recent Labs  10/26/15 0143  LABPROT 14.8  INR 1.14   Hepatitis Panel No results for input(s): HEPBSAG, HCVAB, HEPAIGM, HEPBIGM in the last 72 hours.  Studies/Results: US Paracentesis  10/27/2015  CLINICAL DATA:  Abdominal discomfort, distention.  Request for diagnostic and therapeutic paracentesis of ascites, up to 4 L max. EXAM: ULTRASOUND GUIDED PARACENTESIS COMPARISON:  None. PROCEDURE: An ultrasound guided paracentesis was thoroughly discussed with the patient and questions answered. The benefits, risks, alternatives and complications were also discussed. The patient understands and wishes to proceed with the procedure. Written consent was obtained. Ultrasound was performed to localize and mark an adequate pocket of fluid in the right lower quadrant of the abdomen. The area was then prepped and draped in the normal sterile fashion. 1% Lidocaine was used for local anesthesia. Under ultrasound guidance a 19 gauge Yueh catheter was introduced. Paracentesis was performed. The catheter was removed and a dressing applied. COMPLICATIONS: None immediate FINDINGS: A total of approximately 4 L of clear yellow fluid was removed. A fluid sample was sent for laboratory analysis. IMPRESSION: Successful ultrasound guided paracentesis yielding 4 L of ascites. Read by: Ascencion Dike PA-C Electronically Signed   By: Jacqulynn Cadet M.D.   On: 10/27/2015 12:41   Scheduled Meds: . feeding supplement  1 Container Oral TID BM  . furosemide  20 mg Intravenous Daily  . lisinopril  20 mg Oral Daily   And  . hydrochlorothiazide  25 mg Oral Daily  .  insulin aspart  0-9 Units Subcutaneous TID WC  . nadolol  40 mg Oral Daily  . simvastatin  40 mg Oral q1800  . spironolactone  50 mg Oral Daily   Continuous Infusions:  PRN Meds:.   ASSESMENT:   * Cirrhosis of the liver. History EtOH abuse, in remission. Hep C Ab +, Hep B surface Ab and core Ab +, Hep B Surface Ag -   * Esophageal, gastric varices and portal hypertension as well as question of esophageal variceal bleeding on CT scan. EGD 12/1: 3 columns of medium varices with red nipples, mid to distal esophagus.  Unable to pass band ligation device beyond upper esoph sphincter, Osaze not banded.    Nadolol day  1.   * Ascites.  10/27/15: 4 liter paracentesis, no signs of SBP.   Low dose Aldactone and IV Lasix.   Weight down ~ 1 kg.   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  * Type II DM. Managed with oral meds.  * Significant language barrier, patient only speaks [place].     PLAN   *  Up dosing Aldactone to 100 mg daily and change Lasix to 40 mg daily.  BMET in AM.    *  Discharge tomorrow if labs ok.  2 gm Na diet.   *  Daily weight.  *  Has appt in GI office with PA on 12/8 and with Dr Havery Moros on 2/7. [place] lab visit for CBC and CMET 12/7.   Entered into discharge plans.     Azucena Freed  10/28/2015, 9:39 AM Pager: [phone]     Attending physician's note   I have taken an interval history, reviewed the chart and examined the patient. I agree with the Advanced Practitioner's note, impression and recommendations. Cirrhosis with varices and ascites. Started Nadolol 40 mg daily. Agree with Aldactone 100 mg daily, Laisx 40 mg daily and 2 gm Na diet. Monitor BMET and daily weights while in hospital. Outpatient GI follow up as above. GI signing off.   Lucio Edward, MD [facility] [phone] Mon-Fri 8a-5p [phone] after 5p, weekends, holidays

## 2015-10-29 LAB — BASIC METABOLIC PANEL
Anion gap: 4 — ABNORMAL LOW (ref 5–15)
BUN: 23 mg/dL — ABNORMAL HIGH (ref 6–20)
CHLORIDE: 104 mmol/L (ref 101–111)
CO2: 26 mmol/L (ref 22–32)
Calcium: 7.8 mg/dL — ABNORMAL LOW (ref 8.9–10.3)
Creatinine, Ser: 1.56 mg/dL — ABNORMAL HIGH (ref 0.61–1.24)
GFR calc non Af Amer: 41 mL/min — ABNORMAL LOW (ref >60)
GFR, EST AFRICAN AMERICAN: 48 mL/min — AB (ref >60)
Glucose, Bld: 198 mg/dL — ABNORMAL HIGH (ref 65–99)
POTASSIUM: 4.2 mmol/L (ref 3.5–5.1)
Sodium: 134 mmol/L — ABNORMAL LOW (ref 135–145)

## 2015-10-29 LAB — GLUCOSE, CAPILLARY
GLUCOSE-CAPILLARY: 167 mg/dL — AB (ref 65–99)
GLUCOSE-CAPILLARY: 283 mg/dL — AB (ref 65–99)

## 2015-10-29 MED ORDER — FUROSEMIDE 20 MG PO TABS
20.0000 mg | ORAL_TABLET | Freq: Every day | ORAL | Status: DC
Start: 2015-10-29 — End: 2015-10-29
  Administered 2015-10-29: 20 mg via ORAL
  Filled 2015-10-29: qty 1

## 2015-10-29 MED ORDER — SPIRONOLACTONE 50 MG PO TABS: 50.0000 mg | ORAL_TABLET | Freq: Every day | ORAL | Status: DC

## 2015-10-29 MED ORDER — NADOLOL 40 MG PO TABS: 40.0000 mg | ORAL_TABLET | Freq: Every day | ORAL | Status: DC

## 2015-10-29 MED ORDER — FUROSEMIDE 20 MG PO TABS: 20.0000 mg | ORAL_TABLET | Freq: Every day | ORAL | Status: DC

## 2015-10-29 NOTE — Progress Notes (Signed)
Patient discharged papers printed on both [place] and [place] language.

## 2015-10-29 NOTE — Progress Notes (Signed)
Patient discharged on stable condition.No open skin issues,no comlaint.Discharged papers and instructions given to patient's son who speaks fluent [place].Questions were answered satisfactorily at the time of discharged.

## 2015-10-29 NOTE — H&P (Signed)
Physician Discharge Summary  Christian Wallace [id number] DOB: 10-31-1941 DOA: 10/25/2015  PCP: No primary care provider on file.  Admit date: 10/25/2015 Discharge date: 10/29/2015  Time spent: 45 minutes  Recommendations for Outpatient Follow-up:  [facility]  Follow up with Hvozdovic, Vita Barley, PA-C On 11/03/2015, please check Bmet at FU Needs to be evaluated for Hepatitis Rx  Discharge Diagnoses:  Active Problems:   Cirrhosis of liver with ascites (HCC)   Malnutrition of moderate degree   Esophageal varices in alcoholic cirrhosis (HCC)   Ascites   Hepatitis B core Ab positive   Hepatitis C  Discharge Condition: stable  Diet recommendation: low sodium  Filed Weights   10/27/15 2100 10/28/15 2100 10/29/15 0500  Weight: 58.1 kg (128 lb 1.4 oz) 55.566 kg (122 lb 8 oz) 55.56 kg (122 lb 7.8 oz)    History of present illness:  Chief Complaint: Abdominal pain and distention  HPI: Christian Wallace is a 74 y.o. male with remote history of EtOH abuse in the past. Patient presented to the ED with abd pain and distention for 5 days. Last BM 3 days ago, but this is normal for the patient. Patient referred to ED from [facility] for possible CT scan. CT scan showed cirrhosis of the liver, ascites, and they even suggested an esophageal variceal bleed based on CT scan.  Hospital Course:  1. Cirrhosis of liver -h/o ETOH abuse, Hep C Ab +, Hep B surface Ab and Core Ab -quit ETOH >2years ago -advised to FU to seek further management Rx for Hepatitis C  2. Esophageal, gastric varices and portal hypertension  - grade 3 varices on EGD without active bleeding, couldn't be banded - was on PPI,started on  Nadolol, - FU with GI, recommended repeat EGD in 1-2 months  3. Ascites.  -s/p US guided paracentesis, no SBP -stopped Abx, started on lasix and aldactone -dose increased resulted in mild bump in creatinine , hence cut back dose to lasix 20mg  and aldactone 50mg   4. DM 2 -stable, resumed metformin  5.  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 -stable   Procedures: Dr.Gessner 12/1: EGD: ENDOSCOPIC IMPRESSION: 1) 3 columns medium varices with red nipples - mid to distal esophagus 2) Otherwise normal EGD 3) Given size and red nipples and his overall status - band ligation was felt to be a reasonable option - however could not get banding device  through the UES despite several attempts and changes in position  Consultations: [facility] Discharge Exam: Filed Vitals:   10/29/15 0500 10/29/15 0811  BP: 131/68 145/73  Pulse: 56 57  Temp: 98.4 F (36.9 C) 98.2 F (36.8 C)  Resp: 14 14    General: AAOx3 Cardiovascular: S1S2/RRR Respiratory: CTAB  Discharge Instructions   Discharge Instructions    Diet - low sodium heart healthy    Complete by:  As directed      Increase activity slowly    Complete by:  As directed           Current Discharge Medication List    START taking these medications   Details  furosemide (LASIX) 20 MG tablet Take 1 tablet (20 mg total) by mouth daily. Qty: 30 tablet, Refills: 0    nadolol (CORGARD) 40 MG tablet Take 1 tablet (40 mg total) by mouth daily. Qty: 30 tablet, Refills: 0    spironolactone (ALDACTONE) 50 MG tablet Take 1 tablet (50 mg total) by mouth daily. Qty: 30 tablet, Refills: 0      CONTINUE  these medications which have NOT CHANGED   Details  metFORMIN (GLUCOPHAGE) 1000 MG tablet Take 1,000 mg by mouth 2 (two) times daily.    simvastatin (ZOCOR) 40 MG tablet Take 40 mg by mouth daily.       STOP taking these medications     lisinopril-hydrochlorothiazide (PRINZIDE,ZESTORETIC) 20-25 MG per tablet        No Known Allergies Follow-up Information    Follow up with Hvozdovic, Vita Barley, PA-C On 11/03/2015.   Specialty:  Gastroenterology   Why:  at 5.  follow up with Liver doctor's assistant.    Contact information:   [place] [id number] [phone]       Follow up with [facility] On 11/02/2015.   Why:  go to lab, in basement, on 12/7 for blood work.  anytime between 8 AM and 5 PM.     Contact information:   [place] [phone]       Follow up with Manus Gunning, MD On 01/03/2016.   Specialty:  Gastroenterology   Why:  10 AM.   to meet with liver doctor.    Contact information:   [place] [address] [phone]        The results of significant diagnostics from this hospitalization (including imaging, microbiology, ancillary and laboratory) are listed below for reference.    Significant Diagnostic Studies: Ct Abdomen Pelvis W Contrast  10/26/2015  CLINICAL DATA:  74 year old male with abdominal distention x5 6 days. Diffuse abdominal pain. EXAM: CT ABDOMEN AND PELVIS WITH CONTRAST TECHNIQUE: Multidetector CT imaging of the abdomen and pelvis was performed using the standard protocol following bolus administration of intravenous contrast. CONTRAST:  55mL OMNIPAQUE IOHEXOL 300 MG/ML  SOLN COMPARISON:  Abdominal CT dated 05/28/2012 and chest CT dated 11/27/2013 FINDINGS: There bibasilar dependent atelectatic changes/scarring. Stable mild cardiomegaly. There is coronary vascular calcification. No intra-abdominal free air. Moderate diffuse ascites, new from prior study. There are morphologic changes of cirrhosis with surface irregularity of the liver. Multiple gallstones. Evaluation of the gallbladder is limited due to ascites. The pancreas, spleen, and adrenal glands appear unremarkable. The kidneys, visualized ureters, and urinary bladder appear unremarkable. The prostate gland and seminal vesicles are grossly unremarkable. There is a focal area of contrast collection at the gastroesophageal junction extending into the distal esophagus compatible with variceal bleed. There is no evidence of bowel obstruction. There is apparent thickening of the ascending colon, likely related to  underdistention and secondary to hepatic colopathy. Colitis is less likely but not excluded. Clinical correlation is recommended. The appendix appears unremarkable. Aortoiliac atherosclerotic disease. The origins of the celiac axis, SMA, IMA as well as the origins of the renal arteries are patent. No portal venous gas identified. Paraesophageal and gastric varices noted. There is no lymphadenopathy. There is multilevel degenerative changes of the spine. No acute fracture. There is diffuse subcutaneous soft tissue stranding and anasarca. IMPRESSION: Cirrhosis with evidence of portal hypertension and paraesophageal and gastric versus. Contrast noted at the distal esophagus and gastroesophageal junction compatible with variceal bleed. Moderate ascites, new from prior study. Apparent thickening of the ascending colon likely related to cirrhosis and ascites and less likely colitis. Clinical correlation is recommended. No bowel obstruction. Cholelithiasis. Electronically Signed   By: Anner Crete M.D.   On: 10/26/2015 00:47   US Paracentesis  10/27/2015  CLINICAL DATA:  Abdominal discomfort, distention. Request for diagnostic and therapeutic paracentesis of  ascites, up to 4 L max. EXAM: ULTRASOUND GUIDED PARACENTESIS COMPARISON:  None. PROCEDURE: An ultrasound guided paracentesis was thoroughly discussed with the patient and questions answered. The benefits, risks, alternatives and complications were also discussed. The patient understands and wishes to proceed with the procedure. Written consent was obtained. Ultrasound was performed to localize and mark an adequate pocket of fluid in the right lower quadrant of the abdomen. The area was then prepped and draped in the normal sterile fashion. 1% Lidocaine was used for local anesthesia. Under ultrasound guidance a 19 gauge Yueh catheter was introduced. Paracentesis was performed. The catheter was removed and a dressing applied. COMPLICATIONS: None immediate  FINDINGS: A total of approximately 4 L of clear yellow fluid was removed. A fluid sample was sent for laboratory analysis. IMPRESSION: Successful ultrasound guided paracentesis yielding 4 L of ascites. Read by: Ascencion Dike PA-C Electronically Signed   By: Jacqulynn Cadet M.D.   On: 10/27/2015 12:41    Microbiology: Recent Results (from the past 240 hour(s))  Culture, body fluid-bottle     Status: None (Preliminary result)   Collection Time: 10/27/15 11:48 AM  Result Value Ref Range Status   Specimen Description FLUID PERITONEAL  Final   Special Requests BOTTLES DRAWN AEROBIC AND ANAEROBIC 10CC  Final   Culture NO GROWTH < 24 HOURS  Final   Report Status PENDING  Incomplete  Gram stain     Status: None   Collection Time: 10/27/15 11:48 AM  Result Value Ref Range Status   Specimen Description FLUID PERITONEAL  Final   Special Requests BOTTLES DRAWN AEROBIC AND ANAEROBIC 10CC  Final   Gram Stain   Final    CYTOSPIN SMEAR WBC PRESENT, PREDOMINANTLY MONONUCLEAR NO ORGANISMS SEEN    Report Status 10/27/2015 FINAL  Final     Labs: Basic Metabolic Panel:  Recent Labs Lab 10/25/15 1722 10/26/15 0306 10/28/15 0705 10/29/15 0336  NA 138 137 135 134*  K 4.1 4.1 4.1 4.2  CL 106 108 108 104  CO2 23 24 22 26   GLUCOSE 186* 116* 150* 198*  BUN 19 19 19  23*  CREATININE 1.48* 1.22 1.36* 1.56*  CALCIUM 8.8* 7.9* 7.8* 7.8*   Liver Function Tests:  Recent Labs Lab 10/25/15 1722 10/28/15 0705  AST 54* 30  ALT 38 22  ALKPHOS 238* 156*  BILITOT 0.8 0.8  PROT 7.7 5.8*  ALBUMIN 2.2* 1.6*    Recent Labs Lab 10/25/15 1722  LIPASE 37   No results for input(s): AMMONIA in the last 168 hours. CBC:  Recent Labs Lab 10/25/15 1722 10/26/15 0306 10/28/15 0705  WBC 4.4 3.5* 4.4  HGB 13.5 11.1* 11.3*  HCT 40.1 34.0* 32.6*  MCV 92.6 91.4 91.1  PLT 115* 93* 84*   Cardiac Enzymes: No results for input(s): CKTOTAL, CKMB, CKMBINDEX, TROPONINI in the last 168 hours. BNP: BNP  (last 3 results) No results for input(s): BNP in the last 8760 hours.  ProBNP (last 3 results) No results for input(s): PROBNP in the last 8760 hours.  CBG:  Recent Labs Lab 10/28/15 1150 10/28/15 1720 10/28/15 2137 10/29/15 0834 10/29/15 1124  GLUCAP 280* 146* 175* 167* 283*       Signed:  Lennan Malone  [facility] 10/29/2015, 11:45 AM

## 2015-10-29 NOTE — Progress Notes (Signed)
Papers prescriptions given to patient's son.

## 2015-10-29 NOTE — Progress Notes (Signed)
Patient's son reminded him of his father several medical appointments to attend to.

## 2015-10-31 NOTE — Discharge Summary (Addendum)
Physician Signed Internal Medicine H&P 10/29/2015 11:44 AM    Expand All Collapse All   Physician Discharge Summary  Christian Wallace [id number] DOB: 1941-09-28 DOA: 10/25/2015  PCP: No primary care provider on file.  Admit date: 10/25/2015 Discharge date: 10/29/2015  Time spent: 45 minutes  Recommendations for Outpatient Follow-up:  [facility] Follow up with Hvozdovic, Vita Barley, PA-C On 11/03/2015, please check Bmet at FU Needs to be evaluated for Hepatitis C Rx  Discharge Diagnoses:  Active Problems:  Cirrhosis of liver with ascites (HCC)  Malnutrition of moderate degree  Esophageal varices in alcoholic cirrhosis (HCC)  Ascites  Hepatitis B core Ab positive  Hepatitis C  Discharge Condition: stable  Diet recommendation: low sodium  Filed Weights   10/27/15 2100 10/28/15 2100 10/29/15 0500  Weight: 58.1 kg (128 lb 1.4 oz) 55.566 kg (122 lb 8 oz) 55.56 kg (122 lb 7.8 oz)    History of present illness:  Chief Complaint: Abdominal pain and distention  HPI: Christian Wallace is a 74 y.o. male with remote history of EtOH abuse in the past. Patient presented to the ED with abd pain and distention for 5 days. Last BM 3 days ago, but this is normal for the patient. Patient referred to ED from [facility] for possible CT scan. CT scan showed cirrhosis of the liver, ascites, and they even suggested an esophageal variceal bleed based on CT scan.  Hospital Course:  1. Cirrhosis of liver -h/o ETOH abuse, Hep C Ab +, Hep B surface Ab and Core Ab -quit ETOH >2years ago -advised to FU to seek further management Rx for Hepatitis C  2. Esophageal, gastric varices and portal hypertension  - grade 3 varices on EGD without active bleeding, couldn't be banded - was on PPI,started on Nadolol, - FU with GI, recommended repeat EGD in 1-2 months  3. Ascites.  -s/p US guided paracentesis, no SBP -stopped Abx, started on lasix and aldactone -dose increased resulted in mild bump in creatinine ,  hence cut back dose to lasix 20mg  and aldactone 50mg   4. DM 2 -stable, resumed metformin  5. 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 -stable   Procedures: Dr.Gessner 12/1: EGD: ENDOSCOPIC IMPRESSION: 1) 3 columns medium varices with red nipples - mid to distal esophagus 2) Otherwise normal EGD 3) Given size and red nipples and his overall status - band ligation was felt to be a reasonable option - however could not get banding device  through the UES despite several attempts and changes in position  Consultations: [facility] Discharge Exam: Filed Vitals:   10/29/15 0500 10/29/15 0811  BP: 131/68 145/73  Pulse: 56 57  Temp: 98.4 F (36.9 C) 98.2 F (36.8 C)  Resp: 14 14    General: AAOx3 Cardiovascular: S1S2/RRR Respiratory: CTAB  Discharge Instructions   Discharge Instructions    Diet - low sodium heart healthy  Complete by: As directed      Increase activity slowly  Complete by: As directed           Current Discharge Medication List    START taking these medications   Details  furosemide (LASIX) 20 MG tablet Take 1 tablet (20 mg total) by mouth daily. Qty: 30 tablet, Refills: 0    nadolol (CORGARD) 40 MG tablet Take 1 tablet (40 mg total) by mouth daily. Qty: 30 tablet, Refills: 0    spironolactone (ALDACTONE) 50 MG tablet Take 1 tablet (50 mg total) by mouth daily. Qty: 30 tablet, Refills: 0  CONTINUE these medications which have NOT CHANGED   Details  metFORMIN (GLUCOPHAGE) 1000 MG tablet Take 1,000 mg by mouth 2 (two) times daily.    simvastatin (ZOCOR) 40 MG tablet Take 40 mg by mouth daily.       STOP taking these medications     lisinopril-hydrochlorothiazide (PRINZIDE,ZESTORETIC) 20-25 MG per tablet        No Known Allergies Follow-up Information    Follow up with Hvozdovic, Vita Barley, PA-C On 11/03/2015.   Specialty: Gastroenterology   Why:  at 90. follow up with Liver doctor's assistant.    Contact information:   [place] [id number] [phone]       Follow up with [facility] On 11/02/2015.   Why: go to lab, in basement, on 12/7 for blood work. anytime between 8 AM and 5 PM.    Contact information:   [place] [phone]       Follow up with Manus Gunning, MD On 01/03/2016.   Specialty: Gastroenterology   Why: 10 AM. to meet with liver doctor.    Contact information:   [place]  [address] [phone]         The results of significant diagnostics from this hospitalization (including imaging, microbiology, ancillary and laboratory) are listed below for reference.    Significant Diagnostic Studies:  Imaging Results    Ct Abdomen Pelvis W Contrast  10/26/2015 CLINICAL DATA: 74 year old male with abdominal distention x5 6 days. Diffuse abdominal pain. EXAM: CT ABDOMEN AND PELVIS WITH CONTRAST TECHNIQUE: Multidetector CT imaging of the abdomen and pelvis was performed using the standard protocol following bolus administration of intravenous contrast. CONTRAST: 33mL OMNIPAQUE IOHEXOL 300 MG/ML SOLN COMPARISON: Abdominal CT dated 05/28/2012 and chest CT dated 11/27/2013 FINDINGS: There bibasilar dependent atelectatic changes/scarring. Stable mild cardiomegaly. There is coronary vascular calcification. No intra-abdominal free air. Moderate diffuse ascites, new from prior study. There are morphologic changes of cirrhosis with surface irregularity of the liver. Multiple gallstones. Evaluation of the gallbladder is limited due to ascites. The pancreas, spleen, and adrenal glands appear unremarkable. The kidneys, visualized ureters, and urinary bladder appear unremarkable. The prostate gland and seminal vesicles are grossly unremarkable. There is a focal area of contrast collection at the  gastroesophageal junction extending into the distal esophagus compatible with variceal bleed. There is no evidence of bowel obstruction. There is apparent thickening of the ascending colon, likely related to underdistention and secondary to hepatic colopathy. Colitis is less likely but not excluded. Clinical correlation is recommended. The appendix appears unremarkable. Aortoiliac atherosclerotic disease. The origins of the celiac axis, SMA, IMA as well as the origins of the renal arteries are patent. No portal venous gas identified. Paraesophageal and gastric varices noted. There is no lymphadenopathy. There is multilevel degenerative changes of the spine. No acute fracture. There is diffuse subcutaneous soft tissue stranding and anasarca. IMPRESSION: Cirrhosis with evidence of portal hypertension and paraesophageal and gastric versus. Contrast noted at the distal esophagus and gastroesophageal junction compatible with variceal bleed. Moderate ascites, new from prior study. Apparent thickening of the ascending colon likely related to cirrhosis and ascites and less likely colitis. Clinical correlation is recommended. No bowel obstruction. Cholelithiasis. Electronically Signed By: Anner Crete M.D. On: 10/26/2015 00:47   US Paracentesis  10/27/2015 CLINICAL DATA: Abdominal discomfort, distention. Request for diagnostic and therapeutic paracentesis of ascites, up to 4 L max. EXAM: ULTRASOUND GUIDED PARACENTESIS COMPARISON: None. PROCEDURE: An  ultrasound guided paracentesis was thoroughly discussed with the patient and questions answered. The benefits, risks, alternatives and complications were also discussed. The patient understands and wishes to proceed with the procedure. Written consent was obtained. Ultrasound was performed to localize and mark an adequate pocket of fluid in the right lower quadrant of the abdomen. The area was then prepped and draped in the normal sterile fashion. 1% Lidocaine was  used for local anesthesia. Under ultrasound guidance a 19 gauge Yueh catheter was introduced. Paracentesis was performed. The catheter was removed and a dressing applied. COMPLICATIONS: None immediate FINDINGS: A total of approximately 4 L of clear yellow fluid was removed. A fluid sample was sent for laboratory analysis. IMPRESSION: Successful ultrasound guided paracentesis yielding 4 L of ascites. Read by: Ascencion Dike PA-C Electronically Signed By: Jacqulynn Cadet M.D. On: 10/27/2015 12:41     Microbiology: Recent Results (from the past 240 hour(s))  Culture, body fluid-bottle Status: None (Preliminary result)   Collection Time: 10/27/15 11:48 AM  Result Value Ref Range Status   Specimen Description FLUID PERITONEAL  Final   Special Requests BOTTLES DRAWN AEROBIC AND ANAEROBIC 10CC  Final   Culture NO GROWTH < 24 HOURS  Final   Report Status PENDING  Incomplete  Gram stain Status: None   Collection Time: 10/27/15 11:48 AM  Result Value Ref Range Status   Specimen Description FLUID PERITONEAL  Final   Special Requests BOTTLES DRAWN AEROBIC AND ANAEROBIC 10CC  Final   Gram Stain   Final    CYTOSPIN SMEAR WBC PRESENT, PREDOMINANTLY MONONUCLEAR NO ORGANISMS SEEN    Report Status 10/27/2015 FINAL  Final     Labs: Basic Metabolic Panel:  Last Labs      Recent Labs Lab 10/25/15 1722 10/26/15 0306 10/28/15 0705 10/29/15 0336  NA 138 137 135 134*  K 4.1 4.1 4.1 4.2  CL 106 108 108 104  CO2 23 24 22 26   GLUCOSE 186* 116* 150* 198*  BUN 19 19 19  23*  CREATININE 1.48* 1.22 1.36* 1.56*  CALCIUM 8.8* 7.9* 7.8* 7.8*     Liver Function Tests:  Last Labs      Recent Labs Lab 10/25/15 1722 10/28/15 0705  AST 54* 30  ALT 38 22  ALKPHOS 238* 156*  BILITOT 0.8 0.8  PROT 7.7 5.8*  ALBUMIN 2.2* 1.6*      Last Labs      Recent Labs Lab  10/25/15 1722  LIPASE 37      Last Labs     No results for input(s): AMMONIA in the last 168 hours.   CBC:  Last Labs      Recent Labs Lab 10/25/15 1722 10/26/15 0306 10/28/15 0705  WBC 4.4 3.5* 4.4  HGB 13.5 11.1* 11.3*  HCT 40.1 34.0* 32.6*  MCV 92.6 91.4 91.1  PLT 115* 93* 84*     Cardiac Enzymes:  Last Labs     No results for input(s): CKTOTAL, CKMB, CKMBINDEX, TROPONINI in the last 168 hours.   BNP: BNP (last 3 results)  Recent Labs (within last 365 days)    No results for input(s): BNP in the last 8760 hours.    ProBNP (last 3 results)  Recent Labs (within last 365 days)    No results for input(s): PROBNP in the last 8760 hours.    CBG:  Last Labs      Recent Labs Lab 10/28/15 1150 10/28/15 1720 10/28/15 2137 10/29/15 0834 10/29/15 1124  GLUCAP 280* 146* 175* 167* 283*  SignedDomenic Polite [facility] 10/29/2015, 11:45 AM

## 2015-11-01 LAB — CULTURE, BODY FLUID-BOTTLE: CULTURE: NO GROWTH

## 2015-11-02 ENCOUNTER — Other Ambulatory Visit (INDEPENDENT_AMBULATORY_CARE_PROVIDER_SITE_OTHER): Payer: Medicare Other

## 2015-11-02 DIAGNOSIS — R188 Other ascites: Secondary | ICD-10-CM

## 2015-11-02 DIAGNOSIS — K7469 Other cirrhosis of liver: Secondary | ICD-10-CM

## 2015-11-02 DIAGNOSIS — D500 Iron deficiency anemia secondary to blood loss (chronic): Secondary | ICD-10-CM | POA: Diagnosis not present

## 2015-11-02 LAB — CBC
HEMATOCRIT: 39.2 % (ref 39.0–52.0)
Hemoglobin: 12.9 g/dL — ABNORMAL LOW (ref 13.0–17.0)
MCHC: 33 g/dL (ref 30.0–36.0)
MCV: 92.3 fl (ref 78.0–100.0)
Platelets: 140 10*3/uL — ABNORMAL LOW (ref 150.0–400.0)
RBC: 4.24 Mil/uL (ref 4.22–5.81)
RDW: 15.2 % (ref 11.5–15.5)
WBC: 5.7 10*3/uL (ref 4.0–10.5)

## 2015-11-02 LAB — COMPREHENSIVE METABOLIC PANEL
ALBUMIN: 2.2 g/dL — AB (ref 3.5–5.2)
ALT: 35 U/L (ref 0–53)
AST: 47 U/L — AB (ref 0–37)
Alkaline Phosphatase: 220 U/L — ABNORMAL HIGH (ref 39–117)
BUN: 30 mg/dL — AB (ref 6–23)
CHLORIDE: 103 meq/L (ref 96–112)
CO2: 26 meq/L (ref 19–32)
CREATININE: 1.48 mg/dL (ref 0.40–1.50)
Calcium: 8.4 mg/dL (ref 8.4–10.5)
GFR: 49.04 mL/min — ABNORMAL LOW (ref >60.00)
GLUCOSE: 152 mg/dL — AB (ref 70–99)
Potassium: 4.3 mEq/L (ref 3.5–5.1)
SODIUM: 135 meq/L (ref 135–145)
Total Bilirubin: 0.6 mg/dL (ref 0.2–1.2)
Total Protein: 6.9 g/dL (ref 6.0–8.3)

## 2015-11-03 ENCOUNTER — Encounter: Payer: Self-pay | Admitting: Physician Assistant

## 2015-11-03 ENCOUNTER — Ambulatory Visit (INDEPENDENT_AMBULATORY_CARE_PROVIDER_SITE_OTHER): Payer: Medicare Other | Admitting: Physician Assistant

## 2015-11-03 ENCOUNTER — Other Ambulatory Visit (INDEPENDENT_AMBULATORY_CARE_PROVIDER_SITE_OTHER): Payer: Medicare Other

## 2015-11-03 VITALS — BP 130/66 | HR 62 | Ht 66.0 in | Wt 119.0 lb

## 2015-11-03 DIAGNOSIS — I8510 Secondary esophageal varices without bleeding: Secondary | ICD-10-CM

## 2015-11-03 DIAGNOSIS — R188 Other ascites: Secondary | ICD-10-CM

## 2015-11-03 DIAGNOSIS — K7460 Unspecified cirrhosis of liver: Secondary | ICD-10-CM

## 2015-11-03 DIAGNOSIS — B1920 Unspecified viral hepatitis C without hepatic coma: Secondary | ICD-10-CM

## 2015-11-03 DIAGNOSIS — K7030 Alcoholic cirrhosis of liver without ascites: Secondary | ICD-10-CM

## 2015-11-03 LAB — AMMONIA: AMMONIA: 41 umol/L — AB (ref 11–35)

## 2015-11-03 MED ORDER — NADOLOL 40 MG PO TABS: 40.0000 mg | ORAL_TABLET | Freq: Every day | ORAL | Status: AC

## 2015-11-03 NOTE — Patient Instructions (Addendum)
Your physician has requested that you go to the basement for the following lab work before leaving today: Hep C labs, Ammonia TODAY   In one one week have more labs: CBC, BMET, [place]  Stop taking Lisinopril-hydrochlorothiazide   We have sent the following medications to your pharmacy for you to pick up at your convenience: Nadolol 40 mg one by mouth daily.  We have requested an appointment with [facility] care. They will contact you to schedule the appointment.

## 2015-11-04 ENCOUNTER — Other Ambulatory Visit: Payer: Self-pay | Admitting: *Deleted

## 2015-11-04 DIAGNOSIS — K7460 Unspecified cirrhosis of liver: Secondary | ICD-10-CM

## 2015-11-04 LAB — HEPATITIS C RNA QUANTITATIVE: HCV Quantitative: NOT DETECTED IU/mL (ref <15)

## 2015-11-04 MED ORDER — LACTULOSE 10 GM/15ML PO SOLN: ORAL | Status: DC

## 2015-11-05 ENCOUNTER — Encounter: Payer: Self-pay | Admitting: Physician Assistant

## 2015-11-05 NOTE — Progress Notes (Signed)
Patient ID: Christian Wallace, male   DOB: 03/05/1941, 74 y.o.   MRN: [id number]     History of Present Illness: Christian Wallace is a pleasant [place] gentleman who speaks no [place]. He was recently admitted through the hospital 10/25/2015 through 10/29/2015. He has a history of biliary pancreatitis and is status post ERCP with stone extraction in 2013. He has never had a cholecystectomy. He has a history of cirrhosis but no hepatoma by follow-up CAT scan performed in July 2013. As a history of hepatitis C antibody positivity, hepatitis B surface antibody and core antibody positivity, hepatitis B surface antigen negative. He has a questionable history of diabetes type 2, with a history of hyperglycemia, thrombocytopenia, anemia, and diastolic dysfunction.  When admitted to the hospital on November 30 he complained of diffuse midabdominal pain of approximately a weeks' duration. His discomfort was associated with swelling of the abdomen. CT scan showed cirrhosis, portal hypertension, and esophageal gastric varices, moderate ascites. Contrast in the distal esophagus/GE junction suggested variceal bleed. Cholelithiasis. Colonic thickening likely due to ascites/cirrhosis cannot colitis. Platelets were 93,000 and coags were normal. He underwent an EGD on 10/27/2015 and was noted to have 3 columns of medium varices with red nipples in the mid to distal esophagus, otherwise normal EGD. Given the size and red nipples and his overall status it was thought that band ligation would be a reasonable option however a banding device was not able to traverse the upper esophageal sphincter despite several attempts. Patient was started on furosemide and spironolactone as well as not a lot. Patient also had a paracentesis of 4 L on December 1, no signs of SBP. He was advised to follow-up in the office for management of decompensated cirrhosis. It was felt that should he have proven bleeding from varices attempts could be made at banding again but  that the patient may need sclerosis versus TIPS and octreotide. Patient's daughter also states the patient was advised that he would need further evaluation of his hepatitis C. Patient reports through his translator who is his daughter that he has been feeling well, he has no nausea or vomiting, his appetite has been good. He has had no bright red blood per rectum or melena. He does feel a little bit sleepy and his daughter states he has been forgetful.   Past Medical History  Diagnosis Date  . Diabetes mellitus 2014  . HTN (hypertension)   . HLD (hyperlipidemia)   . Anginal pain ([place])   . Dysphagia   . Neuromuscular disorder (HCC)     muscle pain in arms  . Cirrhosis ([place]) 05/2012    Hep C Ab +, Hep B surface Ab and core Ab +, Hep B Surface Ag -  . Biliary acute pancreatitis 05/2012    Past Surgical History  Procedure Laterality Date  . No past surgeries    . Ercp  05/22/2012    Procedure: ENDOSCOPIC RETROGRADE CHOLANGIOPANCREATOGRAPHY (ERCP);  Surgeon: Beryle Beams, MD;  Location: [facility];  Service: Endoscopy;  Laterality: N/A;  . Esophagogastroduodenoscopy N/A 10/27/2015    Procedure: ESOPHAGOGASTRODUODENOSCOPY (EGD);  Surgeon: Gatha Mayer, MD;  Location: [facility] ENDOSCOPY;  Service: Endoscopy;  Laterality: N/A;   History reviewed. No pertinent family history. Social History  Substance Use Topics  . Smoking status: Former Smoker    Types: Cigarettes  . Smokeless tobacco: Never Used  . Alcohol Use: 0.0 oz/week    0 Standard drinks or equivalent per week     Comment: heavy ETOH  consumption in past   Current Outpatient Prescriptions  Medication Sig Dispense Refill  . furosemide (LASIX) 20 MG tablet Take 1 tablet (20 mg total) by mouth daily. 30 tablet 0  . lisinopril-hydrochlorothiazide (PRINZIDE,ZESTORETIC) 20-25 MG tablet Take 1 tablet by mouth daily.    . metFORMIN (GLUCOPHAGE) 1000 MG tablet Take 1,000 mg by mouth 2 (two) times daily.    . simvastatin (ZOCOR) 40 MG tablet  Take 40 mg by mouth daily.     Marland Kitchen spironolactone (ALDACTONE) 50 MG tablet Take 1 tablet (50 mg total) by mouth daily. 30 tablet 0  . lactulose (CHRONULAC) 10 GM/15ML solution Take 30 ml BID 1800 mL 3  . nadolol (CORGARD) 40 MG tablet Take 1 tablet (40 mg total) by mouth daily. 30 tablet 3   No current facility-administered medications for this visit.   No Known Allergies   Review of Systems: Gen: Denies any fever, chills, sweats, anorexia, fatigue, weakness, malaise, weight loss, and sleep disorder CV: Denies chest pain, angina, palpitations, syncope, orthopnea, PND, peripheral edema, and claudication. Resp: Denies dyspnea at rest, dyspnea with exercise, cough, sputum, wheezing, coughing up blood, and pleurisy. GI: Denies vomiting blood, jaundice, and fecal incontinence.   Denies dysphagia or odynophagia. GU : Denies urinary burning, blood in urine, urinary frequency, urinary hesitancy, nocturnal urination, and urinary incontinence. MS: Denies joint pain, limitation of movement, and swelling, stiffness, low back pain, extremity pain. Denies muscle weakness, cramps, atrophy.  Derm: Denies rash, itching, dry skin, hives, moles, warts, or unhealing ulcers.  Psych: Denies depression, anxiety, memory loss, suicidal ideation, hallucinations, paranoia, and confusion. Heme: Denies bruising, bleeding, and enlarged lymph nodes. Neuro:  Denies any headaches, dizziness, paresthesia Endo:  Denies any problems with DM, thyroid, adrenal     Studies:   Ct Abdomen Pelvis W Contrast  10/26/2015  CLINICAL DATA:  74 year old male with abdominal distention x5 6 days. Diffuse abdominal pain. EXAM: CT ABDOMEN AND PELVIS WITH CONTRAST TECHNIQUE: Multidetector CT imaging of the abdomen and pelvis was performed using the standard protocol following bolus administration of intravenous contrast. CONTRAST:  15m OMNIPAQUE IOHEXOL 300 MG/ML  SOLN COMPARISON:  Abdominal CT dated 05/28/2012 and chest CT dated  11/27/2013 FINDINGS: There bibasilar dependent atelectatic changes/scarring. Stable mild cardiomegaly. There is coronary vascular calcification. No intra-abdominal free air. Moderate diffuse ascites, new from prior study. There are morphologic changes of cirrhosis with surface irregularity of the liver. Multiple gallstones. Evaluation of the gallbladder is limited due to ascites. The pancreas, spleen, and adrenal glands appear unremarkable. The kidneys, visualized ureters, and urinary bladder appear unremarkable. The prostate gland and seminal vesicles are grossly unremarkable. There is a focal area of contrast collection at the gastroesophageal junction extending into the distal esophagus compatible with variceal bleed. There is no evidence of bowel obstruction. There is apparent thickening of the ascending colon, likely related to underdistention and secondary to hepatic colopathy. Colitis is less likely but not excluded. Clinical correlation is recommended. The appendix appears unremarkable. Aortoiliac atherosclerotic disease. The origins of the celiac axis, SMA, IMA as well as the origins of the renal arteries are patent. No portal venous gas identified. Paraesophageal and gastric varices noted. There is no lymphadenopathy. There is multilevel degenerative changes of the spine. No acute fracture. There is diffuse subcutaneous soft tissue stranding and anasarca. IMPRESSION: Cirrhosis with evidence of portal hypertension and paraesophageal and gastric versus. Contrast noted at the distal esophagus and gastroesophageal junction compatible with variceal bleed. Moderate ascites, new from prior study. Apparent thickening of  the ascending colon likely related to cirrhosis and ascites and less likely colitis. Clinical correlation is recommended. No bowel obstruction. Cholelithiasis. Electronically Signed   By: Anner Crete M.D.   On: 10/26/2015 00:47   US Paracentesis  10/27/2015  CLINICAL DATA:  Abdominal  discomfort, distention. Request for diagnostic and therapeutic paracentesis of ascites, up to 4 L max. EXAM: ULTRASOUND GUIDED PARACENTESIS COMPARISON:  None. PROCEDURE: An ultrasound guided paracentesis was thoroughly discussed with the patient and questions answered. The benefits, risks, alternatives and complications were also discussed. The patient understands and wishes to proceed with the procedure. Written consent was obtained. Ultrasound was performed to localize and mark an adequate pocket of fluid in the right lower quadrant of the abdomen. The area was then prepped and draped in the normal sterile fashion. 1% Lidocaine was used for local anesthesia. Under ultrasound guidance a 19 gauge Yueh catheter was introduced. Paracentesis was performed. The catheter was removed and a dressing applied. COMPLICATIONS: None immediate FINDINGS: A total of approximately 4 L of clear yellow fluid was removed. A fluid sample was sent for laboratory analysis. IMPRESSION: Successful ultrasound guided paracentesis yielding 4 L of ascites. Read by: Ascencion Dike PA-C Electronically Signed   By: Jacqulynn Cadet M.D.   On: 10/27/2015 12:41     Physical Exam: BP 130/66 mmHg  Pulse 62  Ht 5' 6"  (1.676 m)  Wt 119 lb (53.978 kg)  BMI 19.22 kg/m2 General: Pleasant, well developed , male in no acute distress Head: Normocephalic and atraumatic Eyes:  sclerae anicteric, conjunctiva pink  Ears: Normal auditory acuity Lungs: Clear throughout to auscultation Heart: Regular rate and rhythm Abdomen: Soft, non distended, non-tender. No masses, no hepatomegaly. Normal bowel sounds Musculoskeletal: Symmetrical with no gross deformities  Extremities: No edema  Neurological: Alert oriented x 4, grossly nonfocal. No asterixis Psychological:  Alert and cooperative. Normal mood and affect  Assessment and Recommendations: 74 year old male with a history of alcoholic cirrhosis. Alcohol use in remission. Patient has history of  hepatitis C antibody positivity, hep B surface antibody and core antibody positivity, hep B surface antigen negative.  He should noted to have esophageal and gastric varices and portal hypertension as well as a question of esophageal variceal bleeding on CT scan. EGD December 1 showed 3 columns of medium varices with red nipples, mid to distal esophagus. Unable to pass band ligation device beyond upper esophageal sphincter, some not banded. Patient was started on nadolol. Patient had a paracentesis of 4 L of fluid on 10/27/2015, no signs of SBP. He was started on Aldactone and Lasix. Patient currently feeling well. He has continued taking his lisinopril and hydrochlorothiazide and was advised to discontinue this. He will start nadolol 40 mg 1 by mouth daily. He will continue Lasix 20 mg daily and spironolactone 50 mg daily. He will have a basic metabolic panel, CBC, and ammonia in 1 week. An ammonia will be checked today as well. A hepatitis C genotype and hepatitis C quantitative PCR will be drawn today. Patient will be scheduled for evaluation at the [place] [facility] for possible valves/treatment of his hepatitis C. Patient has an appointment in February with Dr. Havery Moros but patient's family would like to see Dr. Carlean Purl if possible since they have met him in the hospital. Patient will follow up in one month, sooner if needed.       Melford Tullier, Vita Barley PA-C 11/05/2015,

## 2015-11-07 NOTE — Progress Notes (Signed)
Agree w/ Ms. Hvozdovic's note and mangement.  

## 2015-11-08 LAB — HEPATITIS C GENOTYPE

## 2015-11-10 ENCOUNTER — Other Ambulatory Visit (INDEPENDENT_AMBULATORY_CARE_PROVIDER_SITE_OTHER): Payer: Medicare Other

## 2015-11-10 DIAGNOSIS — I8510 Secondary esophageal varices without bleeding: Secondary | ICD-10-CM | POA: Diagnosis not present

## 2015-11-10 DIAGNOSIS — K7460 Unspecified cirrhosis of liver: Secondary | ICD-10-CM

## 2015-11-10 DIAGNOSIS — B1920 Unspecified viral hepatitis C without hepatic coma: Secondary | ICD-10-CM

## 2015-11-10 DIAGNOSIS — R188 Other ascites: Secondary | ICD-10-CM

## 2015-11-10 LAB — BASIC METABOLIC PANEL
BUN: 26 mg/dL — AB (ref 6–23)
CALCIUM: 8.7 mg/dL (ref 8.4–10.5)
CO2: 26 meq/L (ref 19–32)
CREATININE: 1.58 mg/dL — AB (ref 0.40–1.50)
Chloride: 108 mEq/L (ref 96–112)
GFR: 45.47 mL/min — ABNORMAL LOW (ref >60.00)
Glucose, Bld: 111 mg/dL — ABNORMAL HIGH (ref 70–99)
Potassium: 4.5 mEq/L (ref 3.5–5.1)
Sodium: 139 mEq/L (ref 135–145)

## 2015-11-10 LAB — CBC WITH DIFFERENTIAL/PLATELET
Basophils Absolute: 0 10*3/uL (ref 0.0–0.1)
Basophils Relative: 0.6 % (ref 0.0–3.0)
EOS PCT: 3.3 % (ref 0.0–5.0)
Eosinophils Absolute: 0.2 10*3/uL (ref 0.0–0.7)
HEMATOCRIT: 38.8 % — AB (ref 39.0–52.0)
Hemoglobin: 12.9 g/dL — ABNORMAL LOW (ref 13.0–17.0)
LYMPHS ABS: 1.8 10*3/uL (ref 0.7–4.0)
LYMPHS PCT: 34.8 % (ref 12.0–46.0)
MCHC: 33.3 g/dL (ref 30.0–36.0)
MCV: 93 fl (ref 78.0–100.0)
MONOS PCT: 9.3 % (ref 3.0–12.0)
Monocytes Absolute: 0.5 10*3/uL (ref 0.1–1.0)
NEUTROS ABS: 2.7 10*3/uL (ref 1.4–7.7)
NEUTROS PCT: 52 % (ref 43.0–77.0)
Platelets: 130 10*3/uL — ABNORMAL LOW (ref 150.0–400.0)
RBC: 4.17 Mil/uL — AB (ref 4.22–5.81)
RDW: 15.4 % (ref 11.5–15.5)
WBC: 5.1 10*3/uL (ref 4.0–10.5)

## 2015-11-10 LAB — AMMONIA: Ammonia: 41 umol/L — ABNORMAL HIGH (ref 11–35)

## 2015-11-11 ENCOUNTER — Other Ambulatory Visit: Payer: Self-pay | Admitting: *Deleted

## 2015-11-11 DIAGNOSIS — K7460 Unspecified cirrhosis of liver: Secondary | ICD-10-CM

## 2015-11-15 ENCOUNTER — Other Ambulatory Visit (INDEPENDENT_AMBULATORY_CARE_PROVIDER_SITE_OTHER): Payer: Medicare Other

## 2015-11-15 DIAGNOSIS — K7460 Unspecified cirrhosis of liver: Secondary | ICD-10-CM

## 2015-11-15 LAB — COMPREHENSIVE METABOLIC PANEL
ALT: 40 U/L (ref 0–53)
AST: 48 U/L — AB (ref 0–37)
Albumin: 2.3 g/dL — ABNORMAL LOW (ref 3.5–5.2)
Alkaline Phosphatase: 223 U/L — ABNORMAL HIGH (ref 39–117)
BUN: 21 mg/dL (ref 6–23)
CALCIUM: 8.8 mg/dL (ref 8.4–10.5)
CHLORIDE: 110 meq/L (ref 96–112)
CO2: 22 meq/L (ref 19–32)
CREATININE: 1.56 mg/dL — AB (ref 0.40–1.50)
GFR: 46.15 mL/min — ABNORMAL LOW (ref >60.00)
GLUCOSE: 128 mg/dL — AB (ref 70–99)
Potassium: 4.8 mEq/L (ref 3.5–5.1)
Sodium: 139 mEq/L (ref 135–145)
Total Bilirubin: 0.5 mg/dL (ref 0.2–1.2)
Total Protein: 7.3 g/dL (ref 6.0–8.3)

## 2015-11-15 LAB — AMMONIA: AMMONIA: 39 umol/L — AB (ref 11–35)

## 2015-11-16 ENCOUNTER — Other Ambulatory Visit: Payer: Self-pay | Admitting: *Deleted

## 2015-11-16 DIAGNOSIS — K7031 Alcoholic cirrhosis of liver with ascites: Secondary | ICD-10-CM

## 2015-11-16 MED ORDER — FUROSEMIDE 20 MG PO TABS: 10.0000 mg | ORAL_TABLET | Freq: Every day | ORAL | Status: DC

## 2015-11-16 MED ORDER — SPIRONOLACTONE 50 MG PO TABS: 25.0000 mg | ORAL_TABLET | Freq: Every day | ORAL | Status: DC

## 2015-12-29 ENCOUNTER — Ambulatory Visit: Payer: Medicare Other | Admitting: Internal Medicine

## 2016-01-03 ENCOUNTER — Ambulatory Visit: Payer: Medicare Other | Admitting: Gastroenterology

## 2016-01-15 ENCOUNTER — Other Ambulatory Visit: Payer: Self-pay | Admitting: Physician Assistant

## 2016-01-21 ENCOUNTER — Emergency Department (HOSPITAL_COMMUNITY): Payer: Medicare Other

## 2016-01-21 ENCOUNTER — Encounter (HOSPITAL_COMMUNITY): Payer: Self-pay | Admitting: Emergency Medicine

## 2016-01-21 ENCOUNTER — Inpatient Hospital Stay (HOSPITAL_COMMUNITY)
Admission: EM | Admit: 2016-01-21 | Discharge: 2016-01-24 | DRG: 432 | Disposition: A | Discharge status: Home Health | Payer: Medicare Other | Attending: Internal Medicine | Admitting: Internal Medicine

## 2016-01-21 DIAGNOSIS — I129 Hypertensive chronic kidney disease with stage 1 through stage 4 chronic kidney disease, or unspecified chronic kidney disease: Secondary | ICD-10-CM | POA: Diagnosis present

## 2016-01-21 DIAGNOSIS — K7460 Unspecified cirrhosis of liver: Secondary | ICD-10-CM | POA: Diagnosis present

## 2016-01-21 DIAGNOSIS — D638 Anemia in other chronic diseases classified elsewhere: Secondary | ICD-10-CM | POA: Diagnosis present

## 2016-01-21 DIAGNOSIS — R0602 Shortness of breath: Secondary | ICD-10-CM | POA: Diagnosis not present

## 2016-01-21 DIAGNOSIS — S2232XA Fracture of one rib, left side, initial encounter for closed fracture: Secondary | ICD-10-CM | POA: Diagnosis present

## 2016-01-21 DIAGNOSIS — R262 Difficulty in walking, not elsewhere classified: Secondary | ICD-10-CM | POA: Diagnosis present

## 2016-01-21 DIAGNOSIS — R197 Diarrhea, unspecified: Secondary | ICD-10-CM | POA: Diagnosis present

## 2016-01-21 DIAGNOSIS — W19XXXA Unspecified fall, initial encounter: Secondary | ICD-10-CM | POA: Diagnosis present

## 2016-01-21 DIAGNOSIS — I739 Peripheral vascular disease, unspecified: Secondary | ICD-10-CM | POA: Diagnosis present

## 2016-01-21 DIAGNOSIS — I2584 Coronary atherosclerosis due to calcified coronary lesion: Secondary | ICD-10-CM

## 2016-01-21 DIAGNOSIS — R778 Other specified abnormalities of plasma proteins: Secondary | ICD-10-CM

## 2016-01-21 DIAGNOSIS — Z79899 Other long term (current) drug therapy: Secondary | ICD-10-CM

## 2016-01-21 DIAGNOSIS — I10 Essential (primary) hypertension: Secondary | ICD-10-CM | POA: Diagnosis present

## 2016-01-21 DIAGNOSIS — R188 Other ascites: Secondary | ICD-10-CM

## 2016-01-21 DIAGNOSIS — R1084 Generalized abdominal pain: Secondary | ICD-10-CM

## 2016-01-21 DIAGNOSIS — G9389 Other specified disorders of brain: Secondary | ICD-10-CM | POA: Diagnosis present

## 2016-01-21 DIAGNOSIS — R5381 Other malaise: Secondary | ICD-10-CM | POA: Diagnosis present

## 2016-01-21 DIAGNOSIS — I700 Atherosclerosis of aorta: Secondary | ICD-10-CM | POA: Diagnosis present

## 2016-01-21 DIAGNOSIS — R600 Localized edema: Secondary | ICD-10-CM | POA: Diagnosis present

## 2016-01-21 DIAGNOSIS — R4702 Dysphasia: Secondary | ICD-10-CM | POA: Diagnosis present

## 2016-01-21 DIAGNOSIS — I8510 Secondary esophageal varices without bleeding: Secondary | ICD-10-CM | POA: Diagnosis present

## 2016-01-21 DIAGNOSIS — I679 Cerebrovascular disease, unspecified: Secondary | ICD-10-CM | POA: Diagnosis present

## 2016-01-21 DIAGNOSIS — K8020 Calculus of gallbladder without cholecystitis without obstruction: Secondary | ICD-10-CM | POA: Diagnosis present

## 2016-01-21 DIAGNOSIS — R7989 Other specified abnormal findings of blood chemistry: Secondary | ICD-10-CM

## 2016-01-21 DIAGNOSIS — E785 Hyperlipidemia, unspecified: Secondary | ICD-10-CM | POA: Diagnosis present

## 2016-01-21 DIAGNOSIS — K7031 Alcoholic cirrhosis of liver with ascites: Principal | ICD-10-CM | POA: Diagnosis present

## 2016-01-21 DIAGNOSIS — K7030 Alcoholic cirrhosis of liver without ascites: Secondary | ICD-10-CM

## 2016-01-21 DIAGNOSIS — I639 Cerebral infarction, unspecified: Secondary | ICD-10-CM

## 2016-01-21 DIAGNOSIS — N179 Acute kidney failure, unspecified: Secondary | ICD-10-CM | POA: Diagnosis present

## 2016-01-21 DIAGNOSIS — Z7984 Long term (current) use of oral hypoglycemic drugs: Secondary | ICD-10-CM

## 2016-01-21 DIAGNOSIS — G9340 Encephalopathy, unspecified: Secondary | ICD-10-CM | POA: Diagnosis present

## 2016-01-21 DIAGNOSIS — E875 Hyperkalemia: Secondary | ICD-10-CM | POA: Diagnosis present

## 2016-01-21 DIAGNOSIS — E1122 Type 2 diabetes mellitus with diabetic chronic kidney disease: Secondary | ICD-10-CM | POA: Diagnosis present

## 2016-01-21 DIAGNOSIS — Z87891 Personal history of nicotine dependence: Secondary | ICD-10-CM

## 2016-01-21 DIAGNOSIS — E119 Type 2 diabetes mellitus without complications: Secondary | ICD-10-CM | POA: Diagnosis present

## 2016-01-21 DIAGNOSIS — R748 Abnormal levels of other serum enzymes: Secondary | ICD-10-CM | POA: Diagnosis present

## 2016-01-21 DIAGNOSIS — D696 Thrombocytopenia, unspecified: Secondary | ICD-10-CM | POA: Diagnosis present

## 2016-01-21 DIAGNOSIS — Z8673 Personal history of transient ischemic attack (TIA), and cerebral infarction without residual deficits: Secondary | ICD-10-CM

## 2016-01-21 DIAGNOSIS — I2510 Atherosclerotic heart disease of native coronary artery without angina pectoris: Secondary | ICD-10-CM | POA: Diagnosis present

## 2016-01-21 DIAGNOSIS — D649 Anemia, unspecified: Secondary | ICD-10-CM | POA: Diagnosis present

## 2016-01-21 DIAGNOSIS — E869 Volume depletion, unspecified: Secondary | ICD-10-CM | POA: Diagnosis not present

## 2016-01-21 DIAGNOSIS — R0600 Dyspnea, unspecified: Secondary | ICD-10-CM | POA: Diagnosis present

## 2016-01-21 DIAGNOSIS — S0990XA Unspecified injury of head, initial encounter: Secondary | ICD-10-CM | POA: Diagnosis present

## 2016-01-21 DIAGNOSIS — N183 Chronic kidney disease, stage 3 unspecified: Secondary | ICD-10-CM | POA: Diagnosis present

## 2016-01-21 DIAGNOSIS — S0012XA Contusion of left eyelid and periocular area, initial encounter: Secondary | ICD-10-CM | POA: Diagnosis present

## 2016-01-21 DIAGNOSIS — R140 Abdominal distension (gaseous): Secondary | ICD-10-CM | POA: Diagnosis present

## 2016-01-21 DIAGNOSIS — D6959 Other secondary thrombocytopenia: Secondary | ICD-10-CM | POA: Diagnosis present

## 2016-01-21 DIAGNOSIS — Z23 Encounter for immunization: Secondary | ICD-10-CM

## 2016-01-21 HISTORY — DX: Personal history of transient ischemic attack (TIA), and cerebral infarction without residual deficits: Z86.73

## 2016-01-21 HISTORY — DX: Other specified disorders of brain: G93.89

## 2016-01-21 HISTORY — DX: Atherosclerosis of aorta: I70.0

## 2016-01-21 HISTORY — DX: Thrombocytopenia, unspecified: D69.6

## 2016-01-21 HISTORY — DX: Acute pancreatitis without necrosis or infection, unspecified: K85.90

## 2016-01-21 HISTORY — DX: Cerebral infarction, unspecified: I63.9

## 2016-01-21 HISTORY — DX: Chronic kidney disease, stage 3 (moderate): N18.3

## 2016-01-21 HISTORY — DX: Anemia, unspecified: D64.9

## 2016-01-21 HISTORY — DX: Coronary atherosclerosis due to calcified coronary lesion: I25.84

## 2016-01-21 HISTORY — DX: Atherosclerotic heart disease of native coronary artery without angina pectoris: I25.10

## 2016-01-21 NOTE — ED Notes (Signed)
Pt from home with hx of cirrhosis and ascites which was last drained in Nov of last year. Pt has c/o sob but he states it is because it hurts to take a deep breath. Per Pt's family pt has been increasingly confused. Pt is alert to person, place, and situation but not time.

## 2016-01-21 NOTE — ED Provider Notes (Signed)
CSN: [id number]     Arrival date & time 01/21/16  2215 History   By signing my name below, I, Christian Wallace, attest that this documentation has been prepared under the direction and in the presence of Merryl Hacker, MD . Electronically Signed: Rowan Wallace, Scribe. 01/21/2016. 11:46 PM.   Chief Complaint  Patient presents with  . Ascites  . Shortness of Breath  . Altered Mental Status   The history is provided by the patient and a relative. No language interpreter was used.   HPI Comments:  Christian Wallace is a 75 y.o. male with PMHx of DM, HTN, HLD, and cirrhosis who presents to the Emergency Department complaining of shortness of breath for two weeks. Pt reports associated abdominal distention, 8/10 abdominal pain, diarrhea, and bilateral foot swelling. The pt is having difficulty walking and moving according to the family. No alleviating factors noted. They report pt fell PTA and has been falling a lot recently; he currently lives with a relative. Pt also has a black eye (left), which family reports has been there for the past three days. Pt was hospitalized in Nov 2016 and family states he had fluid drained.  Pt takes Nadolol and Lactulose currently; pt has not seen primary care recently. Pt denies chest pain or vomiting.  Patient's admission in late November, he had a paracentesis resulting in 4 L of fluid off of his abdomen. No signs of SBP. At that time he was also evaluated for esophageal varices. He had one banded. He followed up once with GI as an outpatient.  Past Medical History  Diagnosis Date  . Diabetes mellitus 2014  . HTN (hypertension)   . HLD (hyperlipidemia)   . Anginal pain ([place])   . Dysphagia   . Neuromuscular disorder (HCC)     muscle pain in arms  . Cirrhosis ([place]) 05/2012    Hep C Ab +, Hep B surface Ab and core Ab +, Hep B Surface Ag -  . Biliary acute pancreatitis 05/2012   Past Surgical History  Procedure Laterality Date  . Ercp  05/22/2012    Procedure:  ENDOSCOPIC RETROGRADE CHOLANGIOPANCREATOGRAPHY (ERCP);  Surgeon: Beryle Beams, MD;  Location: [facility];  Service: Endoscopy;  Laterality: N/A;  . Esophagogastroduodenoscopy N/A 10/27/2015    Procedure: ESOPHAGOGASTRODUODENOSCOPY (EGD);  Surgeon: Gatha Mayer, MD;  Location: [facility] ENDOSCOPY;  Service: Endoscopy;  Laterality: N/A;   History reviewed. No pertinent family history. Social History  Substance Use Topics  . Smoking status: Former Smoker    Types: Cigarettes  . Smokeless tobacco: Never Used  . Alcohol Use: 0.0 oz/week    0 Standard drinks or equivalent per week     Comment: heavy ETOH consumption in past    Review of Systems  Constitutional: Negative for fever.  Respiratory: Positive for shortness of breath.   Cardiovascular: Positive for leg swelling. Negative for chest pain.  Gastrointestinal: Positive for abdominal pain, diarrhea and abdominal distention. Negative for vomiting.  All other systems reviewed and are negative.   Allergies  Review of patient's allergies indicates no known allergies.  Home Medications   Prior to Admission medications   Medication Sig Start Date End Date Taking? Authorizing Provider  furosemide (LASIX) 20 MG tablet Take 0.5 tablets (10 mg total) by mouth daily. 11/16/15  Yes Lori P Hvozdovic, PA-C  lactulose (CHRONULAC) 10 GM/15ML solution Take 30 ml BID 11/04/15  Yes Lori P Hvozdovic, PA-C  metFORMIN (GLUCOPHAGE) 1000 MG tablet Take 1,000 mg by mouth  2 (two) times daily.   Yes Historical Provider, MD  nadolol (CORGARD) 40 MG tablet Take 1 tablet (40 mg total) by mouth daily. 11/03/15  Yes Lori P Hvozdovic, PA-C  simvastatin (ZOCOR) 40 MG tablet Take 40 mg by mouth daily.  05/27/12  Yes Historical Provider, MD  spironolactone (ALDACTONE) 50 MG tablet Take 0.5 tablets (25 mg total) by mouth daily. 11/16/15  Yes Lori P Hvozdovic, PA-C   BP 187/89 mmHg  Pulse 63  Temp(Src) 97.3 F (36.3 C) (Oral)  Resp 10  Ht 5\' 6"  (1.676 m)  SpO2  100% Physical Exam  Constitutional: He is oriented to person, place, and time.  Chronically ill-appearing, no acute distress  HENT:  Head: Normocephalic.  Bruising noted about the left eye, and mild swelling noted  Eyes: EOM are normal. Pupils are equal, round, and reactive to light.  Cardiovascular: Normal rate, regular rhythm and normal heart sounds.   No murmur heard. Pulmonary/Chest: Effort normal and breath sounds normal. No respiratory distress. He has no wheezes.  Abdominal: Soft. Bowel sounds are normal. There is tenderness. There is no rebound.  Diffuse ttp, distended, +fluid wave  Musculoskeletal: He exhibits edema.  3+ extremity edema, pitting  Neurological: He is alert and oriented to person, place, and time.  Skin: Skin is warm and dry.  Psychiatric: He has a normal mood and affect.  Nursing note and vitals reviewed.   ED Course  Procedures   CRITICAL CARE Performed by: Merryl Hacker   Total critical care time: 30 minutes  Critical care time was exclusive of separately billable procedures and treating other patients.  Critical care was necessary to treat or prevent imminent or life-threatening deterioration.  Critical care was time spent personally by me on the following activities: development of treatment plan with patient and/or surrogate as well as nursing, discussions with consultants, evaluation of patient's response to treatment, examination of patient, obtaining history from patient or surrogate, ordering and performing treatments and interventions, ordering and review of laboratory studies, ordering and review of radiographic studies, pulse oximetry and re-evaluation of patient's condition.  DIAGNOSTIC STUDIES:  Oxygen Saturation is 99% on RA, normal by my interpretation.    COORDINATION OF CARE:  11:36 PM Will order head CT, chest x-ray and EKG. Will order labwork and UA. Discussed treatment plan with pt at bedside and pt agreed to plan.  Labs  Review Labs Reviewed  CBC WITH DIFFERENTIAL/PLATELET - Abnormal; Notable for the following:    RBC 3.69 (*)    Hemoglobin 11.7 (*)    HCT 35.4 (*)    RDW 15.8 (*)    Platelets 141 (*)    All other components within normal limits  COMPREHENSIVE METABOLIC PANEL - Abnormal; Notable for the following:    Potassium 6.4 (*)    Glucose, Bld 169 (*)    BUN 24 (*)    Creatinine, Ser 1.56 (*)    Calcium 8.6 (*)    Albumin 2.4 (*)    AST 54 (*)    Alkaline Phosphatase 271 (*)    GFR calc non Af Amer 41 (*)    GFR calc Af Amer 48 (*)    All other components within normal limits  URINALYSIS, ROUTINE W REFLEX MICROSCOPIC (NOT AT [facility]) - Abnormal; Notable for the following:    Glucose, UA 100 (*)    Hgb urine dipstick TRACE (*)    Protein, ur 30 (*)    All other components within normal limits  TROPONIN I -  Abnormal; Notable for the following:    Troponin I 0.05 (*)    All other components within normal limits  URINE MICROSCOPIC-ADD ON - Abnormal; Notable for the following:    Squamous Epithelial / LPF 0-5 (*)    Bacteria, UA RARE (*)    Casts HYALINE CASTS (*)    All other components within normal limits  BODY FLUID CULTURE  LIPASE, BLOOD  AMMONIA  PROTIME-INR  LACTATE DEHYDROGENASE, BODY FLUID  GLUCOSE, PERITONEAL FLUID  PROTEIN, BODY FLUID  ALBUMIN, FLUID  BODY FLUID CELL COUNT WITH DIFFERENTIAL    Imaging Review Dg Chest 2 View  01/22/2016  CLINICAL DATA:  Shortness of breath for 2 weeks, associated abdominal distention. History of diabetes, hypertension and cirrhosis. EXAM: CHEST  2 VIEW COMPARISON:  Chest radiograph November 27, 2013 FINDINGS: Cardiac silhouette is upper limits of normal flex, calcified aortic knob. Low inspiratory examination with bibasilar strandy densities. No pleural effusion focal consolidation. No pneumothorax. Distended abdomen with hazy density. Acute appearing nondisplaced LEFT posterior ninth rib fracture. IMPRESSION: Borderline cardiomegaly and  bibasilar atelectasis in this low inspiratory examination. Hazy distended abdomen suggests ascites, partially imaged. Acute LEFT posterior ninth rib fracture. Electronically Signed   By: Elon Alas M.D.   On: 01/22/2016 00:41   Ct Head Wo Contrast  01/22/2016  CLINICAL DATA:  Acute onset of worsening confusion. Initial encounter. EXAM: CT HEAD WITHOUT CONTRAST TECHNIQUE: Contiguous axial images were obtained from the base of the skull through the vertex without intravenous contrast. COMPARISON:  CT of the head performed 11/09/2010 FINDINGS: There is no evidence of acute infarction, mass lesion, or intra- or extra-axial hemorrhage on CT. Prominence of the ventricles and sulci reflects moderate cortical volume loss, more prominent than on the prior study. Cerebellar atrophy is noted. Relatively diffuse periventricular and subcortical white matter change likely reflects small vessel ischemic microangiopathy. Small chronic lacunar infarcts are noted at the right basal ganglia. A small infarct is noted at the right occipital lobe, with associated encephalomalacia. The brainstem and fourth ventricle are within normal limits. No mass effect or midline shift is seen. There is no evidence of fracture; visualized osseous structures are unremarkable in appearance. The visualized portions of the orbits are within normal limits. The paranasal sinuses and mastoid air cells are well-aerated. Soft tissue swelling is noted lateral to the left orbit and zygomatic arch. IMPRESSION: 1. No acute intracranial pathology seen on CT. 2. Soft tissue swelling noted lateral to the left orbit and zygomatic arch. 3. Moderate cortical volume loss and diffuse small vessel ischemic microangiopathy. 4. 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 Small infarct at the right occipital lobe, with associated encephalomalacia. Electronically Signed   By: Garald Balding M.D.   On: 01/22/2016 00:32   I have personally reviewed and  evaluated these images and lab results as part of my medical decision-making.   EKG Interpretation   Date/Time:  Saturday January 21 2016 23:53:00 EST Ventricular Rate:  59 PR Interval:  171 QRS Duration: 85 QT Interval:  424 QTC Calculation: 420 R Axis:   -25 Text Interpretation:  Sinus rhythm Borderline left axis deviation Anterior  infarct, old Abnormal T, consider ischemia, lateral leads Minimal ST  elevation, inferior leads Baseline wander in lead(s) V6 Confirmed by  HORTON  MD, COURTNEY ([id number]) on 01/22/2016 12:29:55 AM      MDM   Final diagnoses:  Cirrhosis of liver with ascites, unspecified hepatic cirrhosis type (HCC)  Ascites  Generalized abdominal pain  Hyperkalemia  Elevated troponin    Patient presents with diffuse abdominal pain and swelling. Also shortness of breath. Chronically ill-appearing but nontoxic on exam. Evidence of ascites and diffuse abdominal tenderness. Vital signs are reassuring. Lab work is notable for a potassium of 6.4 without hemolysis. Patient was given calcium gluconate, insulin, and glucose. Creatinine is at his baseline. Mild elevation in LFTs. Troponin 0.05. Denies chest pain and EKG nonspecific changes. Doubt ACS. Paracentesis was performed to rule out SBP. No empiric antibiotics given at this time. CT abdomen and pelvis pending. Will admit for further workup. Patient may need a therapeutic paracentesis at some time given his symptoms of shortness of breath.  I personally performed the services described in this documentation, which was scribed in my presence. The recorded information has been reviewed and is accurate.    Merryl Hacker, MD 01/22/16 [id number]

## 2016-01-22 ENCOUNTER — Emergency Department (HOSPITAL_COMMUNITY): Payer: Medicare Other

## 2016-01-22 ENCOUNTER — Encounter (HOSPITAL_COMMUNITY): Payer: Self-pay | Admitting: Internal Medicine

## 2016-01-22 ENCOUNTER — Other Ambulatory Visit: Payer: Self-pay

## 2016-01-22 DIAGNOSIS — N183 Chronic kidney disease, stage 3 (moderate): Secondary | ICD-10-CM | POA: Diagnosis present

## 2016-01-22 DIAGNOSIS — K7031 Alcoholic cirrhosis of liver with ascites: Secondary | ICD-10-CM | POA: Diagnosis present

## 2016-01-22 DIAGNOSIS — R7989 Other specified abnormal findings of blood chemistry: Secondary | ICD-10-CM

## 2016-01-22 DIAGNOSIS — G9389 Other specified disorders of brain: Secondary | ICD-10-CM | POA: Diagnosis present

## 2016-01-22 DIAGNOSIS — E119 Type 2 diabetes mellitus without complications: Secondary | ICD-10-CM | POA: Diagnosis present

## 2016-01-22 DIAGNOSIS — E785 Hyperlipidemia, unspecified: Secondary | ICD-10-CM | POA: Diagnosis present

## 2016-01-22 DIAGNOSIS — S2232XA Fracture of one rib, left side, initial encounter for closed fracture: Secondary | ICD-10-CM | POA: Diagnosis present

## 2016-01-22 DIAGNOSIS — R140 Abdominal distension (gaseous): Secondary | ICD-10-CM | POA: Diagnosis present

## 2016-01-22 DIAGNOSIS — Z87891 Personal history of nicotine dependence: Secondary | ICD-10-CM | POA: Diagnosis not present

## 2016-01-22 DIAGNOSIS — I8510 Secondary esophageal varices without bleeding: Secondary | ICD-10-CM | POA: Diagnosis present

## 2016-01-22 DIAGNOSIS — D6959 Other secondary thrombocytopenia: Secondary | ICD-10-CM | POA: Diagnosis present

## 2016-01-22 DIAGNOSIS — Z8673 Personal history of transient ischemic attack (TIA), and cerebral infarction without residual deficits: Secondary | ICD-10-CM | POA: Diagnosis not present

## 2016-01-22 DIAGNOSIS — Z7984 Long term (current) use of oral hypoglycemic drugs: Secondary | ICD-10-CM | POA: Diagnosis not present

## 2016-01-22 DIAGNOSIS — R262 Difficulty in walking, not elsewhere classified: Secondary | ICD-10-CM | POA: Diagnosis present

## 2016-01-22 DIAGNOSIS — R4702 Dysphasia: Secondary | ICD-10-CM | POA: Diagnosis present

## 2016-01-22 DIAGNOSIS — R0602 Shortness of breath: Secondary | ICD-10-CM | POA: Diagnosis present

## 2016-01-22 DIAGNOSIS — D638 Anemia in other chronic diseases classified elsewhere: Secondary | ICD-10-CM | POA: Diagnosis present

## 2016-01-22 DIAGNOSIS — Z79899 Other long term (current) drug therapy: Secondary | ICD-10-CM | POA: Diagnosis not present

## 2016-01-22 DIAGNOSIS — E875 Hyperkalemia: Secondary | ICD-10-CM | POA: Diagnosis present

## 2016-01-22 DIAGNOSIS — I739 Peripheral vascular disease, unspecified: Secondary | ICD-10-CM | POA: Diagnosis present

## 2016-01-22 DIAGNOSIS — R0600 Dyspnea, unspecified: Secondary | ICD-10-CM | POA: Diagnosis not present

## 2016-01-22 DIAGNOSIS — I129 Hypertensive chronic kidney disease with stage 1 through stage 4 chronic kidney disease, or unspecified chronic kidney disease: Secondary | ICD-10-CM | POA: Diagnosis present

## 2016-01-22 DIAGNOSIS — S0990XA Unspecified injury of head, initial encounter: Secondary | ICD-10-CM | POA: Diagnosis present

## 2016-01-22 DIAGNOSIS — W19XXXA Unspecified fall, initial encounter: Secondary | ICD-10-CM | POA: Diagnosis present

## 2016-01-22 DIAGNOSIS — G9340 Encephalopathy, unspecified: Secondary | ICD-10-CM | POA: Diagnosis present

## 2016-01-22 DIAGNOSIS — N179 Acute kidney failure, unspecified: Secondary | ICD-10-CM | POA: Diagnosis present

## 2016-01-22 DIAGNOSIS — R748 Abnormal levels of other serum enzymes: Secondary | ICD-10-CM | POA: Diagnosis present

## 2016-01-22 DIAGNOSIS — R600 Localized edema: Secondary | ICD-10-CM | POA: Diagnosis present

## 2016-01-22 DIAGNOSIS — R197 Diarrhea, unspecified: Secondary | ICD-10-CM | POA: Diagnosis present

## 2016-01-22 DIAGNOSIS — I700 Atherosclerosis of aorta: Secondary | ICD-10-CM | POA: Diagnosis present

## 2016-01-22 DIAGNOSIS — Z23 Encounter for immunization: Secondary | ICD-10-CM | POA: Diagnosis not present

## 2016-01-22 DIAGNOSIS — S0012XA Contusion of left eyelid and periocular area, initial encounter: Secondary | ICD-10-CM | POA: Diagnosis present

## 2016-01-22 DIAGNOSIS — I2510 Atherosclerotic heart disease of native coronary artery without angina pectoris: Secondary | ICD-10-CM | POA: Diagnosis present

## 2016-01-22 DIAGNOSIS — K8020 Calculus of gallbladder without cholecystitis without obstruction: Secondary | ICD-10-CM | POA: Diagnosis present

## 2016-01-22 DIAGNOSIS — E1122 Type 2 diabetes mellitus with diabetic chronic kidney disease: Secondary | ICD-10-CM | POA: Diagnosis present

## 2016-01-22 DIAGNOSIS — E869 Volume depletion, unspecified: Secondary | ICD-10-CM | POA: Diagnosis not present

## 2016-01-22 LAB — BASIC METABOLIC PANEL
Anion gap: 5 (ref 5–15)
BUN: 24 mg/dL — AB (ref 6–20)
CO2: 21 mmol/L — ABNORMAL LOW (ref 22–32)
CREATININE: 1.54 mg/dL — AB (ref 0.61–1.24)
Calcium: 8.5 mg/dL — ABNORMAL LOW (ref 8.9–10.3)
Chloride: 110 mmol/L (ref 101–111)
GFR calc Af Amer: 49 mL/min — ABNORMAL LOW (ref >60)
GFR, EST NON AFRICAN AMERICAN: 42 mL/min — AB (ref >60)
Glucose, Bld: 105 mg/dL — ABNORMAL HIGH (ref 65–99)
Potassium: 5.2 mmol/L — ABNORMAL HIGH (ref 3.5–5.1)
SODIUM: 136 mmol/L (ref 135–145)

## 2016-01-22 LAB — COMPREHENSIVE METABOLIC PANEL
ALBUMIN: 2.4 g/dL — AB (ref 3.5–5.0)
ALT: 36 U/L (ref 17–63)
ANION GAP: 5 (ref 5–15)
AST: 54 U/L — AB (ref 15–41)
Alkaline Phosphatase: 271 U/L — ABNORMAL HIGH (ref 38–126)
BUN: 24 mg/dL — ABNORMAL HIGH (ref 6–20)
CO2: 23 mmol/L (ref 22–32)
Calcium: 8.6 mg/dL — ABNORMAL LOW (ref 8.9–10.3)
Chloride: 109 mmol/L (ref 101–111)
Creatinine, Ser: 1.56 mg/dL — ABNORMAL HIGH (ref 0.61–1.24)
GFR calc Af Amer: 48 mL/min — ABNORMAL LOW (ref >60)
GFR calc non Af Amer: 41 mL/min — ABNORMAL LOW (ref >60)
GLUCOSE: 169 mg/dL — AB (ref 65–99)
POTASSIUM: 6.4 mmol/L — AB (ref 3.5–5.1)
SODIUM: 137 mmol/L (ref 135–145)
Total Bilirubin: 0.9 mg/dL (ref 0.3–1.2)
Total Protein: 7.3 g/dL (ref 6.5–8.1)

## 2016-01-22 LAB — URINALYSIS, ROUTINE W REFLEX MICROSCOPIC
Bilirubin Urine: NEGATIVE
Glucose, UA: 100 mg/dL — AB
Ketones, ur: NEGATIVE mg/dL
LEUKOCYTES UA: NEGATIVE
NITRITE: NEGATIVE
PH: 5.5 (ref 5.0–8.0)
Protein, ur: 30 mg/dL — AB
SPECIFIC GRAVITY, URINE: 1.017 (ref 1.005–1.030)

## 2016-01-22 LAB — BODY FLUID CELL COUNT WITH DIFFERENTIAL
Lymphs, Fluid: 64 %
MONOCYTE-MACROPHAGE-SEROUS FLUID: 32 % — AB (ref 50–90)
Neutrophil Count, Fluid: 4 % (ref 0–25)
WBC FLUID: 196 uL (ref 0–1000)

## 2016-01-22 LAB — CBC WITH DIFFERENTIAL/PLATELET
BASOS ABS: 0 10*3/uL (ref 0.0–0.1)
BASOS PCT: 1 %
EOS ABS: 0.1 10*3/uL (ref 0.0–0.7)
Eosinophils Relative: 2 %
HCT: 35.4 % — ABNORMAL LOW (ref 39.0–52.0)
HEMOGLOBIN: 11.7 g/dL — AB (ref 13.0–17.0)
Lymphocytes Relative: 22 %
Lymphs Abs: 1.4 10*3/uL (ref 0.7–4.0)
MCH: 31.7 pg (ref 26.0–34.0)
MCHC: 33.1 g/dL (ref 30.0–36.0)
MCV: 95.9 fL (ref 78.0–100.0)
MONOS PCT: 12 %
Monocytes Absolute: 0.8 10*3/uL (ref 0.1–1.0)
NEUTROS PCT: 63 %
Neutro Abs: 4 10*3/uL (ref 1.7–7.7)
Platelets: 141 10*3/uL — ABNORMAL LOW (ref 150–400)
RBC: 3.69 MIL/uL — ABNORMAL LOW (ref 4.22–5.81)
RDW: 15.8 % — AB (ref 11.5–15.5)
WBC: 6.3 10*3/uL (ref 4.0–10.5)

## 2016-01-22 LAB — GLUCOSE, CAPILLARY
GLUCOSE-CAPILLARY: 166 mg/dL — AB (ref 65–99)
GLUCOSE-CAPILLARY: 185 mg/dL — AB (ref 65–99)
Glucose-Capillary: 107 mg/dL — ABNORMAL HIGH (ref 65–99)

## 2016-01-22 LAB — LACTATE DEHYDROGENASE, PLEURAL OR PERITONEAL FLUID: LD FL: 49 U/L — AB (ref 3–23)

## 2016-01-22 LAB — URINE MICROSCOPIC-ADD ON

## 2016-01-22 LAB — PROTEIN, BODY FLUID

## 2016-01-22 LAB — TROPONIN I
TROPONIN I: 0.04 ng/mL — AB (ref <0.031)
TROPONIN I: 0.04 ng/mL — AB (ref <0.031)
TROPONIN I: 0.05 ng/mL — AB (ref <0.031)
Troponin I: 0.05 ng/mL — ABNORMAL HIGH (ref <0.031)

## 2016-01-22 LAB — PROTIME-INR
INR: 1.19 (ref 0.00–1.49)
PROTHROMBIN TIME: 14.8 s (ref 11.6–15.2)

## 2016-01-22 LAB — AMMONIA: Ammonia: 15 umol/L (ref 9–35)

## 2016-01-22 LAB — ALBUMIN, FLUID (OTHER): Albumin, Fluid: <1 g/dL

## 2016-01-22 LAB — GLUCOSE, PERITONEAL FLUID: Glucose, Peritoneal Fluid: 195 mg/dL

## 2016-01-22 LAB — LIPASE, BLOOD: Lipase: 45 U/L (ref 11–51)

## 2016-01-22 MED ORDER — FUROSEMIDE 10 MG/ML IJ SOLN
40.0000 mg | Freq: Two times a day (BID) | INTRAMUSCULAR | Status: DC
  Administered 2016-01-22 – 2016-01-23 (×4): 40 mg via INTRAVENOUS
  Filled 2016-01-22 (×4): qty 4

## 2016-01-22 MED ORDER — LACTULOSE 10 GM/15ML PO SOLN
30.0000 g | Freq: Two times a day (BID) | ORAL | Status: DC
  Administered 2016-01-22 – 2016-01-24 (×5): 30 g via ORAL
  Filled 2016-01-22 (×5): qty 45

## 2016-01-22 MED ORDER — ONDANSETRON HCL 4 MG/2ML IJ SOLN: 4.0000 mg | Freq: Three times a day (TID) | INTRAMUSCULAR | Status: DC | PRN

## 2016-01-22 MED ORDER — INSULIN ASPART 100 UNIT/ML ~~LOC~~ SOLN
10.0000 [IU] | Freq: Once | SUBCUTANEOUS | Status: AC
  Administered 2016-01-22: 10 [IU] via INTRAVENOUS
  Filled 2016-01-22: qty 1

## 2016-01-22 MED ORDER — NITROGLYCERIN 0.4 MG SL SUBL: 0.4000 mg | SUBLINGUAL_TABLET | SUBLINGUAL | Status: DC | PRN

## 2016-01-22 MED ORDER — IOHEXOL 300 MG/ML  SOLN
50.0000 mL | Freq: Once | INTRAMUSCULAR | Status: AC | PRN
  Administered 2016-01-22: 50 mL via ORAL

## 2016-01-22 MED ORDER — SODIUM CHLORIDE 0.9% FLUSH
3.0000 mL | Freq: Two times a day (BID) | INTRAVENOUS | Status: DC
  Administered 2016-01-22 – 2016-01-24 (×4): 3 mL via INTRAVENOUS

## 2016-01-22 MED ORDER — INSULIN ASPART 100 UNIT/ML ~~LOC~~ SOLN
0.0000 [IU] | Freq: Three times a day (TID) | SUBCUTANEOUS | Status: DC
  Administered 2016-01-22: 2 [IU] via SUBCUTANEOUS
  Administered 2016-01-23: 3 [IU] via SUBCUTANEOUS
  Administered 2016-01-23 (×2): 2 [IU] via SUBCUTANEOUS
  Administered 2016-01-24: 1 [IU] via SUBCUTANEOUS

## 2016-01-22 MED ORDER — SODIUM CHLORIDE 0.9% FLUSH: 3.0000 mL | INTRAVENOUS | Status: DC | PRN

## 2016-01-22 MED ORDER — MORPHINE SULFATE (PF) 2 MG/ML IV SOLN: 2.0000 mg | INTRAVENOUS | Status: DC | PRN

## 2016-01-22 MED ORDER — ACETAMINOPHEN 650 MG RE SUPP: 650.0000 mg | Freq: Four times a day (QID) | RECTAL | Status: DC | PRN

## 2016-01-22 MED ORDER — ACETAMINOPHEN 325 MG PO TABS
650.0000 mg | ORAL_TABLET | Freq: Four times a day (QID) | ORAL | Status: DC | PRN
  Administered 2016-01-23: 650 mg via ORAL
  Filled 2016-01-22: qty 2

## 2016-01-22 MED ORDER — SIMVASTATIN 40 MG PO TABS
40.0000 mg | ORAL_TABLET | Freq: Every day | ORAL | Status: DC
  Administered 2016-01-22 – 2016-01-23 (×2): 40 mg via ORAL
  Filled 2016-01-22 (×3): qty 1

## 2016-01-22 MED ORDER — INFLUENZA VAC SPLIT QUAD 0.5 ML IM SUSY
0.5000 mL | PREFILLED_SYRINGE | INTRAMUSCULAR | Status: AC
  Administered 2016-01-23: 0.5 mL via INTRAMUSCULAR
  Filled 2016-01-22 (×2): qty 0.5

## 2016-01-22 MED ORDER — LIDOCAINE-EPINEPHRINE 2 %-1:100000 IJ SOLN
20.0000 mL | Freq: Once | INTRAMUSCULAR | Status: AC
  Administered 2016-01-22: 20 mL
  Filled 2016-01-22: qty 1

## 2016-01-22 MED ORDER — SODIUM CHLORIDE 0.9% FLUSH
3.0000 mL | Freq: Two times a day (BID) | INTRAVENOUS | Status: DC
Start: 2016-01-22 — End: 2016-01-24
  Administered 2016-01-22 – 2016-01-24 (×2): 3 mL via INTRAVENOUS

## 2016-01-22 MED ORDER — SODIUM CHLORIDE 0.9 % IV SOLN: 250.0000 mL | INTRAVENOUS | Status: DC | PRN

## 2016-01-22 MED ORDER — SODIUM CHLORIDE 0.9 % IV SOLN
1.0000 g | Freq: Once | INTRAVENOUS | Status: AC
  Administered 2016-01-22: 1 g via INTRAVENOUS
  Filled 2016-01-22: qty 10

## 2016-01-22 MED ORDER — SODIUM POLYSTYRENE SULFONATE 15 GM/60ML PO SUSP
30.0000 g | Freq: Once | ORAL | Status: AC
  Administered 2016-01-22: 30 g via ORAL
  Filled 2016-01-22: qty 120

## 2016-01-22 MED ORDER — NADOLOL 40 MG PO TABS
40.0000 mg | ORAL_TABLET | Freq: Every day | ORAL | Status: DC
  Administered 2016-01-22 – 2016-01-24 (×3): 40 mg via ORAL
  Filled 2016-01-22 (×3): qty 1

## 2016-01-22 MED ORDER — PNEUMOCOCCAL VAC POLYVALENT 25 MCG/0.5ML IJ INJ
0.5000 mL | INJECTION | INTRAMUSCULAR | Status: AC
  Administered 2016-01-23: 0.5 mL via INTRAMUSCULAR
  Filled 2016-01-22 (×2): qty 0.5

## 2016-01-22 MED ORDER — DEXTROSE 50 % IV SOLN
1.0000 | Freq: Once | INTRAVENOUS | Status: AC
  Administered 2016-01-22: 50 mL via INTRAVENOUS
  Filled 2016-01-22: qty 50

## 2016-01-22 NOTE — ED Provider Notes (Signed)
Paracentesis Date/Time: 01/22/2016 3:11 AM Performed by: Antonietta Breach Authorized by: Merryl Hacker Consent: Verbal consent obtained. Written consent obtained. Risks and benefits: risks, benefits and alternatives were discussed Consent given by: patient Patient understanding: patient states understanding of the procedure being performed Patient consent: the patient's understanding of the procedure matches consent given Procedure consent: procedure consent matches procedure scheduled Relevant documents: relevant documents present and verified Test results: test results available and properly labeled Site marked: the operative site was marked Imaging studies: imaging studies available Required items: required blood products, implants, devices, and special equipment available Patient identity confirmed: verbally with patient and arm band Time out: Immediately prior to procedure a "time out" was called to verify the correct patient, procedure, equipment, support staff and site/side marked as required. Initial or subsequent exam: initial Procedure purpose: diagnostic Indications: abdominal discomfort secondary to ascites and respiratory distress secondary to ascites Anesthesia: local infiltration Local anesthetic: lidocaine 2% without epinephrine Anesthetic total: 6 ml Patient sedated: no Preparation: Patient was prepped and draped in the usual sterile fashion. Needle gauge: 18 Ultrasound guidance: yes Puncture site: right lower quadrant Fluid removed: 10(ml) Fluid characteristics: clear, straw colored. Dressing: 2x2 gauze and bandage. Patient tolerance: Patient tolerated the procedure well with no immediate complications  Antonietta Breach, PA-C 01/22/16 [place], MD 01/22/16 [phone]

## 2016-01-22 NOTE — ED Notes (Signed)
Pt up to bedside commode with limited mobility. Pt voided small amount of urine.

## 2016-01-22 NOTE — H&P (Addendum)
[facility] History and Physical  [name] YQI:[id number] DOB: 1941-09-05 DOA: 01/21/2016   PCP: Imelda Pillow, NP  Specialists: Followed by [facility]ology  Chief Complaint: Shortness of breath and abdominal discomfort  HPI: Christian Wallace is a 75 y.o. male with a past medical history of liver cirrhosis, previous alcohol use, history of hepatitis C antibody positivity, hepatitis B surface antibody and core antibody positivity, history of biliary pancreatitis, history of diabetes type 2, who is a [place] gentleman who speaks no [place]. History was provided mainly by the patient's daughter who was at the bedside. Apparently, patient has been feeling somewhat poorly over the last 2 weeks with worsening abdominal distention, shortness of breath, leg swelling. Over the last 2-3 days. He's had some confusion as well. He has also noticed abdomen to be more distended than usual. He last underwent paracentesis in December when 4 L were removed. Patient denies any fever, chills. He has had the 2 falls this past month without any syncopal episode. He does have bruising around his left eye. There is no chest pain currently. There are no headaches.  In the emergency department, patient was found to have abdominal distention. He was noted to be hyperkalemic. Troponins were minimally elevated. A diagnostic paracentesis was done. Patient will be hospitalized for further management.  Home Medications: Prior to Admission medications   Medication Sig Start Date End Date Taking? Authorizing Provider  furosemide (LASIX) 20 MG tablet Take 0.5 tablets (10 mg total) by mouth daily. 11/16/15  Yes Lori P Hvozdovic, PA-C  lactulose (CHRONULAC) 10 GM/15ML solution Take 30 ml BID 11/04/15  Yes Lori P Hvozdovic, PA-C  metFORMIN (GLUCOPHAGE) 1000 MG tablet Take 1,000 mg by mouth 2 (two) times daily.   Yes Historical Provider, MD  nadolol (CORGARD) 40 MG tablet Take 1 tablet (40 mg total) by mouth daily. 11/03/15  Yes  Lori P Hvozdovic, PA-C  simvastatin (ZOCOR) 40 MG tablet Take 40 mg by mouth daily.  05/27/12  Yes Historical Provider, MD  spironolactone (ALDACTONE) 50 MG tablet Take 0.5 tablets (25 mg total) by mouth daily. 11/16/15  Yes Lori P Hvozdovic, PA-C    Allergies: No Known Allergies  Past Medical History: Past Medical History  Diagnosis Date  . Diabetes mellitus 2014  . HTN (hypertension)   . HLD (hyperlipidemia)   . Anginal pain ([place])   . Dysphagia   . Neuromuscular disorder (HCC)     muscle pain in arms  . Cirrhosis ([place]) 05/2012    Hep C Ab +, Hep B surface Ab and core Ab +, Hep B Surface Ag -  . Biliary acute pancreatitis 05/2012    Past Surgical History  Procedure Laterality Date  . Ercp  05/22/2012    Procedure: ENDOSCOPIC RETROGRADE CHOLANGIOPANCREATOGRAPHY (ERCP);  Surgeon: Beryle Beams, MD;  Location: [facility];  Service: Endoscopy;  Laterality: N/A;  . Esophagogastroduodenoscopy N/A 10/27/2015    Procedure: ESOPHAGOGASTRODUODENOSCOPY (EGD);  Surgeon: Gatha Mayer, MD;  Location: [facility] ENDOSCOPY;  Service: Endoscopy;  Laterality: N/A;    Social History: Patient lives with family in [place]. No history of smoking currently. He used to drink alcohol in the past but none currently. No illicit drug use. Has a walker but doesn't use it on a regular basis.  Family History: Denies any health problems in the family  Review of Systems - unable to do due to confusion  Physical Examination  Filed Vitals:   01/22/16 1000 01/22/16 1002 01/22/16 1030 01/22/16 1100  BP: 115/68  107/73 111/68 107/66  Pulse: 57 59 58 56  Temp:  98.6 F (37 C)    TempSrc:  Oral    Resp: _0 Height:      SpO2: 99% 99% 99% 98%    BP 107/66 mmHg  Pulse 56  Temp(Src) 98.6 F (37 C) (Oral)  Resp 20  Ht _1  (1.676 m)  SpO2 98%  General appearance: alert, cooperative, appears stated age and no distress Head: Normocephalic, without obvious abnormality, atraumatic Eyes:  conjunctivae/corneas clear. PERRL, EOM's intact.  Throat: lips, mucosa, and tongue normal; teeth and gums normal Neck: no adenopathy, no carotid bruit, no JVD, supple, symmetrical, trachea midline and thyroid not enlarged, symmetric, no tenderness/mass/nodules Resp: Diminished air entry at the bases without any crackles, wheezing or rhonchi. Cardio: regular rate and rhythm, S1, S2 normal, no murmur, click, rub or gallop GI: Abdomen is distended with ascites. Fluid wave is present. Nontender. No masses. Liver edge is palpable. Bowel sounds are present. Extremities: 1-2+ pitting edema bilateral lower extremities Pulses: 2+ and symmetric Skin: Skin color, texture, turgor normal. No rashes or lesions Lymph nodes: Cervical, supraclavicular, and axillary nodes normal. Neurologic: Alert. Oriented to place, person. No focal neurological deficits. No asterixis.  Laboratory Data: Results for orders placed or performed during the hospital encounter of 01/21/16 (from the past 48 hour(s))  Ammonia     Status: None   Collection Time: 01/22/16 12:05 AM  Result Value Ref Range   Ammonia 15 9 - 35 umol/L  CBC with Differential     Status: Abnormal   Collection Time: 01/22/16 12:06 AM  Result Value Ref Range   WBC 6.3 4.0 - 10.5 K/uL   RBC 3.69 (L) 4.22 - 5.81 MIL/uL   Hemoglobin 11.7 (L) 13.0 - 17.0 g/dL   HCT 35.4 (L) 39.0 - 52.0 %   MCV 95.9 78.0 - 100.0 fL   MCH 31.7 26.0 - 34.0 pg   MCHC 33.1 30.0 - 36.0 g/dL   RDW 15.8 (H) 11.5 - 15.5 %   Platelets 141 (L) 150 - 400 K/uL   Neutrophils Relative % 63 %   Neutro Abs 4.0 1.7 - 7.7 K/uL   Lymphocytes Relative 22 %   Lymphs Abs 1.4 0.7 - 4.0 K/uL   Monocytes Relative 12 %   Monocytes Absolute 0.8 0.1 - 1.0 K/uL   Eosinophils Relative 2 %   Eosinophils Absolute 0.1 0.0 - 0.7 K/uL   Basophils Relative 1 %   Basophils Absolute 0.0 0.0 - 0.1 K/uL  Comprehensive metabolic panel     Status: Abnormal   Collection Time: 01/22/16 12:06 AM  Result  Value Ref Range   Sodium 137 135 - 145 mmol/L   Potassium 6.4 (HH) 3.5 - 5.1 mmol/L    Comment: NO VISIBLE HEMOLYSIS CRITICAL RESULT CALLED TO, READ BACK BY AND VERIFIED WITH: R.COOPER,RN AT 0057 ON 01/22/16 BY W.SHEA    Chloride 109 101 - 111 mmol/L   CO2 23 22 - 32 mmol/L   Glucose, Bld 169 (H) 65 - 99 mg/dL   BUN 24 (H) 6 - 20 mg/dL   Creatinine, Ser 1.56 (H) 0.61 - 1.24 mg/dL   Calcium 8.6 (L) 8.9 - 10.3 mg/dL   Total Protein 7.3 6.5 - 8.1 g/dL   Albumin 2.4 (L) 3.5 - 5.0 g/dL   AST 54 (H) 15 - 41 U/L   ALT 36 17 - 63 U/L   Alkaline Phosphatase 271 (H) 38 - 126 U/L  Total Bilirubin 0.9 0.3 - 1.2 mg/dL   GFR calc non Af Amer 41 (L) >60 mL/min   GFR calc Af Amer 48 (L) >60 mL/min    Comment: (NOTE) The eGFR has been calculated using the CKD EPI equation. This calculation has not been validated in all clinical situations. eGFR's persistently <60 mL/min signify possible Chronic Kidney Disease.    Anion gap 5 5 - 15  Lipase, blood     Status: None   Collection Time: 01/22/16 12:06 AM  Result Value Ref Range   Lipase 45 11 - 51 U/L  Protime-INR     Status: None   Collection Time: 01/22/16 12:06 AM  Result Value Ref Range   Prothrombin Time 14.8 11.6 - 15.2 seconds   INR 1.19 0.00 - 1.49  Troponin I     Status: Abnormal   Collection Time: 01/22/16 12:15 AM  Result Value Ref Range   Troponin I 0.05 (H) <0.031 ng/mL    Comment:        PERSISTENTLY INCREASED TROPONIN VALUES IN THE RANGE OF 0.04-0.49 ng/mL CAN BE SEEN IN:       -UNSTABLE ANGINA       -CONGESTIVE HEART FAILURE       -MYOCARDITIS       -CHEST TRAUMA       -ARRYHTHMIAS       -LATE PRESENTING MYOCARDIAL INFARCTION       -COPD   CLINICAL FOLLOW-UP RECOMMENDED.   Urinalysis, Routine w reflex microscopic (not at [facility])     Status: Abnormal   Collection Time: 01/22/16  1:01 AM  Result Value Ref Range   Color, Urine YELLOW YELLOW   APPearance CLEAR CLEAR   Specific Gravity, Urine 1.017 1.005 - 1.030   pH  5.5 5.0 - 8.0   Glucose, UA 100 (A) NEGATIVE mg/dL   Hgb urine dipstick TRACE (A) NEGATIVE   Bilirubin Urine NEGATIVE NEGATIVE   Ketones, ur NEGATIVE NEGATIVE mg/dL   Protein, ur 30 (A) NEGATIVE mg/dL   Nitrite NEGATIVE NEGATIVE   Leukocytes, UA NEGATIVE NEGATIVE  Urine microscopic-add on     Status: Abnormal   Collection Time: 01/22/16  1:01 AM  Result Value Ref Range   Squamous Epithelial / LPF 0-5 (A) NONE SEEN   WBC, UA 0-5 0 - 5 WBC/hpf   RBC / HPF 0-5 0 - 5 RBC/hpf   Bacteria, UA RARE (A) NONE SEEN   Casts HYALINE CASTS (A) NEGATIVE  Lactate dehydrogenase, Peritoneal fluid     Status: Abnormal   Collection Time: 01/22/16  3:10 AM  Result Value Ref Range   LD, Fluid 49 (H) 3 - 23 U/L    Comment: (NOTE) Results should be evaluated in conjunction with serum values Performed at [facility]    Fluid Type-FLDH PERITONEAL CAVITY   Glucose, Peritoneal fluid     Status: None   Collection Time: 01/22/16  3:10 AM  Result Value Ref Range   Glucose, Peritoneal Fluid 195 mg/dL    Comment: NO NORMAL RANGE ESTABLISHED FOR THIS TEST Performed at [facility]   Protein, Peritoneal fluid     Status: None   Collection Time: 01/22/16  3:10 AM  Result Value Ref Range   Total protein, fluid <3.0 g/dL    Comment: (NOTE) No normal range established for this test Results should be evaluated in conjunction with serum values Performed at [facility]    Fluid Type-FTP PERITONEAL CAVITY   Albumin, Peritoneal fluid  Status: None   Collection Time: 01/22/16  3:10 AM  Result Value Ref Range   Albumin, Fluid <1.0 g/dL    Comment: (NOTE) No normal range established for this test Results should be evaluated in conjunction with serum values Performed at [facility]    Fluid Type-FALB PERITONEAL CAVITY   Body fluid cell count with differential     Status: Abnormal   Collection Time: 01/22/16  3:10 AM  Result Value Ref Range   Fluid Type-FCT PERITONEAL CAVITY      Color, Fluid YELLOW YELLOW   Appearance, Fluid CLEAR CLEAR   WBC, Fluid 196 0 - 1000 cu mm   Neutrophil Count, Fluid 4 0 - 25 %   Lymphs, Fluid 64 %   Monocyte-Macrophage-Serous Fluid 32 (L) 50 - 90 %  Troponin I (q 6hr x 3)     Status: Abnormal   Collection Time: 01/22/16  4:10 AM  Result Value Ref Range   Troponin I 0.04 (H) <0.031 ng/mL    Comment:        PERSISTENTLY INCREASED TROPONIN VALUES IN THE RANGE OF 0.04-0.49 ng/mL CAN BE SEEN IN:       -UNSTABLE ANGINA       -CONGESTIVE HEART FAILURE       -MYOCARDITIS       -CHEST TRAUMA       -ARRYHTHMIAS       -LATE PRESENTING MYOCARDIAL INFARCTION       -COPD   CLINICAL FOLLOW-UP RECOMMENDED.   Basic metabolic panel     Status: Abnormal   Collection Time: 01/22/16  8:37 AM  Result Value Ref Range   Sodium 136 135 - 145 mmol/L   Potassium 5.2 (H) 3.5 - 5.1 mmol/L    Comment: NO VISIBLE HEMOLYSIS DELTA CHECK NOTED    Chloride 110 101 - 111 mmol/L   CO2 21 (L) 22 - 32 mmol/L   Glucose, Bld 105 (H) 65 - 99 mg/dL   BUN 24 (H) 6 - 20 mg/dL   Creatinine, Ser 1.54 (H) 0.61 - 1.24 mg/dL   Calcium 8.5 (L) 8.9 - 10.3 mg/dL   GFR calc non Af Amer 42 (L) >60 mL/min   GFR calc Af Amer 49 (L) >60 mL/min    Comment: (NOTE) The eGFR has been calculated using the CKD EPI equation. This calculation has not been validated in all clinical situations. eGFR's persistently <60 mL/min signify possible Chronic Kidney Disease.    Anion gap 5 5 - 15    Radiology Reports: Ct Abdomen Pelvis Wo Contrast  01/22/2016  CLINICAL DATA:  Acute onset of generalized abdominal distention. Ascites. Initial encounter. EXAM: CT ABDOMEN AND PELVIS WITHOUT CONTRAST TECHNIQUE: Multidetector CT imaging of the abdomen and pelvis was performed following the standard protocol without IV contrast. COMPARISON:  CT of the abdomen and pelvis performed 10/26/2015 FINDINGS: Trace bilateral pleural fluid is noted, with bibasilar atelectasis or scarring. Diffuse  coronary artery calcifications are seen. A large volume of ascites is noted within the abdomen and pelvis. The diffusely nodular contour of the liver is compatible with hepatic cirrhosis. The spleen is unremarkable in appearance. The gallbladder is not well assessed, though it appears to contain several large stones. The pancreas and adrenal glands are unremarkable. The kidneys are unremarkable in appearance. There is no evidence of hydronephrosis. No renal or ureteral stones are seen. No perinephric stranding is appreciated. No free fluid is identified. The small bowel is unremarkable in appearance. The stomach is within normal  limits. No acute vascular abnormalities are seen. Scattered calcification is noted along the abdominal aorta and its branches. The appendix is normal in caliber and contains air, without evidence of appendicitis. The colon is unremarkable in appearance. The bladder is mildly distended and grossly unremarkable. The prostate remains normal in size. No inguinal lymphadenopathy is seen. No acute osseous abnormalities are identified. IMPRESSION: 1. Large volume ascites within the abdomen and pelvis. 2. Changes of hepatic cirrhosis noted. 3. Cholelithiasis. Gallbladder otherwise unremarkable, though not well assessed. 4. Scattered calcification along the abdominal aorta and its branches. 5. Trace bilateral pleural fluid, with bibasilar atelectasis or scarring. 6. Diffuse coronary artery calcifications seen. Electronically Signed   By: Garald Balding M.D.   On: 01/22/2016 03:39   Dg Chest 2 View  01/22/2016  CLINICAL DATA:  Shortness of breath for 2 weeks, associated abdominal distention. History of diabetes, hypertension and cirrhosis. EXAM: CHEST  2 VIEW COMPARISON:  Chest radiograph November 27, 2013 FINDINGS: Cardiac silhouette is upper limits of normal flex, calcified aortic knob. Low inspiratory examination with bibasilar strandy densities. No pleural effusion focal consolidation. No  pneumothorax. Distended abdomen with hazy density. Acute appearing nondisplaced LEFT posterior ninth rib fracture. IMPRESSION: Borderline cardiomegaly and bibasilar atelectasis in this low inspiratory examination. Hazy distended abdomen suggests ascites, partially imaged. Acute LEFT posterior ninth rib fracture. Electronically Signed   By: Elon Alas M.D.   On: 01/22/2016 00:41   Ct Head Wo Contrast  01/22/2016  CLINICAL DATA:  Acute onset of worsening confusion. Initial encounter. EXAM: CT HEAD WITHOUT CONTRAST TECHNIQUE: Contiguous axial images were obtained from the base of the skull through the vertex without intravenous contrast. COMPARISON:  CT of the head performed 11/09/2010 FINDINGS: There is no evidence of acute infarction, mass lesion, or intra- or extra-axial hemorrhage on CT. Prominence of the ventricles and sulci reflects moderate cortical volume loss, more prominent than on the prior study. Cerebellar atrophy is noted. Relatively diffuse periventricular and subcortical white matter change likely reflects small vessel ischemic microangiopathy. Small chronic lacunar infarcts are noted at the right basal ganglia. A small infarct is noted at the right occipital lobe, with associated encephalomalacia. The brainstem and fourth ventricle are within normal limits. No mass effect or midline shift is seen. There is no evidence of fracture; visualized osseous structures are unremarkable in appearance. The visualized portions of the orbits are within normal limits. The paranasal sinuses and mastoid air cells are well-aerated. Soft tissue swelling is noted lateral to the left orbit and zygomatic arch. IMPRESSION: 1. No acute intracranial pathology seen on CT. 2. Soft tissue swelling noted lateral to the left orbit and zygomatic arch. 3. Moderate cortical volume loss and diffuse small vessel ischemic microangiopathy. 4. 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 Small infarct at the right  occipital lobe, with associated encephalomalacia. Electronically Signed   By: Garald Balding M.D.   On: 01/22/2016 00:32    My interpretation of Electrocardiogram: Sinus rhythm with heart rate in the 50s. Left axis deviation. T wave inversion in 1 aVL. Nonspecific ST changes. Similar to previous EKG.  Problem List  Principal Problem:   Hyperkalemia Active Problems:   HTN (hypertension)   Diabetes mellitus (HCC)   Cirrhosis ([place])   Cirrhosis of liver with ascites (HCC)   Esophageal varices in alcoholic cirrhosis (HCC)   Acute encephalopathy   Dyspnea   Assessment: This is a 75 year old [place] gentleman who speaks no English who has a history of liver cirrhosis with  esophageal varices who comes in with abdominal distention and dyspnea and confusion. He did undergo diagnostic paracentesis in the ED, which does not suggest SBP. He was noted to be significantly hyperkalemic. He does have significant ascites and lower extremity edema.  Plan: #1 Hyperkalemia: Patient was given Kayexalate, calcium gluconate. Potassium has improved to 5.2. Renal function is close to baseline. Repeat tomorrow morning. Stop the spironolactone for now.  #2 History of liver cirrhosis with ascites: It looks like he has reaccumulated fluid in his abdominal cavity. CT scan does show significant ascites. He will need therapeutic paracentesis to relieve his symptoms. Diagnostic tap performed by the ED physician does not suggest his BP. Hold off on antibiotics. He is followed closely by gastroenterology as an outpatient. He does have as esophageal varices. However, previous attempts at banding have not been successful. No evidence for active bleeding currently. His ammonia level is normal. Continue with lactulose. He does not appear to be particularly encephalopathic at this time. He will be given intravenous Lasix. Hold his spironolactone due to hyperkalemia. Continue Nadolol.  #3 dyspnea with mildly elevated troponin:  Dyspnea has most likely secondary to ascites. The reason for elevated troponin is not entirely clear. He denies any chest pain. EKG does not show any new changes. Continue to trend troponin. Echocardiogram will be ordered.  #4 Frequent falls: PT and OT will be consulted. He does have bruising around his left eye. CT head did not show any acute changes. No focal deficits appreciated.  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  #6 Incidental cholelithiasis: Outpatient follow-up.  #7. Diabetes mellitus type 2: Sliding scale insulin coverage. Hold metformin.   DVT Prophylaxis: SCDs due to high risk of bleeding Code Status: Full code Family Communication: Discussed with the patient's daughter  Disposition Plan: Admit and pursue management as outlined above.   Further management decisions will depend on results of further testing and patient's response to treatment.   [facility]  [facility] Pager [phone]  If 7PM-7AM, please contact night-coverage [email or web address] Password [facility]  01/22/2016, 11:20 AM

## 2016-01-22 NOTE — Progress Notes (Signed)
This is a no charge note.   Pending on admission per Dr. 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   Patient is found to have potassium 6.4 without T-wave thickening, WBC 6.3, temperature normal, no tachycardia, creatinine 1.56, troponin 0.05, patient does not have any chest pain per ED physician. EKG showed T-wave inversion in lead 1/aVL, anteroseptal infarction pattern, Q-wave in 3/aVF, ST-elevation in one of two beats in V2 only. CT-abdomen/pelvis showed large volume ascites within the abdomen and pelvis, otherwise not impressive. Diagnostic paracentesis was done by Dr. Dina Rich, pending results. Hyperkalemia was treated by EDP. Pt is accepted to tele bed.   Ivor Costa, MD  [facility] Pager [phone]  If 7PM-7AM, please contact night-coverage [email or web address] Password [email or web address] 01/22/2016, 3:56 AM

## 2016-01-22 NOTE — Progress Notes (Signed)
Utilization review completed.  

## 2016-01-22 NOTE — ED Notes (Signed)
Pt request for repeat troponin to be drawn off IV, will consult w/ RN

## 2016-01-22 NOTE — ED Notes (Signed)
Pt refused pain medicine and breakfast tray.

## 2016-01-23 ENCOUNTER — Inpatient Hospital Stay (HOSPITAL_COMMUNITY): Payer: Medicare Other

## 2016-01-23 ENCOUNTER — Encounter (HOSPITAL_COMMUNITY): Payer: Self-pay | Admitting: Internal Medicine

## 2016-01-23 DIAGNOSIS — S2232XD Fracture of one rib, left side, subsequent encounter for fracture with routine healing: Secondary | ICD-10-CM

## 2016-01-23 DIAGNOSIS — D696 Thrombocytopenia, unspecified: Secondary | ICD-10-CM

## 2016-01-23 DIAGNOSIS — S0990XA Unspecified injury of head, initial encounter: Secondary | ICD-10-CM | POA: Diagnosis present

## 2016-01-23 DIAGNOSIS — I2584 Coronary atherosclerosis due to calcified coronary lesion: Secondary | ICD-10-CM

## 2016-01-23 DIAGNOSIS — N179 Acute kidney failure, unspecified: Secondary | ICD-10-CM

## 2016-01-23 DIAGNOSIS — N183 Chronic kidney disease, stage 3 unspecified: Secondary | ICD-10-CM

## 2016-01-23 DIAGNOSIS — G9340 Encephalopathy, unspecified: Secondary | ICD-10-CM

## 2016-01-23 DIAGNOSIS — K7030 Alcoholic cirrhosis of liver without ascites: Secondary | ICD-10-CM

## 2016-01-23 DIAGNOSIS — R0600 Dyspnea, unspecified: Secondary | ICD-10-CM

## 2016-01-23 DIAGNOSIS — E1159 Type 2 diabetes mellitus with other circulatory complications: Secondary | ICD-10-CM

## 2016-01-23 DIAGNOSIS — S2232XA Fracture of one rib, left side, initial encounter for closed fracture: Secondary | ICD-10-CM | POA: Diagnosis present

## 2016-01-23 DIAGNOSIS — K7031 Alcoholic cirrhosis of liver with ascites: Secondary | ICD-10-CM | POA: Diagnosis not present

## 2016-01-23 DIAGNOSIS — I6350 Cerebral infarction due to unspecified occlusion or stenosis of unspecified cerebral artery: Secondary | ICD-10-CM

## 2016-01-23 DIAGNOSIS — G9389 Other specified disorders of brain: Secondary | ICD-10-CM

## 2016-01-23 DIAGNOSIS — I679 Cerebrovascular disease, unspecified: Secondary | ICD-10-CM

## 2016-01-23 DIAGNOSIS — I8510 Secondary esophageal varices without bleeding: Secondary | ICD-10-CM

## 2016-01-23 DIAGNOSIS — D649 Anemia, unspecified: Secondary | ICD-10-CM

## 2016-01-23 DIAGNOSIS — Z8673 Personal history of transient ischemic attack (TIA), and cerebral infarction without residual deficits: Secondary | ICD-10-CM

## 2016-01-23 DIAGNOSIS — I700 Atherosclerosis of aorta: Secondary | ICD-10-CM

## 2016-01-23 DIAGNOSIS — I639 Cerebral infarction, unspecified: Secondary | ICD-10-CM

## 2016-01-23 DIAGNOSIS — I2510 Atherosclerotic heart disease of native coronary artery without angina pectoris: Secondary | ICD-10-CM

## 2016-01-23 HISTORY — DX: Personal history of transient ischemic attack (TIA), and cerebral infarction without residual deficits: Z86.73

## 2016-01-23 HISTORY — DX: Thrombocytopenia, unspecified: D69.6

## 2016-01-23 HISTORY — DX: Chronic kidney disease, stage 3 unspecified: N18.30

## 2016-01-23 HISTORY — DX: Atherosclerosis of aorta: I70.0

## 2016-01-23 HISTORY — DX: Anemia, unspecified: D64.9

## 2016-01-23 HISTORY — DX: Other specified disorders of brain: G93.89

## 2016-01-23 HISTORY — DX: Atherosclerotic heart disease of native coronary artery without angina pectoris: I25.10

## 2016-01-23 LAB — AMMONIA: AMMONIA: 45 umol/L — AB (ref 9–35)

## 2016-01-23 LAB — BASIC METABOLIC PANEL WITH GFR
Anion gap: 8 (ref 5–15)
BUN: 28 mg/dL — ABNORMAL HIGH (ref 6–20)
CO2: 21 mmol/L — ABNORMAL LOW (ref 22–32)
Calcium: 8.5 mg/dL — ABNORMAL LOW (ref 8.9–10.3)
Chloride: 109 mmol/L (ref 101–111)
Creatinine, Ser: 1.79 mg/dL — ABNORMAL HIGH (ref 0.61–1.24)
GFR calc Af Amer: 41 mL/min — ABNORMAL LOW
GFR calc non Af Amer: 35 mL/min — ABNORMAL LOW
Glucose, Bld: 197 mg/dL — ABNORMAL HIGH (ref 65–99)
Potassium: 5 mmol/L (ref 3.5–5.1)
Sodium: 138 mmol/L (ref 135–145)

## 2016-01-23 LAB — CBC
HCT: 31.7 % — ABNORMAL LOW (ref 39.0–52.0)
Hemoglobin: 10.6 g/dL — ABNORMAL LOW (ref 13.0–17.0)
MCH: 31.6 pg (ref 26.0–34.0)
MCHC: 33.4 g/dL (ref 30.0–36.0)
MCV: 94.6 fL (ref 78.0–100.0)
Platelets: 118 K/uL — ABNORMAL LOW (ref 150–400)
RBC: 3.35 MIL/uL — ABNORMAL LOW (ref 4.22–5.81)
RDW: 15.9 % — ABNORMAL HIGH (ref 11.5–15.5)
WBC: 5.3 K/uL (ref 4.0–10.5)

## 2016-01-23 LAB — GLUCOSE, CAPILLARY
GLUCOSE-CAPILLARY: 102 mg/dL — AB (ref 65–99)
Glucose-Capillary: 154 mg/dL — ABNORMAL HIGH (ref 65–99)
Glucose-Capillary: 189 mg/dL — ABNORMAL HIGH (ref 65–99)
Glucose-Capillary: 237 mg/dL — ABNORMAL HIGH (ref 65–99)

## 2016-01-23 LAB — PATHOLOGIST SMEAR REVIEW

## 2016-01-23 MED ORDER — ASPIRIN 81 MG PO CHEW
81.0000 mg | CHEWABLE_TABLET | Freq: Every day | ORAL | Status: DC
  Administered 2016-01-23 – 2016-01-24 (×2): 81 mg via ORAL
  Filled 2016-01-23 (×2): qty 1

## 2016-01-23 NOTE — Progress Notes (Signed)
Echocardiogram 2D Echocardiogram has been performed.  Tresa Res 01/23/2016, 2:00 PM

## 2016-01-23 NOTE — Progress Notes (Signed)
Progress Note   Christian Wallace MR:[id number] DOB: 08/01/1941 DOA: 01/21/2016 PCP: Imelda Pillow, NP   Brief Narrative:   Christian Wallace is an 75 y.o. 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 He also had a 2-3 day history of worsening confusion. In the emergency department, patient was found to have abdominal distention. He was noted to be hyperkalemic. Troponins were minimally elevated. A diagnostic paracentesis was done.  Assessment/Plan:   Principal Problem:   Hyperkalemia, likely secondary to treatment with spironolactone - Patient was given Kayexalate and calcium gluconate. Spironolactone on hold. - Potassium 5.0 this morning.  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 - Speech therapy evaluation given complaints of dysphasia. PT/OT evaluations. - Start aspirin therapy.    Acute left ninth rib fracture/head trauma - Recent falls at home. PT/OT evaluations requested.    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 No signs of bleeding.    Normocytic anemia - Likely anemia of chronic disease. Monitor hemoglobin.    Acute kidney injury/stage III chronic kidney disease - Baseline creatinine appears to be around 1.5. Current creatinine elevated over usual baseline values. - Hold spironolactone and monitor.    HTN (hypertension) - Continue Lasix. Continue nadolol. Hold spironolactone.    Diabetes mellitus ([place]) - Metformin on hold. Currently being managed with insulin sensitive SSI 3 times a day. 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 - Ammonia level was normal on admission. Continue lactulose. - Therapeutic paracentesis requested for relief of symptoms. - Continue Lasix. Continue nadolol.    Acute encephalopathy with frequent falls - Ammonia level WNL, continue lactulose. - CT of the head negative for acute findings. - PT/OT consultations requested.    Dyspnea/elevated troponin/coronary artery calcifications/aortic artery calcifications - No complaints of chest pain. EKG without acute changes. Troponins mildly elevated. - 2-D echo showed EF 55-60 percent with grade 1 diastolic dysfunction and no regional wall motion abnormalities.    Incidental cholelithiasis - Outpatient follow-up unless he becomes symptomatic.    Hyperlipidemia - Continue Zocor.    DVT Prophylaxis - SCDs ordered.   Family Communication/Anticipated D/C date and plan/Code Status   Family Communication: Family updated at bedside. Disposition Plan/date: Home versus SNF when cardiac workup completed and the patient has had PT/OT evaluations for disposition. Code Status: Full code.   IV Access:    Peripheral IV   Procedures and diagnostic studies:   Ct Abdomen Pelvis Wo Contrast  01/22/2016  CLINICAL DATA:  Acute onset of generalized abdominal distention. Ascites. Initial encounter. EXAM: CT ABDOMEN AND PELVIS WITHOUT CONTRAST TECHNIQUE: Multidetector CT imaging of the abdomen and pelvis was performed following the standard protocol without IV contrast. COMPARISON:  CT of the abdomen and pelvis performed 10/26/2015 FINDINGS: Trace bilateral pleural fluid is noted, with bibasilar atelectasis or scarring. Diffuse coronary artery calcifications are seen. A large volume of ascites is noted within the abdomen and pelvis. The diffusely nodular contour of the liver is compatible with hepatic cirrhosis. The spleen  is unremarkable in appearance. The gallbladder is  not well assessed, though it appears to contain several large stones. The pancreas and adrenal glands are unremarkable. The kidneys are unremarkable in appearance. There is no evidence of hydronephrosis. No renal or ureteral stones are seen. No perinephric stranding is appreciated. No free fluid is identified. The small bowel is unremarkable in appearance. The stomach is within normal limits. No acute vascular abnormalities are seen. Scattered calcification is noted along the abdominal aorta and its branches. The appendix is normal in caliber and contains air, without evidence of appendicitis. The colon is unremarkable in appearance. The bladder is mildly distended and grossly unremarkable. The prostate remains normal in size. No inguinal lymphadenopathy is seen. No acute osseous abnormalities are identified. IMPRESSION: 1. Large volume ascites within the abdomen and pelvis. 2. Changes of hepatic cirrhosis noted. 3. Cholelithiasis. Gallbladder otherwise unremarkable, though not well assessed. 4. Scattered calcification along the abdominal aorta and its branches. 5. Trace bilateral pleural fluid, with bibasilar atelectasis or scarring. 6. Diffuse coronary artery calcifications seen. Electronically Signed   By: Garald Balding M.D.   On: 01/22/2016 03:39   Dg Chest 2 View  01/22/2016  CLINICAL DATA:  Shortness of breath for 2 weeks, associated abdominal distention. History of diabetes, hypertension and cirrhosis. EXAM: CHEST  2 VIEW COMPARISON:  Chest radiograph November 27, 2013 FINDINGS: Cardiac silhouette is upper limits of normal flex, calcified aortic knob. Low inspiratory examination with bibasilar strandy densities. No pleural effusion focal consolidation. No pneumothorax. Distended abdomen with hazy density. Acute appearing nondisplaced LEFT posterior ninth rib fracture. IMPRESSION: Borderline cardiomegaly and bibasilar atelectasis in this low inspiratory  examination. Hazy distended abdomen suggests ascites, partially imaged. Acute LEFT posterior ninth rib fracture. Electronically Signed   By: Elon Alas M.D.   On: 01/22/2016 00:41   Ct Head Wo Contrast  01/22/2016  CLINICAL DATA:  Acute onset of worsening confusion. Initial encounter. EXAM: CT HEAD WITHOUT CONTRAST TECHNIQUE: Contiguous axial images were obtained from the base of the skull through the vertex without intravenous contrast. COMPARISON:  CT of the head performed 11/09/2010 FINDINGS: There is no evidence of acute infarction, mass lesion, or intra- or extra-axial hemorrhage on CT. Prominence of the ventricles and sulci reflects moderate cortical volume loss, more prominent than on the prior study. Cerebellar atrophy is noted. Relatively diffuse periventricular and subcortical white matter change likely reflects small vessel ischemic microangiopathy. Small chronic lacunar infarcts are noted at the right basal ganglia. A small infarct is noted at the right occipital lobe, with associated encephalomalacia. The brainstem and fourth ventricle are within normal limits. No mass effect or midline shift is seen. There is no evidence of fracture; visualized osseous structures are unremarkable in appearance. The visualized portions of the orbits are within normal limits. The paranasal sinuses and mastoid air cells are well-aerated. Soft tissue swelling is noted lateral to the left orbit and zygomatic arch. IMPRESSION: 1. No acute intracranial pathology seen on CT. 2. Soft tissue swelling noted lateral to the left orbit and zygomatic arch. 3. Moderate cortical volume loss and diffuse small vessel ischemic microangiopathy. 4. 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 Small infarct at the right occipital lobe, with associated encephalomalacia. Electronically Signed   By: Garald Balding M.D.   On: 01/22/2016 00:32     Medical Consultants:    None.  Anti-Infectives:   Anti-infectives     None      Subjective:   Christian Wallace tells me he feels weak. There has been no nausea or  vomiting. No complaints of pain. He feels better after having almost 5 L of fluid removed during paracentesis. Dyspnea better.  Objective:    Filed Vitals:   01/22/16 1100 01/22/16 1215 01/22/16 2147 01/23/16 0542  BP: 107/66 131/86 129/67 123/70  Pulse: 56 56 69 62  Temp:  98.2 F (36.8 C) 99.4 F (37.4 C) 97.8 F (36.6 C)  TempSrc:  Oral Oral Oral  Resp: 20 16 16 16   Height:  5\' 5"  (1.651 m)    Weight:  53.5 kg (117 lb 15.1 oz)  53.7 kg (118 lb 6.2 oz)  SpO2: 98% 100% 99% 100%   No intake or output data in the 24 hours ending 01/23/16 0816 Filed Weights   01/22/16 1215 01/23/16 0542  Weight: 53.5 kg (117 lb 15.1 oz) 53.7 kg (118 lb 6.2 oz)    Exam: Gen:  NAD, thin Cardiovascular:  RRR, No M/R/G Respiratory:  Lungs diminished Gastrointestinal:  Abdomen soft, NT/ND, + BS Extremities:  1-2+ pitting edema   Data Reviewed:    Labs: Basic Metabolic Panel:  Recent Labs Lab 01/22/16 0006 01/22/16 0837 01/23/16 0509  NA 137 136 138  K 6.4* 5.2* 5.0  CL 109 110 109  CO2 23 21* 21*  GLUCOSE 169* 105* 197*  BUN 24* 24* 28*  CREATININE 1.56* 1.54* 1.79*  CALCIUM 8.6* 8.5* 8.5*   GFR Estimated Creatinine Clearance: 26.7 mL/min (by C-G formula based on Cr of 1.79). Liver Function Tests:  Recent Labs Lab 01/22/16 0006  AST 54*  ALT 36  ALKPHOS 271*  BILITOT 0.9  PROT 7.3  ALBUMIN 2.4*    Recent Labs Lab 01/22/16 0006  LIPASE 45    Recent Labs Lab 01/22/16 0005 01/23/16 0509  AMMONIA 15 45*   Coagulation profile  Recent Labs Lab 01/22/16 0006  INR 1.19    CBC:  Recent Labs Lab 01/22/16 0006 01/23/16 0509  WBC 6.3 5.3  NEUTROABS 4.0  --   HGB 11.7* 10.6*  HCT 35.4* 31.7*  MCV 95.9 94.6  PLT 141* 118*   Cardiac Enzymes:  Recent Labs Lab 01/22/16 0015 01/22/16 0410 01/22/16 1021 01/22/16 1625  TROPONINI 0.05* 0.04* 0.05* 0.04*    CBG:  Recent Labs Lab 01/22/16 1348 01/22/16 1831 01/22/16 2150 01/23/16 0727  GLUCAP 107* 166* 185* 154*   Sepsis Labs:  Recent Labs Lab 01/22/16 0006 01/23/16 0509  WBC 6.3 5.3   Microbiology Recent Results (from the past 240 hour(s))  Body fluid culture     Status: None (Preliminary result)   Collection Time: 01/22/16  3:10 AM  Result Value Ref Range Status   Specimen Description PERITONEAL CAVITY  Final   Special Requests NONE  Final   Gram Stain   Final    FEW WBC PRESENT, PREDOMINANTLY MONONUCLEAR NO ORGANISMS SEEN Performed at [facility]    Culture PENDING  Incomplete   Report Status PENDING  Incomplete     Medications:   . furosemide  40 mg Intravenous Q12H  . Influenza vac split quadrivalent PF  0.5 mL Intramuscular Tomorrow-1000  . insulin aspart  0-9 Units Subcutaneous TID WC  . lactulose  30 g Oral BID  . nadolol  40 mg Oral Daily  . pneumococcal 23 valent vaccine  0.5 mL Intramuscular Tomorrow-1000  . simvastatin  40 mg Oral Daily  . sodium chloride flush  3 mL Intravenous Q12H  . sodium chloride flush  3 mL Intravenous Q12H   Continuous Infusions:   Time spent: 35  minutes.  The patient is medically complex with multiple co-morbidities and is at high risk for clinical deterioration and requires high complexity decision making.    LOS: 1 day   RAMA,CHRISTINA  [facility] Pager [phone]. If unable to reach me by pager, please call my cell phone at [phone].  *Please refer to [email or web address], password [email or web address] to get updated schedule on who will round on this patient, as hospitalists switch teams weekly. If 7PM-7AM, please contact night-coverage at [email or web address], password [email or web address] for any overnight needs.  01/23/2016, 8:16 AM

## 2016-01-23 NOTE — Progress Notes (Signed)
OT Cancellation Note  Patient Details Name: Lyell Tweten MRN: [id number] DOB: 1940/12/15   Cancelled Treatment:    Reason Eval/Treat Not Completed: Patient at procedure or test/ unavailable  Will recheck on pt later in day or next day   [place], Thereasa Parkin 01/23/2016, 1:43 PM

## 2016-01-23 NOTE — Evaluation (Signed)
Physical Therapy Evaluation Patient Details Name: Christian Wallace MRN: [id number] DOB: 02/23/1941 Today's Date: 01/23/2016   History of Present Illness  75 y.o. male with h/o EtOH, Hepatitis C and B, DM2 admitted with SOB, LE swelling, abdominal distension, 2 falls in past month.   Clinical Impression  Pt admitted with above diagnosis. Pt currently with functional limitations due to the deficits listed below (see PT Problem List). Mod assist for bed mobility, Min A for balance to ambulate 50' with RW. May need ST-SNF.  Pt will benefit from skilled PT to increase their independence and safety with mobility to allow discharge to the venue listed below.       Follow Up Recommendations SNF (May need ST-SNF if family not able to provide 24* assist and pt requires assist for mobility at time of DC. )    Equipment Recommendations  Rolling walker with 5" wheels    Recommendations for Other Services       Precautions / Restrictions Precautions Precautions: Fall Precaution Comments: 2 falls in past month per chart Restrictions Weight Bearing Restrictions: No      Mobility  Bed Mobility Overal bed mobility: Needs Assistance Bed Mobility: Supine to Sit     Supine to sit: Mod assist     General bed mobility comments: assist to raise trunk  Transfers Overall transfer level: Needs assistance Equipment used: Rolling walker (2 wheeled) Transfers: Sit to/from Stand Sit to Stand: Mod assist         General transfer comment: mod A to rise  Ambulation/Gait Ambulation/Gait assistance: Min assist;Min guard Ambulation Distance (Feet): 80 Feet Assistive device: Rolling walker (2 wheeled) Gait Pattern/deviations: Step-through pattern;Trunk flexed   Gait velocity interpretation: Below normal speed for age/gender General Gait Details: verbal cues for positioning in RW and to increase step length, min/guard to min A for balance, distance limited by fatigue  Stairs            Wheelchair  Mobility    Modified Rankin (Stroke Patients Only)       Balance Overall balance assessment: Needs assistance   Sitting balance-Leahy Scale: Good       Standing balance-Leahy Scale: Poor                               Pertinent Vitals/Pain Pain Assessment: 0-10 Pain Score: 7  Pain Location: BLEs Pain Descriptors / Indicators: Sore Pain Intervention(s): Limited activity within patient's tolerance;Monitored during session;Patient requesting pain meds-RN notified    Home Living Family/patient expects to be discharged to:: Private residence Living Arrangements: Alone Available Help at Discharge: Family;Available PRN/intermittently Type of Home: House Home Access: Stairs to enter   Entrance Stairs-Number of Steps: 2 Home Layout: One level Home Equipment: None      Prior Function                 Hand Dominance        Extremity/Trunk Assessment   Upper Extremity Assessment: Defer to OT evaluation           Lower Extremity Assessment: Generalized weakness (knee ext 4/5 B, sensation intact to light touch B feet)      Cervical / Trunk Assessment: Normal  Communication   Communication: Interpreter utilized;Prefers language other than English  Cognition Arousal/Alertness: Awake/alert Behavior During Therapy: WFL for tasks assessed/performed Overall Cognitive Status: No family/caregiver present to determine baseline cognitive functioning (pt denied h/o falls, chart reported 2 falls in past month;  pt able to follow commands)                      General Comments      Exercises        Assessment/Plan    PT Assessment Patient needs continued PT services  PT Diagnosis Difficulty walking;Acute pain;Generalized weakness   PT Problem List Decreased strength;Decreased activity tolerance;Decreased knowledge of use of DME;Decreased mobility;Pain  PT Treatment Interventions DME instruction;Gait training;Functional mobility  training;Therapeutic activities;Patient/family education;Balance training;Therapeutic exercise   PT Goals (Current goals can be found in the Care Plan section) Acute Rehab PT Goals Patient Stated Goal: none stated Time For Goal Achievement: 02/06/16 Potential to Achieve Goals: Fair    Frequency Min 3X/week   Barriers to discharge Decreased caregiver support      Co-evaluation               End of Session Equipment Utilized During Treatment: Gait belt Activity Tolerance: Patient limited by fatigue Patient left: in bed;with call bell/phone within reach;with bed alarm set Nurse Communication: Mobility status         Time: [id number] PT Time Calculation (min) (ACUTE ONLY): 21 min   Charges:   PT Evaluation $PT Eval Low Complexity: 1 Procedure     PT G CodesPhilomena Doheny 01/23/2016, 3:17 PM [phone]

## 2016-01-23 NOTE — Procedures (Signed)
Successful US guided paracentesis from LLQ.  Yielded 5.8L of clear yellow fluid.  No immediate complications.  Pt tolerated well.   Specimen was not sent for labs.  Ascencion Dike PA-C 01/23/2016 11:28 AM

## 2016-01-23 NOTE — Progress Notes (Signed)
PT Cancellation Note  Patient Details Name: Christian Wallace MRN: [id number] DOB: [birth year]   Cancelled Treatment:    Reason Eval/Treat Not Completed: Patient at procedure or test/unavailable (pt at paracentesis, will follow. )   Philomena Doheny 01/23/2016, 11:06 AM [phone]

## 2016-01-24 DIAGNOSIS — R5381 Other malaise: Secondary | ICD-10-CM | POA: Diagnosis present

## 2016-01-24 DIAGNOSIS — S0990XD Unspecified injury of head, subsequent encounter: Secondary | ICD-10-CM

## 2016-01-24 LAB — BASIC METABOLIC PANEL
Anion gap: 7 (ref 5–15)
BUN: 33 mg/dL — AB (ref 6–20)
CALCIUM: 8.3 mg/dL — AB (ref 8.9–10.3)
CHLORIDE: 109 mmol/L (ref 101–111)
CO2: 22 mmol/L (ref 22–32)
CREATININE: 1.96 mg/dL — AB (ref 0.61–1.24)
GFR calc Af Amer: 36 mL/min — ABNORMAL LOW (ref >60)
GFR calc non Af Amer: 31 mL/min — ABNORMAL LOW (ref >60)
GLUCOSE: 119 mg/dL — AB (ref 65–99)
Potassium: 4.7 mmol/L (ref 3.5–5.1)
Sodium: 138 mmol/L (ref 135–145)

## 2016-01-24 LAB — CBC
HEMATOCRIT: 31.6 % — AB (ref 39.0–52.0)
HEMOGLOBIN: 10.6 g/dL — AB (ref 13.0–17.0)
MCH: 31.5 pg (ref 26.0–34.0)
MCHC: 33.5 g/dL (ref 30.0–36.0)
MCV: 93.8 fL (ref 78.0–100.0)
Platelets: 106 10*3/uL — ABNORMAL LOW (ref 150–400)
RBC: 3.37 MIL/uL — ABNORMAL LOW (ref 4.22–5.81)
RDW: 15.6 % — AB (ref 11.5–15.5)
WBC: 4.6 10*3/uL (ref 4.0–10.5)

## 2016-01-24 LAB — GLUCOSE, CAPILLARY
Glucose-Capillary: 126 mg/dL — ABNORMAL HIGH (ref 65–99)
Glucose-Capillary: 173 mg/dL — ABNORMAL HIGH (ref 65–99)

## 2016-01-24 MED ORDER — INSULIN ASPART 100 UNIT/ML ~~LOC~~ SOLN
0.0000 [IU] | Freq: Three times a day (TID) | SUBCUTANEOUS | Status: DC
Start: 2016-01-24 — End: 2016-01-24
  Administered 2016-01-24: 3 [IU] via SUBCUTANEOUS

## 2016-01-24 MED ORDER — SIMVASTATIN 40 MG PO TABS: 40.0000 mg | ORAL_TABLET | Freq: Every day | ORAL | Status: AC

## 2016-01-24 MED ORDER — GLIPIZIDE 10 MG PO TABS: 10.0000 mg | ORAL_TABLET | Freq: Every day | ORAL | Status: AC

## 2016-01-24 MED ORDER — INSULIN ASPART 100 UNIT/ML ~~LOC~~ SOLN: 0.0000 [IU] | Freq: Every day | SUBCUTANEOUS | Status: DC

## 2016-01-24 MED ORDER — ASPIRIN 81 MG PO CHEW: 81.0000 mg | CHEWABLE_TABLET | Freq: Every day | ORAL | Status: AC

## 2016-01-24 NOTE — Evaluation (Signed)
Clinical/Bedside Swallow Evaluation Patient Details  Name: Christian Wallace MRN: [id number] Date of Birth: February 07, 1941  Today's Date: 01/24/2016 Time: SLP Start Time (ACUTE ONLY): 0945 SLP Stop Time (ACUTE ONLY): 1021 SLP Time Calculation (min) (ACUTE ONLY): 36 min  Past Medical History:  Past Medical History  Diagnosis Date  . Diabetes mellitus 2014  . HTN (hypertension)   . HLD (hyperlipidemia)   . Anginal pain ([place])   . Dysphagia   . Neuromuscular disorder (HCC)     muscle pain in arms  . Cirrhosis ([place]) 05/2012    Hep C Ab +, Hep B surface Ab and core Ab +, Hep B Surface Ag -  . Biliary acute pancreatitis 05/2012  . Coronary artery calcification 01/23/2016  . Calcification of aorta (HCC) 01/23/2016  . Pancreatitis 05/22/2012  . Stage III chronic kidney disease 01/23/2016  . Thrombocytopenia ([place]) 01/23/2016  . Normocytic anemia 01/23/2016  . Encephalomalacia with cerebral infarction ([place]) 01/23/2016  . Old lacunar stroke without late effect 01/23/2016   Past Surgical History:  Past Surgical History  Procedure Laterality Date  . Ercp  05/22/2012    Procedure: ENDOSCOPIC RETROGRADE CHOLANGIOPANCREATOGRAPHY (ERCP);  Surgeon: Beryle Beams, MD;  Location: [facility];  Service: Endoscopy;  Laterality: N/A;  . Esophagogastroduodenoscopy N/A 10/27/2015    Procedure: ESOPHAGOGASTRODUODENOSCOPY (EGD);  Surgeon: Gatha Mayer, MD;  Location: [facility] ENDOSCOPY;  Service: Endoscopy;  Laterality: N/A;   HPI:  75 yo male who speaks [place] admited to [facility] with hyperkalemia - Has h/o ETOH use, Hep B and Hep C, liver cirrhosis, abdomen distention s/p paracentesis with 4 liters removed, 2 falls x one month *whic pt does not recall.  Order received for swallow evaluation, although he denies dysphagia today.  Pt CT head showed right basal ganglia chronic CVA and right occipital small CVA - pt denies h/o CVA.  He was oriented to self and location but not year from chioce of 2.     Assessment / Plan /  Recommendation Clinical Impression  SLP used interpreter videophone for session and left in room for use with other clinicians. Pt today presents with functional oropharyngeal swallow based on clinical swallow evaluation.  He denies dysphagia - but did not recall how he took his medications yesterday.  RN today reports pt tolerated po medicine with water well.   Pt agreeable to self feed applesauce, fruit, graham cracker and water.  Mastication was timely and adequate with pt's dentures with no oral pocketing.  NO indications of airway compromise or dysphagia apparent.  Recommend continue regular/thin diet as tolerated.  Educated pt to alternative ways to take medications if becomes problematic.  SLP to sign off, please reorder if indicated.      Aspiration Risk  Mild aspiration risk    Diet Recommendation Regular;Thin liquid   Medication Administration: Whole meds with liquid Supervision: Patient able to self feed Compensations: Slow rate;Small sips/bites    Other  Recommendations Oral Care Recommendations: Oral care BID   Follow up Recommendations    none   Frequency and Duration   none         Prognosis  n/a     Swallow Study   General Date of Onset: 01/24/16 HPI: 76 yo male who speaks [place] admited to [facility] with hyperkalemia - Has h/o ETOH use, Hep B and Hep C, liver cirrhosis, abdomen distention s/p paracentesis with 4 liters removed, 2 falls x one month *whic pt does not recall.  Order received for swallow evaluation, although he  denies dysphagia today.  Pt CT head showed right basal ganglia chronic CVA and right occipital small CVA - pt denies h/o CVA.  He was oriented to self and location but not year from chioce of 2.   Type of Study: Bedside Swallow Evaluation Diet Prior to this Study: Regular;Thin liquids Temperature Spikes Noted: No Respiratory Status: Room air History of Recent Intubation: No Behavior/Cognition: Alert;Cooperative;Pleasant mood Oral Cavity  Assessment: Within Functional Limits Oral Care Completed by SLP: No Oral Cavity - Dentition: Dentures, top;Dentures, bottom Vision: Functional for self-feeding Self-Feeding Abilities: Able to feed self Patient Positioning: Upright in bed Baseline Vocal Quality: Low vocal intensity;Suspected CN X (Vagus) involvement (pt reports voice became a whisper approx two weeks ago, does not identify other medical issues at that time) Volitional Cough: Weak Volitional Swallow: Able to elicit    Oral/Motor/Sensory Function Overall Oral Motor/Sensory Function:  (slight lingual deviation to left upon protrusion)   Ice Chips Ice chips: Not tested   Thin Liquid Thin Liquid: Within functional limits Presentation: Straw    Nectar Thick Nectar Thick Liquid: Not tested   Honey Thick Honey Thick Liquid: Not tested   Puree Puree: Within functional limits Presentation: Self Fed;Spoon   Solid   GO   Solid: Within functional limits Presentation: Self Fed;Spoon        Claudie Fisherman, [facility] SLP [phone]

## 2016-01-24 NOTE — Progress Notes (Signed)
CSW spoke with patient's daughter, Christian Wallace (ph#: [phone]) re: discharge planning. CSW reviewed PT evaluation & recommendation SNF vs. Home with 24 hour care. Daughter states that she thinks they'll take him home, rather than SNF - but will talk with the family & get back to CSW with decision. Daughter agreeable with information being sent out to [facility] for bed offers incase they agree with SNF. RNCM, Kathy aware.   CSW will await call back from daughter with discharge plan.    Raynaldo Opitz, [facility] Clinical Social Worker cell #: [phone]

## 2016-01-24 NOTE — NC FL2 (Signed)
[place] LEVEL OF CARE SCREENING TOOL     IDENTIFICATION  Patient Name: Christian Wallace Birthdate: 02-12-1941 Sex: male Admission Date (Current Location): 01/21/2016  [facility] and [place] Number:  Herbalist and Address:  [facility],  [place] [address], [place]      Provider Number:    Attending Physician Name and Address:  Venetia Maxon Rama, MD  Relative Name and Phone Number:       Current Level of Care: Hospital Recommended Level of Care: [place] Prior Approval Number:    Date Approved/Denied:   PASRR Number: [id number]  Discharge Plan: SNF    Current Diagnoses: Patient Active Problem List   Diagnosis Date Noted  . Coronary artery calcification 01/23/2016  . Calcification of aorta (HCC) 01/23/2016  . AKI (acute kidney injury) ([place]) 01/23/2016  . Stage III chronic kidney disease 01/23/2016  . Thrombocytopenia ([place]) 01/23/2016  . Normocytic anemia 01/23/2016  . Left rib fracture 01/23/2016  . Head trauma 01/23/2016  . Small vessel disease, cerebrovascular 01/23/2016  . 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 Encephalomalacia with cerebral infarction ([place]) 01/23/2016  . Hyperkalemia 01/22/2016  . Acute encephalopathy 01/22/2016  . Dyspnea 01/22/2016  . Ascites   . Esophageal varices in alcoholic cirrhosis ([place])   . Cirrhosis of liver with ascites ([place]) 10/26/2015  . Malnutrition of moderate degree 10/26/2015  . Hepatoma ([place]) 06/01/2012  . Anemia 06/01/2012  . Diastolic dysfunction 0000000  . Diabetes mellitus ([place]) 05/26/2012  . Bruit, RCA 05/26/2012  . HTN (hypertension) 05/22/2012    Orientation RESPIRATION BLADDER Height & Weight     Self, Time, Situation, Place  Normal Continent Weight: 111 lb 5.3 oz (50.5 kg) Height:  5\' 5"  (165.1 cm) (Patient stated)  BEHAVIORAL SYMPTOMS/MOOD NEUROLOGICAL BOWEL NUTRITION STATUS   (N/A)  (NONE) Continent Diet (Diet Carb Modified; Fluid  consistency: Thin; Room service appropriate)  AMBULATORY STATUS COMMUNICATION OF NEEDS Skin   Limited Assist Verbally Normal                       Personal Care Assistance Level of Assistance  Bathing, Feeding, Dressing Bathing Assistance: Limited assistance Feeding assistance: Independent Dressing Assistance: Limited assistance     Functional Limitations Info  Sight, Hearing, Speech Sight Info: Adequate Hearing Info: Adequate Speech Info:  (No English; Needs Interpreter)    SPECIAL CARE FACTORS FREQUENCY  PT (By licensed PT), OT (By licensed OT)     PT Frequency: 5x week OT Frequency: 5x week            Contractures Contractures Info: Not present    Additional Factors Info  Code Status, Allergies Code Status Info: FULL Allergies Info: No Known Allergies           Current Medications (01/24/2016):  This is the current hospital active medication list Current Facility-Administered Medications  Medication Dose Route Frequency Provider Last Rate Last Dose  . 0.9 %  sodium chloride infusion  250 mL Intravenous PRN Bonnielee Haff, MD      . acetaminophen (TYLENOL) tablet 650 mg  650 mg Oral Q6H PRN Bonnielee Haff, MD   650 mg at 01/23/16 1513   Or  . acetaminophen (TYLENOL) suppository 650 mg  650 mg Rectal Q6H PRN Bonnielee Haff, MD      . aspirin chewable tablet 81 mg  81 mg Oral Daily Venetia Maxon Rama, MD   81 mg at 01/24/16 0757  .  insulin aspart (novoLOG) injection 0-15 Units  0-15 Units Subcutaneous TID WC Christina P Rama, MD      . insulin aspart (novoLOG) injection 0-5 Units  0-5 Units Subcutaneous QHS Christina P Rama, MD      . lactulose (CHRONULAC) 10 GM/15ML solution 30 g  30 g Oral BID Bonnielee Haff, MD   30 g at 01/24/16 0756  . morphine 2 MG/ML injection 2 mg  2 mg Intravenous Q4H PRN Ivor Costa, MD      . nadolol (CORGARD) tablet 40 mg  40 mg Oral Daily Bonnielee Haff, MD   40 mg at 01/24/16 0756  . ondansetron (ZOFRAN) injection 4 mg  4 mg  Intravenous Q8H PRN Ivor Costa, MD      . simvastatin (ZOCOR) tablet 40 mg  40 mg Oral Daily Bonnielee Haff, MD   40 mg at 01/23/16 1800  . sodium chloride flush (NS) 0.9 % injection 3 mL  3 mL Intravenous Q12H Bonnielee Haff, MD   3 mL at 01/24/16 1000  . sodium chloride flush (NS) 0.9 % injection 3 mL  3 mL Intravenous PRN Bonnielee Haff, MD         Discharge Medications: Please see discharge summary for a list of discharge medications.  Relevant Imaging Results:  Relevant Lab Results:   Additional Information SSN: [id number]  Harlon Flor, Student-SW [phone]

## 2016-01-24 NOTE — Discharge Instructions (Signed)
C? tr??ng (Ascites) C? tr??ng l tnh tr?ng tch t? d?ch th?a trong b?ng. C? tr??ng c th? c m?c ?? t? nh? ??n n?ng. B?nh c th? n?ng h?n n?u khng ???c ?i?u tr?[facility] Sledge [facility] Nh?ng nguyn nhn c th? c bao g?m:  X? gan. ?y l nguyn nhn ph? bi?n nh?t gy c? tr??ng.  Nhi?m trng ho?c vim trong ? b?ng.  Ung th? d? dy.  Suy tim.  B?nh th?n.  Vim tuy?n t?y.  C?c mu ?ng trong t?nh m?ch c?a gan. D?U HI?U V TRI?U CH?NG Cc d?u hi?u v tri?u ch?ng c th? bao g?m:  C?m gic ??y h?i trong b?ng. Tnh tr?ng ny l ph? bi?n.  T?ng kch th??c b?ng ho?c eo c?a qu v?.  S?ng n? ? chn qu v?.  S?ng bu ? nam gi?i.  Kh th?.  ?au b?ng.  T?ng cn b?t ng?. N?u tnh tr?ng l nh?, qu v? c th? khng c tri?u ch?ng. CH?N ?ON ?? ??a ra ch?n ?on, chuyn gia ch?m [place] s?c kh?e s?:  H?i v? b?nh s? c?a qu v?.  Khm th?c th?Lajuan Lines lm cc ki?m tra hnh ?nh, ch?ng h?n nh? siu p ho?c ch?p CT b?ng qu v?. ?I?U TR? ?i?u tr? ty thu?c [place] nguyn nhn gy c? tr??ng. Vi?c ?i?u tr? c th? bao g?m:  U?ng m?t vin thu?c lm qu v? ?i ti?u. Thu?c ny g?i l thu?c l?i ti?u (vin l?i ti?u).  Gi?m nghim ng?t l??ng mu?i (natri) ?n [place]. Mu?i c th? gy th?a d?ch b? gi? l?i trong c? th? lm c? tr??ng n?ng thm.  Lm th? thu?t ht ch?t d?ch ra kh?i ? b?ng (ch?c d?ch).  Lm th? thu?t chuy?n ch?t d?ch t? ? b?ng Karam m?t t?nh m?ch.  Lm m?t th? thu?t n?i hai t?nh m?ch chnh trong gan qu v? v lm gi?m p l?c ln gan c?a qu v? (Th? thu?t TIPS). C? tr??ng c th? kh?i ho?c ???c c?i thi?n nh? ?i?u tr? tnh tr?ng b?nh l gy ra n.  H??NG D?N CH?M [place] T?I NH  Theo di cn n?ng c?a qu v?. ?? lm ?i?u ny, qu v? t? cn [place] cng m?t th?i ?i?m m?i ngy v ghi l?i cn n?ng.  Theo di l??ng n??c u?ng v b?t k? thay ??i no v? l??ng n??c ti?u c?a qu v?.  Lm theo b?t k? h??ng d?n no c?a chuyn gia ch?m [place] s?c kh?e v? l??ng n??c u?ng.  C? g?ng khng ?n th?c ?n m?n (nhi?u mu?i).  Ch? s?  d?ng thu?c theo ch? d?n c?a chuyn gia ch?m [place] s?c kh?e.  Tun th? m?i cu?c h?n khm l?i theo ch? d?n c?a chuyn gia ch?m [place] s?c kh?e. ?i?u ny c vai tr quan tr?ng.  Bo co b?t k? thay ??i no v? s?c kh?e c?a qu v? cho chuyn gia ch?m [place] s?c kh?e bi?t, ??c bi?t l n?u qu v? c tri?u ch?ng m?i ho?c tri?u ch?ng c?a qu v? tr?m tr?ng thm. ?I KHM N?U:  Qu v? t?ng h?n 3 pound trong 3 ngy.  Kch th??c b?ng ho?c eo qu v? t?ng ln.  Qu v? c ch? s?ng n? m?i ? chn.  Tnh tr?ng s?ng chn tr? nn tr?m tr?ng h?n. NGAY L?P T?C ?I KHM N?U:  Qu v? b? s?t.  Qu v? b? l l?n.  Qu v? m?i b? kh th? m?i ho?c tnh tr?ng kh th? tr?m tr?ng h?n.  Qu v? m?i c c?n ?au b?ng ho?c c?n ?au  b?ng tr?m tr?ng h?n.  Qu v? m?i b? s?ng bu m?i ho?c ch? s?ng ? bu (nam gi?i) tr?m tr?ng h?n.   Thng tin ny khng nh?m m?c ?ch thay th? cho l?i khuyn m chuyn gia ch?m [place] s?c kh?e ni v?i qu v?. Hy b?o ??m qu v? ph?i th?o lu?n b?t k? v?n ?? g m qu v? c v?i chuyn gia ch?m [place] s?c kh?e c?a qu v?.   Document Released: 05/02/2010 Document Revised: 12/03/2014 Elsevier Interactive Patient Education [employer].

## 2016-01-24 NOTE — Clinical Social Work Placement (Signed)
   CLINICAL SOCIAL WORK PLACEMENT  NOTE  Date:  01/24/2016  Patient Details  Name: Christian Wallace MRN: [id number] Date of Birth: 03/09/1941  Clinical Social Work is seeking post-discharge placement for this patient at the [place] level of care (*CSW will initial, date and re-position this form in  chart as items are completed):  Yes   Patient/family provided with [place] Work Department's list of facilities offering this level of care within the geographic area requested by the patient (or if unable, by the patient's family).  Yes   Patient/family informed of their freedom to choose among providers that offer the needed level of care, that participate in Medicare, Medicaid or managed care program needed by the patient, have an available bed and are willing to accept the patient.      Patient/family informed of [facility]'s ownership interest in [facility] and [facility], as well as of the fact that they are under no obligation to receive care at these facilities.  PASRR submitted to EDS on       PASRR number received on 01/24/16     Existing PASRR number confirmed on       FL2 transmitted to all facilities in geographic area requested by pt/family on 01/24/16     FL2 transmitted to all facilities within larger geographic area on 01/24/16     Patient informed that his/her managed care company has contracts with or will negotiate with certain facilities, including the following:            Patient/family informed of bed offers received.  Patient chooses bed at       Physician recommends and patient chooses bed at      Patient to be transferred to   on  .  Patient to be transferred to facility by       Patient family notified on   of transfer.  Name of family member notified:        PHYSICIAN       Additional Comment:    _______________________________________________ Harlon Flor, Student-SW 01/24/2016, 10:41 AM

## 2016-01-24 NOTE — Discharge Summary (Signed)
Physician Discharge Summary  Jeffrey Rede [id number] DOB: [birth year] DOA: 01/21/2016  PCP: Imelda Pillow, NP  Admit date: 01/21/2016 Discharge date: 01/24/2016   Recommendations for Outpatient Follow-Up:   1. Home health services including PT/OT, RN, aide and social worker set up. 2. Recommend follow-up creatinine in one week. 3. PCP: Note: Metformin discontinued secondary to deterioration of renal function. Glucotrol started. These close outpatient follow-up of glycemic control. 4. Follow-up final peritoneal cultures.   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    Discharge Condition: Guarded.  Diet recommendation: Low sodium, heart healthy.    History of Present Illness:    Christian Wallace is an 75 y.o. 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 He also had a 2-3 day history of worsening confusion. In the emergency department, patient was found to have abdominal distention. He was noted to be hyperkalemic. Troponins were minimally  elevated. A diagnostic paracentesis was done.   Hospital Course by Problem:   Principal Problem:  Hyperkalemia, likely secondary to treatment with spironolactone - Patient was given Kayexalate and calcium gluconate. Spironolactone discontinued. - Potassium 4.7 this morning.  Active Problems:  Encephalomalacia with cerebral infarction, microvascular cerebrovascular disease, old lacunar stroke without late effect - Noted incidentally on CT of the head. - Speech therapy evaluation requested for evaluation of dysphasia. PT/OT evaluations performed: SNF recommended. - Aspirin started 01/23/16. Already on a statin.   Acute left ninth rib fracture/head trauma - Recent falls at home. Family wishes to take him home with home physical therapy.   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 No signs of bleeding.   Normocytic anemia - Likely anemia of chronic disease. Monitor hemoglobin.   Acute kidney injury/stage III chronic kidney disease - Baseline creatinine appears to be around 1.5. Current creatinine elevated over usual baseline values. - Spironolactone discontinued, creatinine slightly worse today, likely reflective of volume depletion after large-volume paracentesis.   HTN (hypertension) - Diuretics discontinued secondary to deterioration of kidney function. Continue nadolol.   Diabetes mellitus ([place]) - Metformin stopped secondary to deterioration of renal function. We'll discharge home on Glucotrol instead.   Cirrhosis of liver with ascites (HCC)/esophageal varices and alcoholic cirrhosis - Diagnostic paracentesis not consistent with spontaneous bacterial peritonitis. Follow-up peritoneal fluid cultures. - Ammonia level was normal on admission. Continue lactulose. - Therapeutic paracentesis requested for relief of symptoms. - Continue nadolol.   Acute encephalopathy with frequent falls - Ammonia level WNL, continue lactulose. Follow-up peritoneal fluid  cultures. - CT  of the head negative for acute findings. - Home health services set up at discharge.   Dyspnea/elevated troponin/coronary artery calcifications/aortic artery calcifications - No complaints of chest pain. EKG without acute changes. Troponins mildly elevated. - 2-D echo showed EF 55-60 percent with grade 1 diastolic dysfunction and no regional wall motion abnormalities. - Continue aspirin and statin.   Incidental cholelithiasis - Outpatient follow-up unless he becomes symptomatic.   Hyperlipidemia - Continue Zocor.    Medical Consultants:    None.   Discharge Exam:   Filed Vitals:   01/24/16 0756 01/24/16 1258  BP:  114/69  Pulse: 62 55  Temp:  98 F (36.7 C)  Resp:  16   Filed Vitals:   01/23/16 2107 01/24/16 0630 01/24/16 0756 01/24/16 1258  BP: 104/54 130/71  114/69  Pulse: 54 56 62 55  Temp: 97.9 F (36.6 C) 97.9 F (36.6 C)  98 F (36.7 C)  TempSrc: Oral Oral  Oral  Resp: 16 16  16   Height:      Weight:  50.5 kg (111 lb 5.3 oz)    SpO2: 100%   100%    Gen:  NAD, weak, frail Cardiovascular:  RRR, No M/R/G Respiratory: Lungs CTAB Gastrointestinal: Abdomen soft, NT/ND with normal active bowel sounds. Extremities: No C/E/C   The results of significant diagnostics from this hospitalization (including imaging, microbiology, ancillary and laboratory) are listed below for reference.     Procedures and Diagnostic Studies:   Ct Abdomen Pelvis Wo Contrast  01/22/2016  CLINICAL DATA:  Acute onset of generalized abdominal distention. Ascites. Initial encounter. EXAM: CT ABDOMEN AND PELVIS WITHOUT CONTRAST TECHNIQUE: Multidetector CT imaging of the abdomen and pelvis was performed following the standard protocol without IV contrast. COMPARISON:  CT of the abdomen and pelvis performed 10/26/2015 FINDINGS: Trace bilateral pleural fluid is noted, with bibasilar atelectasis or scarring. Diffuse coronary artery calcifications are seen. A large volume of  ascites is noted within the abdomen and pelvis. The diffusely nodular contour of the liver is compatible with hepatic cirrhosis. The spleen is unremarkable in appearance. The gallbladder is not well assessed, though it appears to contain several large stones. The pancreas and adrenal glands are unremarkable. The kidneys are unremarkable in appearance. There is no evidence of hydronephrosis. No renal or ureteral stones are seen. No perinephric stranding is appreciated. No free fluid is identified. The small bowel is unremarkable in appearance. The stomach is within normal limits. No acute vascular abnormalities are seen. Scattered calcification is noted along the abdominal aorta and its branches. The appendix is normal in caliber and contains air, without evidence of appendicitis. The colon is unremarkable in appearance. The bladder is mildly distended and grossly unremarkable. The prostate remains normal in size. No inguinal lymphadenopathy is seen. No acute osseous abnormalities are identified. IMPRESSION: 1. Large volume ascites within the abdomen and pelvis. 2. Changes of hepatic cirrhosis noted. 3. Cholelithiasis. Gallbladder otherwise unremarkable, though not well assessed. 4. Scattered calcification along the abdominal aorta and its branches. 5. Trace bilateral pleural fluid, with bibasilar atelectasis or scarring. 6. Diffuse coronary artery calcifications seen. Electronically Signed   By: Garald Balding M.D.   On: 01/22/2016 03:39   Dg Chest 2 View  01/22/2016  CLINICAL DATA:  Shortness of breath for 2 weeks, associated abdominal distention. History of diabetes, hypertension and cirrhosis. EXAM: CHEST  2 VIEW COMPARISON:  Chest radiograph November 27, 2013 FINDINGS: Cardiac silhouette is upper limits of normal flex, calcified aortic knob. Low inspiratory examination  with bibasilar strandy densities. No pleural effusion focal consolidation. No pneumothorax. Distended abdomen with hazy density. Acute appearing  nondisplaced LEFT posterior ninth rib fracture. IMPRESSION: Borderline cardiomegaly and bibasilar atelectasis in this low inspiratory examination. Hazy distended abdomen suggests ascites, partially imaged. Acute LEFT posterior ninth rib fracture. Electronically Signed   By: Elon Alas M.D.   On: 01/22/2016 00:41   Ct Head Wo Contrast  01/22/2016  CLINICAL DATA:  Acute onset of worsening confusion. Initial encounter. EXAM: CT HEAD WITHOUT CONTRAST TECHNIQUE: Contiguous axial images were obtained from the base of the skull through the vertex without intravenous contrast. COMPARISON:  CT of the head performed 11/09/2010 FINDINGS: There is no evidence of acute infarction, mass lesion, or intra- or extra-axial hemorrhage on CT. Prominence of the ventricles and sulci reflects moderate cortical volume loss, more prominent than on the prior study. Cerebellar atrophy is noted. Relatively diffuse periventricular and subcortical white matter change likely reflects small vessel ischemic microangiopathy. Small chronic lacunar infarcts are noted at the right basal ganglia. A small infarct is noted at the right occipital lobe, with associated encephalomalacia. The brainstem and fourth ventricle are within normal limits. No mass effect or midline shift is seen. There is no evidence of fracture; visualized osseous structures are unremarkable in appearance. The visualized portions of the orbits are within normal limits. The paranasal sinuses and mastoid air cells are well-aerated. Soft tissue swelling is noted lateral to the left orbit and zygomatic arch. IMPRESSION: 1. No acute intracranial pathology seen on CT. 2. Soft tissue swelling noted lateral to the left orbit and zygomatic arch. 3. Moderate cortical volume loss and diffuse small vessel ischemic microangiopathy. 4. 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 Small infarct at the right occipital lobe, with associated encephalomalacia. Electronically  Signed   By: Garald Balding M.D.   On: 01/22/2016 00:32     Labs:   Basic Metabolic Panel:  Recent Labs Lab 01/22/16 0006 01/22/16 0837 01/23/16 0509 01/24/16 0432  NA 137 136 138 138  K 6.4* 5.2* 5.0 4.7  CL 109 110 109 109  CO2 23 21* 21* 22  GLUCOSE 169* 105* 197* 119*  BUN 24* 24* 28* 33*  CREATININE 1.56* 1.54* 1.79* 1.96*  CALCIUM 8.6* 8.5* 8.5* 8.3*   GFR Estimated Creatinine Clearance: 22.9 mL/min (by C-G formula based on Cr of 1.96). Liver Function Tests:  Recent Labs Lab 01/22/16 0006  AST 54*  ALT 36  ALKPHOS 271*  BILITOT 0.9  PROT 7.3  ALBUMIN 2.4*    Recent Labs Lab 01/22/16 0006  LIPASE 45    Recent Labs Lab 01/22/16 0005 01/23/16 0509  AMMONIA 15 45*   Coagulation profile  Recent Labs Lab 01/22/16 0006  INR 1.19    CBC:  Recent Labs Lab 01/22/16 0006 01/23/16 0509 01/24/16 0432  WBC 6.3 5.3 4.6  NEUTROABS 4.0  --   --   HGB 11.7* 10.6* 10.6*  HCT 35.4* 31.7* 31.6*  MCV 95.9 94.6 93.8  PLT 141* 118* 106*   Cardiac Enzymes:  Recent Labs Lab 01/22/16 0015 01/22/16 0410 01/22/16 1021 01/22/16 1625  TROPONINI 0.05* 0.04* 0.05* 0.04*   BNP: Invalid input(s): POCBNP CBG:  Recent Labs Lab 01/23/16 1152 01/23/16 1646 01/23/16 2114 01/24/16 0730 01/24/16 1131  GLUCAP 189* 237* 102* 126* 173*   Microbiology Recent Results (from the past 240 hour(s))  Body fluid culture     Status: None (Preliminary result)   Collection Time: 01/22/16  3:10 AM  Result Value Ref Range Status   Specimen Description PERITONEAL CAVITY  Final   Special Requests NONE  Final   Gram Stain   Final    FEW WBC PRESENT, PREDOMINANTLY MONONUCLEAR NO ORGANISMS SEEN    Culture   Final    NO GROWTH 2 DAYS Performed at [facility]    Report Status PENDING  Incomplete     Discharge Instructions:       Discharge Instructions    Call MD for:  persistant nausea and vomiting    Complete by:  As directed      Call MD for:   severe uncontrolled pain    Complete by:  As directed      Diet - low sodium heart healthy    Complete by:  As directed      Face-to-face encounter (required for Medicare/Medicaid patients)    Complete by:  As directed   I RAMA,CHRISTINA certify that this patient is under my care and that I, or a nurse practitioner or physician's assistant working with me, had a face-to-face encounter that meets the physician face-to-face encounter requirements with this patient on 01/24/2016. The encounter with the patient was in whole, or in part for the following medical condition(s) which is the primary reason for home health care (List medical condition): Cirrhosis, physically deconditioned.  Needs RN to assess safety, teach disease management.  The encounter with the patient was in whole, or in part, for the following medical condition, which is the primary reason for home health care:  Cirrhosis, physical deconditioning  I certify that, based on my findings, the following services are medically necessary home health services:   Nursing Physical therapy    Reason for Medically Necessary Home Health Services:   Skilled Nursing- Skilled Assessment/Observation Skilled Nursing- Change/Decline in Patient Status    My clinical findings support the need for the above services:  Unable to leave home safely without assistance and/or assistive device  Further, I certify that my clinical findings support that this patient is homebound due to:  Unable to leave home safely without assistance     Home Health    Complete by:  As directed   To provide the following care/treatments:   PT [place] work       Increase activity slowly    Complete by:  As directed      Walk with assistance    Complete by:  As directed      Walker     Complete by:  As directed             Medication List    STOP taking these medications        furosemide 20 MG tablet  Commonly known as:  LASIX      metFORMIN 1000 MG tablet  Commonly known as:  GLUCOPHAGE     spironolactone 50 MG tablet  Commonly known as:  ALDACTONE      TAKE these medications        aspirin 81 MG chewable tablet  Chew 1 tablet (81 mg total) by mouth daily.     glipiZIDE 10 MG tablet  Commonly known as:  GLUCOTROL  Take 1 tablet (10 mg total) by mouth daily before breakfast.     lactulose 10 GM/15ML solution  Commonly known as:  CHRONULAC  Take 30 ml BID     nadolol 40 MG tablet  Commonly known as:  CORGARD  Take 1 tablet (40  mg total) by mouth daily.     simvastatin 40 MG tablet  Commonly known as:  ZOCOR  Take 40 mg by mouth daily.       Follow-up Information    Follow up with [facility].   Why:  HHRN/PT/OT/aide/CSW   Contact information:   [address] [place] [address] [phone]        Time coordinating discharge: 35 minutes.  Signed:  RAMA,CHRISTINA  Pager [phone] [facility] 01/24/2016, 3:01 PM

## 2016-01-24 NOTE — Care Management Note (Signed)
Case Management Note  Patient Details  Name: Axil Crisostomo MRN: [id number] Date of Birth: November 20, 1941  Subjective/Objective:   Spoke to dtr-Kaylen([place]) tel#[phone] home w/HHC,they decline SNF. [facility] chosen-TC rep Santiago Glad aware of HHRN/PT/OT/Aide/SW order-(aware that patient speaks [place])  Family will transp home on their own. Per dtr Lorelee Market be a late d/c since she works.                Action/Plan:d/c home w/HHC.   Expected Discharge Date:                  Expected Discharge Plan:  [place]  In-House Referral:     Discharge planning Services  CM Consult  Post Acute Care Choice:    Choice offered to:  Adult Children  DME Arranged:    DME Agency:     HH Arranged:  RN, PT, OT, Nurse's Aide, Social Work [employer] Agency:  [place]  Status of Service:  Completed, signed off  Medicare Important Message Given:    Date Medicare IM Given:    Medicare IM give by:    Date Additional Medicare IM Given:    Additional Medicare Important Message give by:     If discussed at [place] of Stay Meetings, dates discussed:    Additional Comments:  Dessa Phi, RN 01/24/2016, 12:59 PM

## 2016-01-24 NOTE — Evaluation (Signed)
Occupational Therapy Evaluation Patient Details Name: Christian Wallace MRN: [id number] DOB: Feb 07, 1941 Today's Date: 01/24/2016    History of Present Illness 75 y.o. male with h/o EtOH, Hepatitis C and B, DM2 admitted with SOB, LE swelling, abdominal distension, 2 falls in past month.    Clinical Impression   Pt admitted with falls/ SOB. Pt currently with functional limitations due to the deficits listed below (see OT Problem List).  Pt will benefit from skilled OT to increase their safety and independence with ADL and functional mobility for ADL to facilitate discharge to venue listed below.      Follow Up Recommendations  SNF    Equipment Recommendations  None recommended by OT    Recommendations for Other Services       Precautions / Restrictions Precautions Precautions: Fall      Mobility Bed Mobility Overal bed mobility: Needs Assistance Bed Mobility: Supine to Sit     Supine to sit: Mod assist     General bed mobility comments: assist to raise trunk  Transfers Overall transfer level: Needs assistance Equipment used: Rolling walker (2 wheeled)   Sit to Stand: Mod assist         General transfer comment: mod A to rise         ADL Overall ADL's : Needs assistance/impaired     Grooming: Set up;Wash/dry face;Sitting                   Toilet Transfer: Moderate assistance;BSC;Stand-pivot   Toileting- Clothing Manipulation and Hygiene: Moderate assistance;Sit to/from stand;Cueing for sequencing                         Pertinent Vitals/Pain Pain Assessment: Faces Pain Score: 0-No pain Faces Pain Scale: Hurts a little bit Pain Location: abdomen Pain Descriptors / Indicators: Sore Pain Intervention(s): Repositioned;Monitored during session     Hand Dominance     Extremity/Trunk Assessment Upper Extremity Assessment Upper Extremity Assessment: Generalized weakness           Communication Communication Communication: Interpreter  utilized;Prefers language other than English;Other (comment) (used video interpretor)   Cognition Arousal/Alertness: Awake/alert Behavior During Therapy: WFL for tasks assessed/performed Overall Cognitive Status: No family/caregiver present to determine baseline cognitive functioning (pt denied h/o falls, chart reported 2 falls in past month; pt able to follow commands)                                Home Living Family/patient expects to be discharged to:: Private residence Living Arrangements: Alone Available Help at Discharge: Family;Available PRN/intermittently Type of Home: House Home Access: Stairs to enter [employer] of Steps: 2   Home Layout: One level               Home Equipment: None               OT Diagnosis: Generalized weakness   OT Problem List: Decreased strength;Decreased activity tolerance   OT Treatment/Interventions: Self-care/ADL training;DME and/or AE instruction    OT Goals(Current goals can be found in the care plan section) Acute Rehab OT Goals OT Goal Formulation: With patient Time For Goal Achievement: 01/31/16 Potential to Achieve Goals: Good  OT Frequency: Min 2X/week   Barriers to D/C: Decreased caregiver support             End of Session Nurse Communication: Mobility status  Activity Tolerance: Patient limited by fatigue  Patient left: in bed   Time: 1220-1237 OT Time Calculation (min): 17 min Charges:  OT General Charges $OT Visit: 1 Procedure OT Evaluation $OT Eval Low Complexity: 1 Procedure G-Codes:    Betsy Pries 02-22-2016, 12:41 PM

## 2016-01-25 LAB — BODY FLUID CULTURE: Culture: NO GROWTH

## 2016-01-27 ENCOUNTER — Telehealth: Payer: Self-pay | Admitting: Physician Assistant

## 2016-01-27 MED ORDER — LACTULOSE 10 GM/15ML PO SOLN: ORAL | Status: AC

## 2016-01-27 NOTE — Telephone Encounter (Signed)
Increase lactulose back to TID to QID target is 2-4 soft, formed stools daily If no BM, or more confusion call us back ASAP

## 2016-01-27 NOTE — Telephone Encounter (Signed)
Patient is not having a good bowel movement since Lactulose was decreased to BID. His daughter is reporting he is more confused also. Please, advise.

## 2016-01-27 NOTE — Telephone Encounter (Signed)
Rx sent to pharmacy with change. Spoke with Estill Bamberg and gave her recommendations. She will call patient's daughter.

## 2016-01-28 ENCOUNTER — Encounter (HOSPITAL_COMMUNITY): Payer: Self-pay

## 2016-01-28 ENCOUNTER — Emergency Department (HOSPITAL_COMMUNITY): Payer: Medicare Other

## 2016-01-28 ENCOUNTER — Inpatient Hospital Stay (HOSPITAL_COMMUNITY)
Admission: EM | Admit: 2016-01-28 | Discharge: 2016-02-25 | DRG: 432 | Disposition: E | Payer: Medicare Other | Attending: Internal Medicine | Admitting: Internal Medicine

## 2016-01-28 DIAGNOSIS — Z87891 Personal history of nicotine dependence: Secondary | ICD-10-CM | POA: Diagnosis not present

## 2016-01-28 DIAGNOSIS — I469 Cardiac arrest, cause unspecified: Secondary | ICD-10-CM | POA: Diagnosis not present

## 2016-01-28 DIAGNOSIS — N179 Acute kidney failure, unspecified: Secondary | ICD-10-CM | POA: Diagnosis present

## 2016-01-28 DIAGNOSIS — B1920 Unspecified viral hepatitis C without hepatic coma: Secondary | ICD-10-CM | POA: Diagnosis present

## 2016-01-28 DIAGNOSIS — N183 Chronic kidney disease, stage 3 (moderate): Secondary | ICD-10-CM | POA: Diagnosis present

## 2016-01-28 DIAGNOSIS — K7030 Alcoholic cirrhosis of liver without ascites: Principal | ICD-10-CM | POA: Diagnosis present

## 2016-01-28 DIAGNOSIS — D649 Anemia, unspecified: Secondary | ICD-10-CM | POA: Diagnosis present

## 2016-01-28 DIAGNOSIS — D689 Coagulation defect, unspecified: Secondary | ICD-10-CM | POA: Diagnosis present

## 2016-01-28 DIAGNOSIS — E861 Hypovolemia: Secondary | ICD-10-CM | POA: Diagnosis present

## 2016-01-28 DIAGNOSIS — Z79899 Other long term (current) drug therapy: Secondary | ICD-10-CM | POA: Diagnosis not present

## 2016-01-28 DIAGNOSIS — I8511 Secondary esophageal varices with bleeding: Secondary | ICD-10-CM | POA: Diagnosis present

## 2016-01-28 DIAGNOSIS — I129 Hypertensive chronic kidney disease with stage 1 through stage 4 chronic kidney disease, or unspecified chronic kidney disease: Secondary | ICD-10-CM | POA: Diagnosis present

## 2016-01-28 DIAGNOSIS — Z8673 Personal history of transient ischemic attack (TIA), and cerebral infarction without residual deficits: Secondary | ICD-10-CM

## 2016-01-28 DIAGNOSIS — R4182 Altered mental status, unspecified: Secondary | ICD-10-CM | POA: Diagnosis present

## 2016-01-28 DIAGNOSIS — K920 Hematemesis: Secondary | ICD-10-CM | POA: Diagnosis present

## 2016-01-28 DIAGNOSIS — E785 Hyperlipidemia, unspecified: Secondary | ICD-10-CM | POA: Diagnosis present

## 2016-01-28 DIAGNOSIS — K922 Gastrointestinal hemorrhage, unspecified: Secondary | ICD-10-CM

## 2016-01-28 DIAGNOSIS — G9389 Other specified disorders of brain: Secondary | ICD-10-CM | POA: Diagnosis present

## 2016-01-28 DIAGNOSIS — I2510 Atherosclerotic heart disease of native coronary artery without angina pectoris: Secondary | ICD-10-CM | POA: Diagnosis present

## 2016-01-28 DIAGNOSIS — D6959 Other secondary thrombocytopenia: Secondary | ICD-10-CM | POA: Diagnosis present

## 2016-01-28 DIAGNOSIS — R001 Bradycardia, unspecified: Secondary | ICD-10-CM | POA: Diagnosis present

## 2016-01-28 DIAGNOSIS — R54 Age-related physical debility: Secondary | ICD-10-CM | POA: Diagnosis present

## 2016-01-28 DIAGNOSIS — E1122 Type 2 diabetes mellitus with diabetic chronic kidney disease: Secondary | ICD-10-CM | POA: Diagnosis present

## 2016-01-28 DIAGNOSIS — R1310 Dysphagia, unspecified: Secondary | ICD-10-CM | POA: Diagnosis present

## 2016-01-28 DIAGNOSIS — I700 Atherosclerosis of aorta: Secondary | ICD-10-CM | POA: Diagnosis present

## 2016-01-28 DIAGNOSIS — Z7984 Long term (current) use of oral hypoglycemic drugs: Secondary | ICD-10-CM | POA: Diagnosis not present

## 2016-01-28 DIAGNOSIS — T17490A Other foreign object in trachea causing asphyxiation, initial encounter: Secondary | ICD-10-CM | POA: Diagnosis not present

## 2016-01-28 DIAGNOSIS — Z7982 Long term (current) use of aspirin: Secondary | ICD-10-CM | POA: Diagnosis not present

## 2016-01-28 LAB — COMPREHENSIVE METABOLIC PANEL
ALBUMIN: 1.6 g/dL — AB (ref 3.5–5.0)
ALT: 45 U/L (ref 17–63)
AST: 68 U/L — AB (ref 15–41)
Alkaline Phosphatase: 266 U/L — ABNORMAL HIGH (ref 38–126)
Anion gap: 10 (ref 5–15)
BUN: 57 mg/dL — AB (ref 6–20)
CO2: 19 mmol/L — AB (ref 22–32)
Calcium: 7.7 mg/dL — ABNORMAL LOW (ref 8.9–10.3)
Chloride: 110 mmol/L (ref 101–111)
Creatinine, Ser: 1.81 mg/dL — ABNORMAL HIGH (ref 0.61–1.24)
GFR calc Af Amer: 40 mL/min — ABNORMAL LOW (ref >60)
GFR calc non Af Amer: 35 mL/min — ABNORMAL LOW (ref >60)
GLUCOSE: 212 mg/dL — AB (ref 65–99)
POTASSIUM: 5.3 mmol/L — AB (ref 3.5–5.1)
Sodium: 139 mmol/L (ref 135–145)
Total Bilirubin: 0.7 mg/dL (ref 0.3–1.2)
Total Protein: 5.7 g/dL — ABNORMAL LOW (ref 6.5–8.1)

## 2016-01-28 LAB — CBC WITH DIFFERENTIAL/PLATELET
Basophils Absolute: 0 10*3/uL (ref 0.0–0.1)
Basophils Relative: 0 %
EOS PCT: 3 %
Eosinophils Absolute: 0.2 10*3/uL (ref 0.0–0.7)
HEMATOCRIT: 26.3 % — AB (ref 39.0–52.0)
Hemoglobin: 8.7 g/dL — ABNORMAL LOW (ref 13.0–17.0)
LYMPHS ABS: 1.9 10*3/uL (ref 0.7–4.0)
Lymphocytes Relative: 28 %
MCH: 31.3 pg (ref 26.0–34.0)
MCHC: 33.1 g/dL (ref 30.0–36.0)
MCV: 94.6 fL (ref 78.0–100.0)
MONOS PCT: 5 %
Monocytes Absolute: 0.3 10*3/uL (ref 0.1–1.0)
NEUTROS ABS: 4.5 10*3/uL (ref 1.7–7.7)
Neutrophils Relative %: 64 %
Platelets: 150 10*3/uL (ref 150–400)
RBC: 2.78 MIL/uL — AB (ref 4.22–5.81)
RDW: 16 % — AB (ref 11.5–15.5)
WBC: 6.9 10*3/uL (ref 4.0–10.5)

## 2016-01-28 LAB — LACTIC ACID, PLASMA: Lactic Acid, Venous: 4.7 mmol/L (ref 0.5–2.0)

## 2016-01-28 LAB — AMMONIA: Ammonia: 66 umol/L — ABNORMAL HIGH (ref 9–35)

## 2016-01-28 LAB — PROTIME-INR
INR: 1.24 (ref 0.00–1.49)
Prothrombin Time: 15.7 seconds — ABNORMAL HIGH (ref 11.6–15.2)

## 2016-01-28 MED ORDER — OCTREOTIDE LOAD VIA INFUSION
50.0000 ug | Freq: Once | INTRAVENOUS | Status: AC
  Administered 2016-01-28: 50 ug via INTRAVENOUS
  Filled 2016-01-28: qty 25

## 2016-01-28 MED ORDER — DEXTROSE 5 % IV SOLN
1.0000 g | Freq: Once | INTRAVENOUS | Status: AC
  Administered 2016-01-28: 1 g via INTRAVENOUS
  Filled 2016-01-28: qty 10

## 2016-01-28 MED ORDER — ONDANSETRON HCL 4 MG/2ML IJ SOLN
4.0000 mg | Freq: Once | INTRAMUSCULAR | Status: AC
  Administered 2016-01-28: 4 mg via INTRAVENOUS
  Filled 2016-01-28: qty 2

## 2016-01-28 MED ORDER — SODIUM CHLORIDE 0.9 % IV BOLUS (SEPSIS)
1000.0000 mL | Freq: Once | INTRAVENOUS | Status: AC
  Administered 2016-01-28: 1000 mL via INTRAVENOUS

## 2016-01-28 MED ORDER — OCTREOTIDE ACETATE 500 MCG/ML IJ SOLN
50.0000 ug/h | INTRAMUSCULAR | Status: DC
  Administered 2016-01-28: 50 ug/h via INTRAVENOUS
  Filled 2016-01-28 (×2): qty 1

## 2016-01-28 MED ORDER — PANTOPRAZOLE SODIUM 40 MG IV SOLR
40.0000 mg | Freq: Once | INTRAVENOUS | Status: AC
  Administered 2016-01-28: 40 mg via INTRAVENOUS
  Filled 2016-01-28: qty 40

## 2016-01-28 MED ORDER — SODIUM CHLORIDE 0.9 % IV BOLUS (SEPSIS)
3000.0000 mL | Freq: Once | INTRAVENOUS | Status: AC
  Administered 2016-01-28: 3000 mL via INTRAVENOUS

## 2016-01-28 MED ORDER — SODIUM CHLORIDE 0.9 % IV SOLN
250.0000 mL | INTRAVENOUS | Status: DC | PRN
Start: 2016-01-28 — End: 2016-01-29

## 2016-01-28 NOTE — H&P (Signed)
PULMONARY / CRITICAL CARE MEDICINE HISTORY AND PHYSICAL EXAMINATION   Name: Christian Wallace MRN: [id number] DOB: 17-Dec-1940    ADMISSION DATE:  01/30/2016  PRIMARY SERVICE: PCCM  CHIEF COMPLAINT:  Hematemesis  BRIEF PATIENT DESCRIPTION: 75 y/o man with history of cirrhosis presenting with GIB  SIGNIFICANT EVENTS / STUDIES:  - Hb of 8.7 (was 10.6 on 2/28).  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 On the day of admission he was found to have hematemesis and AMS and was taken the ED. He had intermittent bradycardia and was somnolent, [facility] was consulted for admission.  PAST MEDICAL HISTORY :  Past Medical History  Diagnosis Date  . Diabetes mellitus 2014  . HTN (hypertension)   . HLD (hyperlipidemia)   . Anginal pain ([place])   . Dysphagia   . Neuromuscular disorder (HCC)     muscle pain in arms  . Cirrhosis ([place]) 05/2012    Hep C Ab +, Hep B surface Ab and core Ab +, Hep B Surface Ag -  . Biliary acute pancreatitis 05/2012  . Coronary artery calcification 01/23/2016  . Calcification of aorta (HCC) 01/23/2016  . Pancreatitis 05/22/2012  . Stage III chronic kidney disease 01/23/2016  . Thrombocytopenia ([place]) 01/23/2016  . Normocytic anemia 01/23/2016  . 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 Old lacunar stroke without late effect 01/23/2016   Past Surgical History  Procedure Laterality Date  . Ercp  05/22/2012    Procedure: ENDOSCOPIC RETROGRADE CHOLANGIOPANCREATOGRAPHY (ERCP);  Surgeon: Beryle Beams, MD;  Location: [facility];  Service: Endoscopy;  Laterality: N/A;  . Esophagogastroduodenoscopy N/A 10/27/2015    Procedure: ESOPHAGOGASTRODUODENOSCOPY (EGD);  Surgeon: Gatha Mayer, MD;  Location: [facility] ENDOSCOPY;  Service: Endoscopy;  Laterality: N/A;   Prior to Admission medications   Medication Sig Start Date End Date Taking? Authorizing Provider  aspirin 81 MG chewable tablet Chew 1 tablet (81 mg total) by mouth daily. 01/24/16   Venetia Maxon Rama, MD  glipiZIDE (GLUCOTROL) 10 MG tablet Take 1 tablet (10 mg total) by mouth daily before breakfast. 01/24/16   Venetia Maxon Rama, MD  lactulose (CHRONULAC) 10 GM/15ML solution Take 30 ml TID to QID target is 2-4 soft, formed stools daily 01/27/16   Jerene Bears, MD  nadolol (CORGARD) 40 MG tablet Take 1 tablet (40 mg total) by mouth daily. 11/03/15   Lori P Hvozdovic, PA-C  simvastatin (ZOCOR) 40 MG tablet Take 1 tablet (40 mg total) by mouth daily. 01/24/16   Venetia Maxon Rama, MD   No Known Allergies  FAMILY HISTORY:  No family history on file. SOCIAL HISTORY:  reports that he has quit smoking. His smoking use included Cigarettes. He has never used smokeless tobacco. He reports that he drinks alcohol. He reports that he does not use illicit drugs.  REVIEW OF SYSTEMS:  Unable to obtain due to AMS  SUBJECTIVE:   VITAL SIGNS: Temp:  [96.7 F (35.9 C)] 96.7 F (35.9 C) (03/04 2025) Pulse Rate:  [51-61] 61 (03/04 2201) Resp:  [18-23] 18 (03/04 2201) BP: (96-158)/(54-73) 101/68 mmHg (03/04 2201) SpO2:  [93 %-99 %] 93 % (03/04 2201) Weight:  [  111 lb (50.349 kg)] 111 lb (50.349 kg) (03/04 1917) HEMODYNAMICS:   VENTILATOR SETTINGS:   INTAKE / OUTPUT: Intake/Output    None     PHYSICAL EXAMINATION: General:  Frail man in NAD Neuro:  Somnolent, but able to follow commands HEENT:  Blood dried around mouth Neck: No JVD, no LAD Cardiovascular:  Bradycardic rate Lungs:  CTAB Abdomen:  Mildly distended, nontender Musculoskeletal:  Not swollen Skin:  Icteric, with scattered ecchymosis.  LABS:  CBC  Recent Labs Lab 01/23/16 0509 01/24/16 0432 02/11/2016 2004  WBC 5.3 4.6 6.9  HGB 10.6* 10.6* 8.7*  HCT 31.7*  31.6* 26.3*  PLT 118* 106* 150   Coag's  Recent Labs Lab 01/22/16 0006 02/22/2016 2004  INR 1.19 1.24   BMET  Recent Labs Lab 01/23/16 0509 01/24/16 0432 02/04/2016 2004  NA 138 138 139  K 5.0 4.7 5.3*  CL 109 109 110  CO2 21* 22 19*  BUN 28* 33* 57*  CREATININE 1.79* 1.96* 1.81*  GLUCOSE 197* 119* 212*   Electrolytes  Recent Labs Lab 01/23/16 0509 01/24/16 0432 02/20/2016 2004  CALCIUM 8.5* 8.3* 7.7*   Sepsis Markers  Recent Labs Lab 01/26/2016 2142  LATICACIDVEN 4.7*   ABG No results for input(s): PHART, PCO2ART, PO2ART in the last 168 hours. Liver Enzymes  Recent Labs Lab 01/22/16 0006 02/05/2016 2004  AST 54* 68*  ALT 36 45  ALKPHOS 271* 266*  BILITOT 0.9 0.7  ALBUMIN 2.4* 1.6*   Cardiac Enzymes  Recent Labs Lab 01/22/16 0410 01/22/16 1021 01/22/16 1625  TROPONINI 0.04* 0.05* 0.04*   Glucose  Recent Labs Lab 01/23/16 0727 01/23/16 1152 01/23/16 1646 01/23/16 2114 01/24/16 0730 01/24/16 1131  GLUCAP 154* 189* 237* 102* 126* 173*    Imaging Dg Chest Portable 1 View  01/27/2016  CLINICAL DATA:  75 year old male with altered mental status EXAM: PORTABLE CHEST 1 VIEW COMPARISON:  Radiograph dated 01/22/2016 and chest CT dated 11/27/2013 FINDINGS: Single-view of the chest demonstrate focal area of linear and hazy density at the left lung base as seen on the prior new studies and likely represents scarring. Developing pneumonia is less likely but not excluded. Clinical correlation is recommended. There is no pleural effusion or pneumothorax. The cardiac silhouette is within normal limits. There is mild degenerative changes of the spine. No acute osseous pathology identified. IMPRESSION: Left lung base probable scarring. Developing pneumonia is less likely but not excluded. Clinical correlation is recommended. Electronically Signed   By: Anner Crete M.D.   On: 01/30/2016 19:51    EKG: Sinus Rhythm, occasionally bradycardic to 40s with  nausea. CXR: Clear  ASSESSMENT / PLAN:  Active Problems:   GI bleed   PULMONARY A: - No active Problems P:    CARDIOVASCULAR A: Bradycardia P:   Some component of vagal response as occurs mostly with vomiting, but will stop octreotide as this may be contributing.  RENAL A: AKI P:   Pt is hypovolemic, will bolus with IVF,   GASTROINTESTINAL A: - UGIB, likely variceal - ED discussed with GI, will eval in AM - No having active hematemesis or BRBPR presently, [facility] for now - Cirrhosis, slightly decompensated P:   Multiple 18 GA PIVs. Bleed likely had been going on for at least a few hours as BUN is elevated. Presently with normal BP. Hb > 7.0. Will give 2-3L of NS, recheck CBC.  HEMATOLOGIC A: - Thrombocytopenia P:   Due to cirrhosis Hold heparin / ASA due to active bleed  INFECTIOUS A: - CTX given GIB. P:   No clinical concern for add'l infxn  ENDOCRINE A: No active concerns P:    NEUROLOGIC A: AMS P:   Likely due to hepatic encephalopathy.   BEST PRACTICE / DISPOSITION Level of Care:  ICU Primary Service:  PCCM Consultants:  GI Code Status:  Full Diet:  NPO DVT Px:  None GI Px:  PPI gtt Skin Integrity:  Intact Social / Family:  Updated  TODAY'S SUMMARY: 75 y/o man with cirrhosis admitted with UGIB.  I have personally obtained a history, examined the patient, evaluated laboratory and imaging results, formulated the assessment and plan and placed orders.  CRITICAL CARE: The patient is critically ill with multiple organ systems failure and requires high complexity decision making for assessment and support, frequent evaluation and titration of therapies, application of advanced monitoring technologies and extensive interpretation of multiple databases. Critical Care Time devoted to patient care services described in this note is 90 minutes.   Luz Brazen, MD Pulmonary and [place] Pager: [phone]   02/06/2016, 10:58 PM

## 2016-01-28 NOTE — Progress Notes (Signed)
   02/14/2016 2100  Clinical Encounter Type  Visited With Family  Visit Type Initial;Social support  Referral From Physician  Spiritual Encounters  Spiritual Needs Emotional  Ch asked to check in with family in consult B; Family receptive and aware of [place] presence; family is [place] and that is primary language; [place] attempted to utilize and connect with translation service but access limited by phone range; currently pt granddaughters translating; [place] available if needed or requested for any additional support.  Gwynn Burly 9:48 PM

## 2016-01-28 NOTE — ED Provider Notes (Signed)
CSN: [id number]     Arrival date & time 01/25/2016  1910 History   First MD Initiated Contact with Patient 02/01/2016 1917     Chief Complaint  Patient presents with  . Altered Mental Status   Patient is a 75 y.o. male presenting with general illness. The history is provided by the EMS personnel and a relative. No language interpreter was used.  Illness Quality:  Hematemesis Severity:  Severe Onset quality:  Sudden Duration:  2 hours Timing:  Intermittent Progression:  Worsening Chronicity:  New Context:  History of esophageal varices Worsened by:  None  Level V exception due to patient's condition of nausea, vomiting, hematemesis, altered mental status  Past Medical History  Diagnosis Date  . Diabetes mellitus 2014  . HTN (hypertension)   . HLD (hyperlipidemia)   . Anginal pain ([place])   . Dysphagia   . Neuromuscular disorder (HCC)     muscle pain in arms  . Cirrhosis ([place]) 05/2012    Hep C Ab +, Hep B surface Ab and core Ab +, Hep B Surface Ag -  . Biliary acute pancreatitis 05/2012  . Coronary artery calcification 01/23/2016  . Calcification of aorta (HCC) 01/23/2016  . Pancreatitis 05/22/2012  . Stage III chronic kidney disease 01/23/2016  . Thrombocytopenia ([place]) 01/23/2016  . Normocytic anemia 01/23/2016  . 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 Old lacunar stroke without late effect 01/23/2016   Past Surgical History  Procedure Laterality Date  . Ercp  05/22/2012    Procedure: ENDOSCOPIC RETROGRADE CHOLANGIOPANCREATOGRAPHY (ERCP);  Surgeon: Beryle Beams, MD;  Location: [facility];  Service: Endoscopy;  Laterality: N/A;  . Esophagogastroduodenoscopy N/A 10/27/2015    Procedure: ESOPHAGOGASTRODUODENOSCOPY (EGD);  Surgeon: Gatha Mayer, MD;  Location[facility];  Service: Endoscopy;  Laterality: N/A;   No family history on file. Social History  Substance Use Topics  . Smoking status: Former Smoker    Types: Cigarettes  . Smokeless tobacco: Never  Used  . Alcohol Use: 0.0 oz/week    0 Standard drinks or equivalent per week     Comment: heavy ETOH consumption in past    Review of Systems  Unable to perform ROS: Mental status change    Allergies  Review of patient's allergies indicates no known allergies.  Home Medications   Prior to Admission medications   Medication Sig Start Date End Date Taking? Authorizing Provider  aspirin 81 MG chewable tablet Chew 1 tablet (81 mg total) by mouth daily. 01/24/16   Venetia Maxon Rama, MD  glipiZIDE (GLUCOTROL) 10 MG tablet Take 1 tablet (10 mg total) by mouth daily before breakfast. 01/24/16   Venetia Maxon Rama, MD  lactulose (CHRONULAC) 10 GM/15ML solution Take 30 ml TID to QID target is 2-4 soft, formed stools daily 01/27/16   Jerene Bears, MD  nadolol (CORGARD) 40 MG tablet Take 1 tablet (40 mg total) by mouth daily. 11/03/15   Lori P Hvozdovic, PA-C  simvastatin (ZOCOR) 40 MG tablet Take 1 tablet (40 mg total) by mouth daily. 01/24/16   Christina P Rama, MD   BP 101/68 mmHg  Pulse 61  Temp(Src) 96.7 F (35.9 C) (Rectal)  Resp 18  Wt 50.349 kg  SpO2 93% Physical Exam  Constitutional: No distress.  HENT:  Head: Normocephalic and atraumatic.  Right Ear: External ear normal.  Left Ear: External ear normal.  Eyes: Pupils are equal, round, and reactive to light. Right eye exhibits no discharge. Left eye exhibits no discharge.  Neck: Normal range of motion. No JVD present. No tracheal deviation present.  Cardiovascular: Normal rate, regular rhythm and normal heart sounds.  Exam reveals no friction rub.   No murmur heard. Pulmonary/Chest: Effort normal and breath sounds normal. No stridor. No respiratory distress. He has no wheezes.  Abdominal: Soft. Bowel sounds are normal. He exhibits distension. There is no rebound and no guarding.  Musculoskeletal: Normal range of motion. He exhibits no edema or tenderness.  Lymphadenopathy:    He has no cervical adenopathy.  Neurological: He is alert.  No cranial nerve deficit. Coordination normal.  Skin: Skin is warm and dry. No rash noted. No pallor.  Nursing note and vitals reviewed.   ED Course  Procedures (including critical care time) Labs Review Labs Reviewed  COMPREHENSIVE METABOLIC PANEL - Abnormal; Notable for the following:    Potassium 5.3 (*)    CO2 19 (*)    Glucose, Bld 212 (*)    BUN 57 (*)    Creatinine, Ser 1.81 (*)    Calcium 7.7 (*)    Total Protein 5.7 (*)    Albumin 1.6 (*)    AST 68 (*)    Alkaline Phosphatase 266 (*)    GFR calc non Af Amer 35 (*)    GFR calc Af Amer 40 (*)    All other components within normal limits  CBC WITH DIFFERENTIAL/PLATELET - Abnormal; Notable for the following:    RBC 2.78 (*)    Hemoglobin 8.7 (*)    HCT 26.3 (*)    RDW 16.0 (*)    All other components within normal limits  AMMONIA - Abnormal; Notable for the following:    Ammonia 66 (*)    All other components within normal limits  PROTIME-INR - Abnormal; Notable for the following:    Prothrombin Time 15.7 (*)    All other components within normal limits  LACTIC ACID, PLASMA - Abnormal; Notable for the following:    Lactic Acid, Venous 4.7 (*)    All other components within normal limits  URINE CULTURE  CULTURE, BLOOD (ROUTINE X 2)  CULTURE, BLOOD (ROUTINE X 2)  MRSA PCR SCREENING  URINALYSIS, ROUTINE W REFLEX MICROSCOPIC (NOT AT [facility])  LACTIC ACID, PLASMA  CBC  CBC  CBC  TYPE AND SCREEN    Imaging Review Dg Chest Portable 1 View  01/30/2016  CLINICAL DATA:  75 year old male with altered mental status EXAM: PORTABLE CHEST 1 VIEW COMPARISON:  Radiograph dated 01/22/2016 and chest CT dated 11/27/2013 FINDINGS: Single-view of the chest demonstrate focal area of linear and hazy density at the left lung base as seen on the prior new studies and likely represents scarring. Developing pneumonia is less likely but not excluded. Clinical correlation is recommended. There is no pleural effusion or pneumothorax. The  cardiac silhouette is within normal limits. There is mild degenerative changes of the spine. No acute osseous pathology identified. IMPRESSION: Left lung base probable scarring. Developing pneumonia is less likely but not excluded. Clinical correlation is recommended. Electronically Signed   By: Anner Crete M.D.   On: 02/13/2016 19:51   I have personally reviewed and evaluated these images and lab results as part of my medical decision-making.   EKG Interpretation   Date/Time:  Saturday January 28 2016 19:12:01 EST Ventricular Rate:  48 PR Interval:  191 QRS Duration: 128 QT Interval:  522 QTC Calculation: 466 R Axis:   -20 Text Interpretation:  Sinus bradycardia LVH with secondary repolarization  abnormality Probable anterior  infarct, age indeterminate Baseline wander  in lead(s) III Confirmed by ZACKOWSKI  MD, SCOTT [phone]) on 02/20/2016  7:26:50 PM      MDM   Final diagnoses:  Gastrointestinal hemorrhage, unspecified gastritis, unspecified gastrointestinal hemorrhage type   Patient presented via EMS for evaluation of hematemesis and altered mental status. Patient has a history of cirrhosis with known esophageal varices. Patient began vomiting blood this afternoon. He had blood coming out of his nose and was soaked in urine upon arrival. Patient with intermittent bradycardia and route.  Patient afebrile. Heart rate 50s upon arrival. Blood pressure stable. Patient had brief episode of nausea with hematemesis and resulting bradycardia. This improved without intervention.  I have concern for esophageal variceal bleeding due to history of known varices. I ordered normal saline bolus, Protonix IV, Zofran IV, Rocephin IV, octreotide IV.  I discussed case with [facility] GI physician on call.  Patient with recent attempted esophageal banding was unsuccessful [facility] GI physician felt that admission to critical care and IR consult for potential TIPS procedure was indicated.  Lactate elevated at  4.77, ammonia 66. Hemoglobin down to 8.7 from prior.  Patient remained hemodynamic stable on the emergency department was admitted to critical care team for further evaluation treatment of upper GI bleeding likely secondary to esophageal varices. The care team to speak with GI and possibly IR as indicated.  Discussed case with my attending, Dr. Rogene Houston.      Vira Blanco, MD 02/11/2016 [id number]  Fredia Sorrow, MD 02/15/2016 2312

## 2016-01-28 NOTE — Progress Notes (Signed)
CRITICAL VALUE ALERT  Critical value received:  Lactic acid 4.7  Date of notification:  02/02/2016   Time of notification:  2240  Critical value read back:Yes.    Nurse who received alert:  Ladoris Gene  MD notified (1st page):  Dr. Elsworth Soho  Time of first page:  2242   MD notified (2nd page): n/a  Time of second page: n/a  Responding MD:  Dr Elsworth Soho  Time MD responded:  2245

## 2016-01-28 NOTE — ED Notes (Signed)
Paramedics arrived at pt.s house and found pt. Sitting on the edge of his bed .  He was found with blood and urine on him.   He does not speak [place] and the family did not speak [place].  Pt. Is vomiting blood and becomes bradycardia. Pt. Also has bruising on his lt. Eye and bruises on his arms.  He has dried blood on the back of his head but no visible injury.  Pt. Is alert and is grunting

## 2016-01-29 LAB — ABO/RH: ABO/RH(D): A POS

## 2016-01-29 LAB — MRSA PCR SCREENING: MRSA by PCR: NEGATIVE

## 2016-01-30 ENCOUNTER — Telehealth: Payer: Self-pay

## 2016-01-30 LAB — GLUCOSE, CAPILLARY: GLUCOSE-CAPILLARY: 159 mg/dL — AB (ref 65–99)

## 2016-01-30 LAB — TYPE AND SCREEN
ABO/RH(D): A POS
ANTIBODY SCREEN: NEGATIVE

## 2016-01-30 MED FILL — Medication: Qty: 1 | Status: AC

## 2016-01-30 NOTE — Telephone Encounter (Signed)
On 01/30/2016 I received a death certificate from [facility]. The death certificate is for cremation. The patient is a patient of Doctor Luz Brazen but Doctor Nicoloff will be signing the death certificate. The death certificate will be taken to pulmonary unit this pm for signature. On 01/31/2016 I received a faxed copy back from [facility]. I faxed the copy to the funeral home. The original will still need to be signed once we the transportation details worked out. On 02/14/2016 I received the death certificate back from Doctor Audubon. I got the death certificate ready and called the funeral home to let them know the death certificate is ready for pickup.

## 2016-02-25 NOTE — Progress Notes (Signed)
   02/19/2016 2100  Clinical Encounter Type  Visited With Family  Visit Type Initial;Social support  Referral From Physician  Spiritual Encounters  Spiritual Needs Emotional  CH with family as pt transitioned; aided in collecting information regarding funeral home and [facility]. 12:23 AM

## 2016-02-25 NOTE — Progress Notes (Signed)
CDS notified. Eleonore Chiquito RN

## 2016-02-25 NOTE — Progress Notes (Signed)
At approximately 2342 pt had two runs of v-tach with a return to sinus bradycardia (50's). Called for help and the crash cart and zoll pads were applied. RT called due to unresponsiveness and labored respirations and Elink present via camera in. Dr. Jimmy Footman was made aware of the events and that we needed an MD stat. At approximately 2346 pt went into Vfib with MD Ines Bloomer in room. CPR started immediately and attempts at emergent intubation preformed. See code record for further details. MD stated that family had been asked about code status and stated they would prefer DNR if no chance of meaningful recovery. CPR ceased at 2355 and family notified and at bedside with MD. Eleonore Chiquito RN [place]

## 2016-02-25 NOTE — Progress Notes (Signed)
Dr. Elsworth Soho made aware of critical lactic acid of 4.7 as well as current respirations of 36 with an o2 sat of 93%. Also sought clairfication on orders. Informed that fellow saw pt in ED and should be writing more orders. Will continue to monitor closely.  Eleonore Chiquito RN [address] [place]

## 2016-02-25 NOTE — Progress Notes (Signed)
Pronounced dead 0010. Eleonore Chiquito RN [place] Peers RN

## 2016-02-25 NOTE — Progress Notes (Signed)
Family meeting note:  During my initial evaluation of Mr. Fredlund, I met with Mr. Klees family during the admission process (well before he decompensated). They had asked me pointedly early on if he was going to survive. I explained that while I thought that he was likely to survive this admission / event (the GIB), that his medium and long-term prognosis was poor. I asked about quality of life standards and they reported that he wanted to keep living if there were more "good days" to be had, but that if worsened to the point where treatments were unlikely to provide a reasonable quality of life, that efforts should be suspended. We discussed this at some length to further clarify, but the family made it clear that they trusted the physician judgement as to when care was futile to provide more "good days." I indicated that I thought it would be reasonable for the patient to be full code, particularly with his vagal bradycardia, as a bradycardic event leading to cardiac arrest may very well be reversed quickly. However, a prolonged code or major resuscitaiton would not be offered.  Luz Brazen, MD Pulmonary & Critical Care Medicine 02-19-16, 1:46 AM

## 2016-02-25 NOTE — Discharge Summary (Signed)
Event Note & Death Summary:  Please refer to H&P for full details. The patient was admitted and brought to [facility]. At around 11:45, I was notified that the patient was bradycardic with increased work of breathing. He was found to be hypoxic, unable to obtain a reliable pulse-ox. He was having agonal respirations. We prepared for intubation, but on checking for a pulse, one was not palpable. CPR was started with the suspected cause of a hypoxic event. After 2 minutes of CPR, (while bagging patient), rhythm was asystole. 1 mg of epinephrine was given. An attempt was made at intubation using video laryngoscopy. This was complicated by copious blood coming from the mouth, but a reasonable view of the epiglottis and arytenoids was obtained. The ETT was inserted with moderate difficulty, but appeared by pass above the arotinoids into the trachea, although the view was poor. After one bag through the ETT, the tube filled with blood, and placement could not be confirmed due to blood soiling the CO2 detector. At this point, given the prior discussion with the family, the code was called. The patient was in an agonal rhythm. While I went to discuss the situation with the family, the patient organized into a perfusing rhythm, likely indicating the presence of a clot in the trachea which may have been the cause of the patient's sudden decompensation. His rhythm quickly changed from organized back to bradycardic with a lot of ectopy, and he again arrest, but per family request and our decision of futility, further measures were not pursued. The patient expired shortly thereafter.  Luz Brazen, MD Pulmonary & Critical Care Medicine 02/04/2016, 1:55 AM

## 2016-02-25 DEATH — deceased

## 2016-10-30 IMAGING — CT CT ABD-PELV W/ CM
2 of 5 series · 15 of 46 positions shown, 17 images · IV contrast (Omni 300)
Comparison: Abdominal CT dated 05/28/2012 and chest CT dated
11/27/2013

CLINICAL DATA: 74-year-old male with abdominal distention x5 6
days. Diffuse abdominal pain.

EXAM:
CT ABDOMEN AND PELVIS WITH CONTRAST
TECHNIQUE: Multidetector CT imaging of the abdomen and pelvis was performed
using the standard protocol following bolus administration of
intravenous contrast.
CONTRAST:  80mL OMNIPAQUE IOHEXOL 300 MG/ML  SOLN

[Series 2: a/p w/ 5mm · axial · 0.73mm/px · z∈[-443,+7]mm · 12 of 103 slices shown, 14 images]
[im 7/103  soft-tissue]
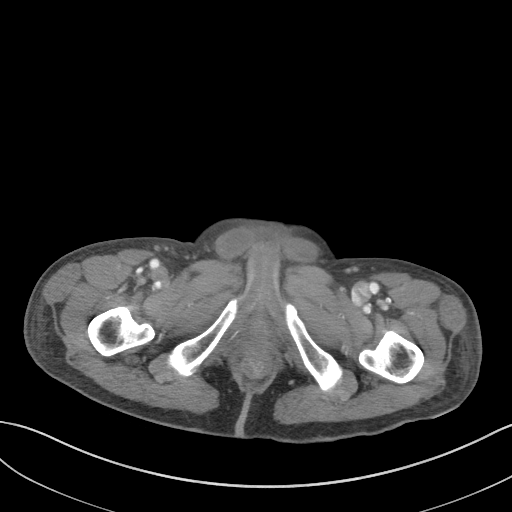
[im 7/103  bone]
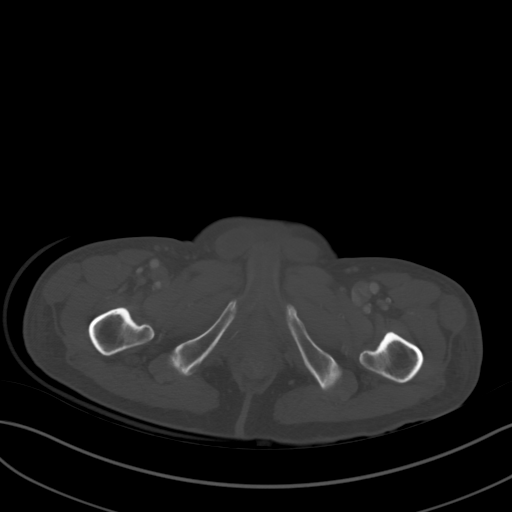
[im 19/103  soft-tissue]
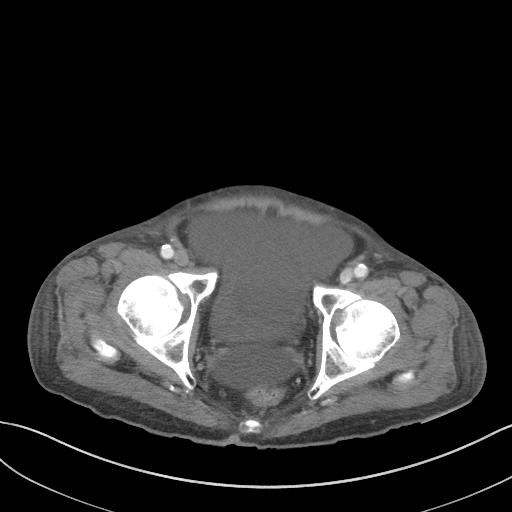
[im 25/103  soft-tissue]
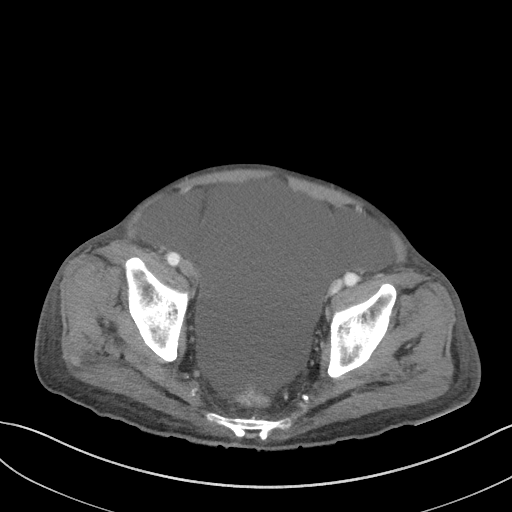
[im 31/103  soft-tissue]
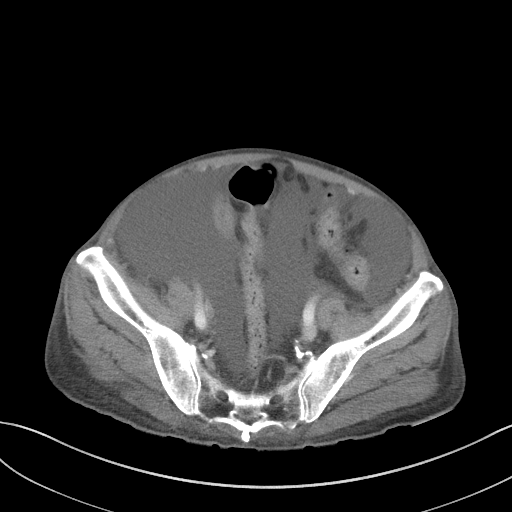
[im 43/103  soft-tissue]
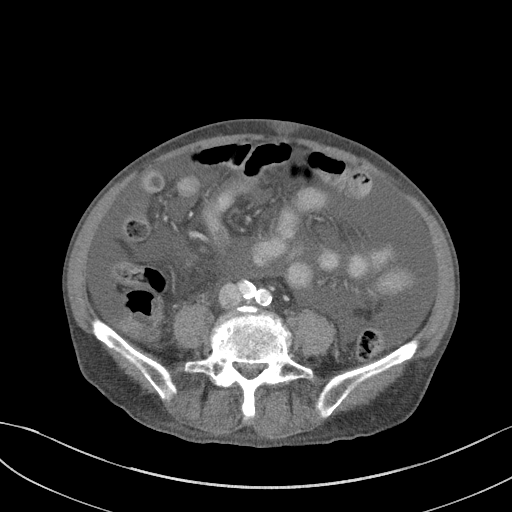
[im 49/103  soft-tissue]
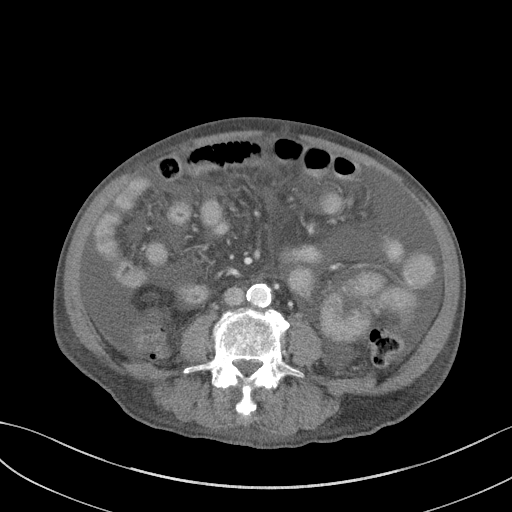
[im 55/103  soft-tissue]
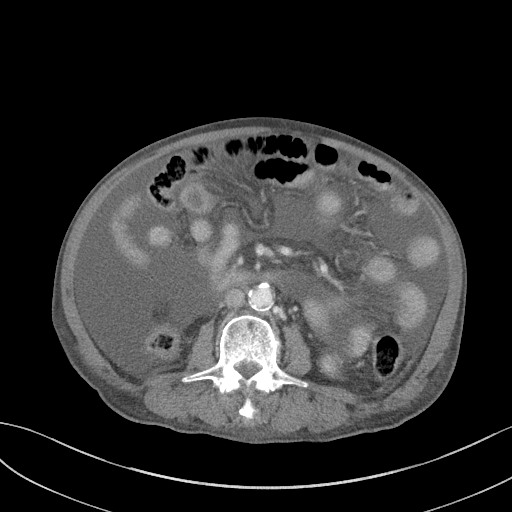
[im 67/103  soft-tissue]
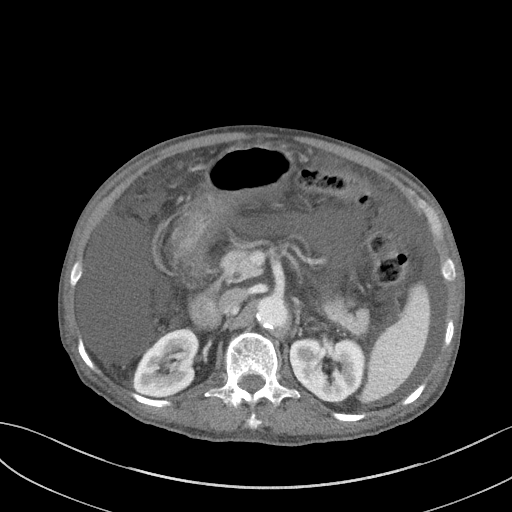
[im 73/103  soft-tissue]
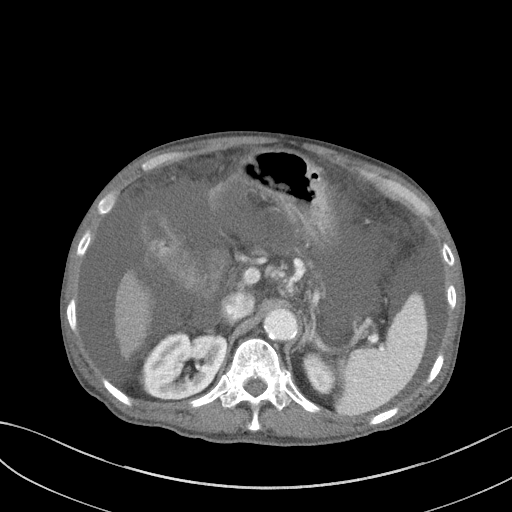
[im 73/103  bone]
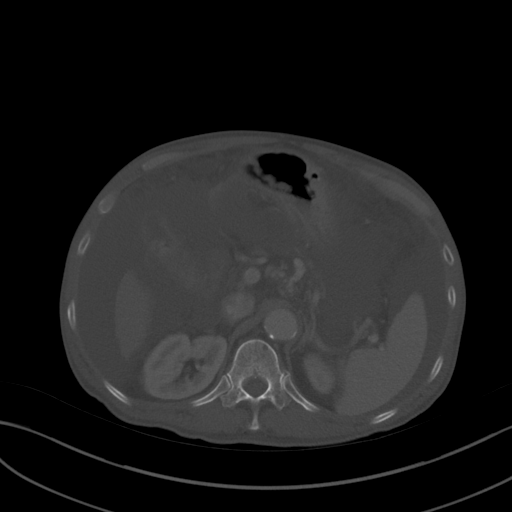
[im 79/103  soft-tissue]
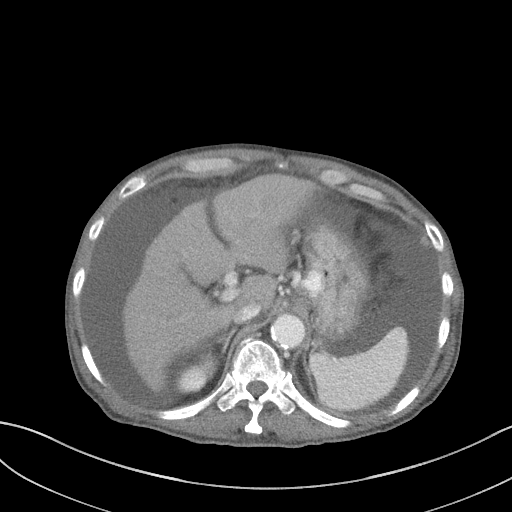
[im 91/103  soft-tissue]
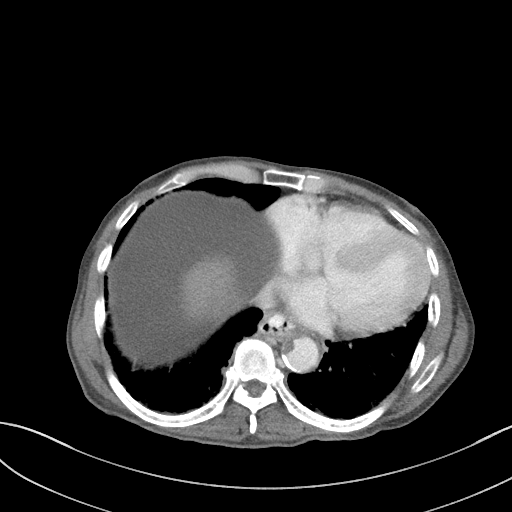
[im 97/103  soft-tissue]
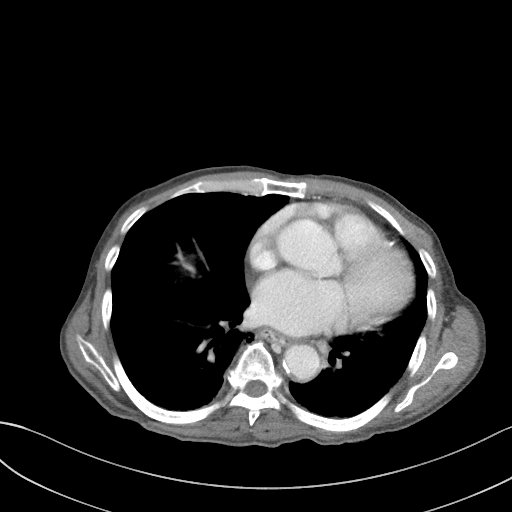

[Series 5: a/p w/ cor · coronal · 0.77mm/px · 3 of 123 slices shown]
[im 41/123  soft-tissue]
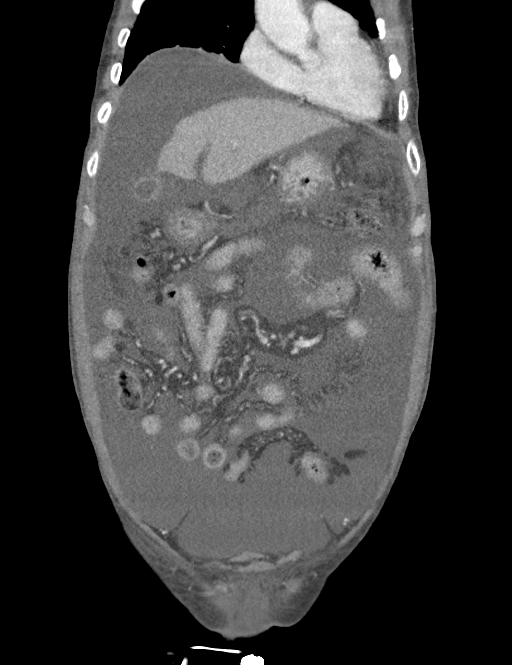
[im 55/123  soft-tissue]
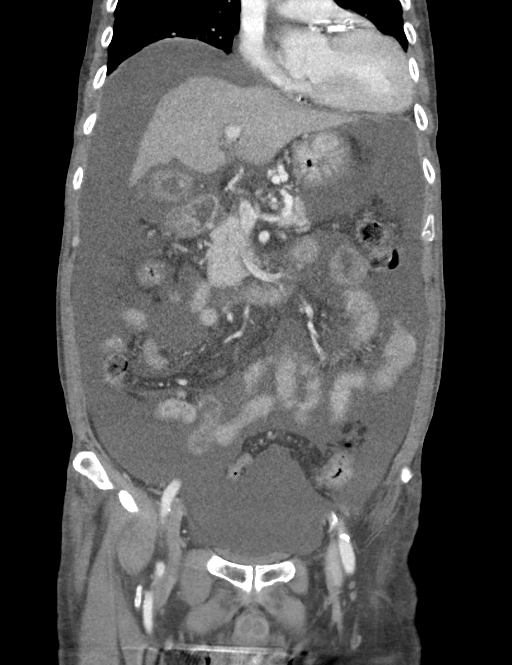
[im 68/123  soft-tissue]
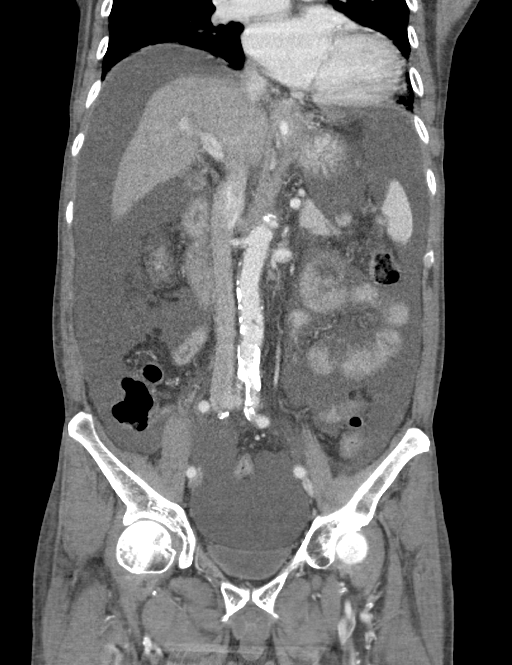

[15 of 46 positions shown; findings below may reference images not displayed]

FINDINGS: There bibasilar dependent atelectatic changes/scarring. Stable mild
cardiomegaly. There is coronary vascular calcification.

No intra-abdominal free air. Moderate diffuse ascites, new from
prior study.

There are morphologic changes of cirrhosis with surface irregularity
of the liver. Multiple gallstones. Evaluation of the gallbladder is
limited due to ascites. The pancreas, spleen, and adrenal glands
appear unremarkable. The kidneys, visualized ureters, and urinary
bladder appear unremarkable. The prostate gland and seminal vesicles
are grossly unremarkable.

There is a focal area of contrast collection at the gastroesophageal
junction extending into the distal esophagus compatible with
variceal bleed. There is no evidence of bowel obstruction. There is
apparent thickening of the ascending colon, likely related to
underdistention and secondary to hepatic colopathy. Colitis is less
likely but not excluded. Clinical correlation is recommended. The
appendix appears unremarkable.

Aortoiliac atherosclerotic disease. The origins of the celiac axis,
SMA, IMA as well as the origins of the renal arteries are patent. No
portal venous gas identified. Paraesophageal and gastric varices
noted. There is no lymphadenopathy. There is multilevel degenerative
changes of the spine. No acute fracture. There is diffuse
subcutaneous soft tissue stranding and anasarca.
IMPRESSION: Cirrhosis with evidence of portal hypertension and paraesophageal
and gastric versus. Contrast noted at the distal esophagus and
gastroesophageal junction compatible with variceal bleed.

Moderate ascites, new from prior study.

Apparent thickening of the ascending colon likely related to
cirrhosis and ascites and less likely colitis. Clinical correlation
is recommended. No bowel obstruction.

Cholelithiasis.

## 2016-12-07 IMAGING — US US PARACENTESIS
1 series · 6 of 6 positions shown · non-contrast
Comparison: None.

CLINICAL DATA: Abdominal discomfort, distention. Request for
diagnostic and therapeutic paracentesis of ascites, up to 4 L max.

EXAM:
ULTRASOUND GUIDED PARACENTESIS

[Series 1: us paracentesis · 0.26mm/px · 6 of 6 slices shown]
[im 1/6]
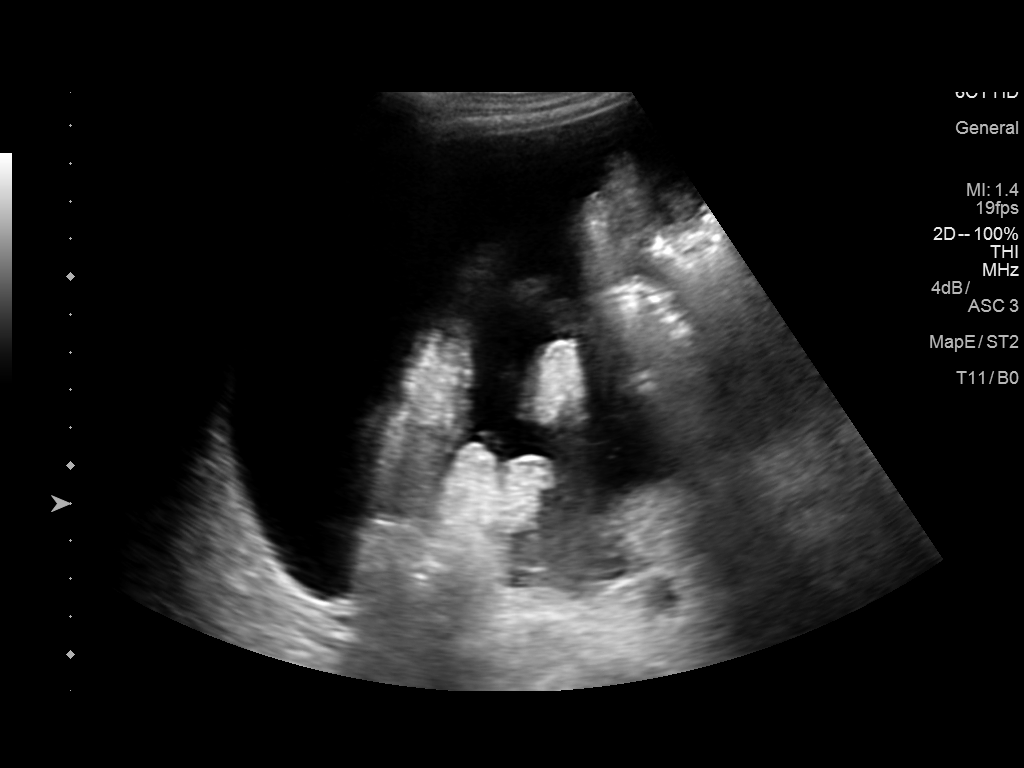
[im 2/6]
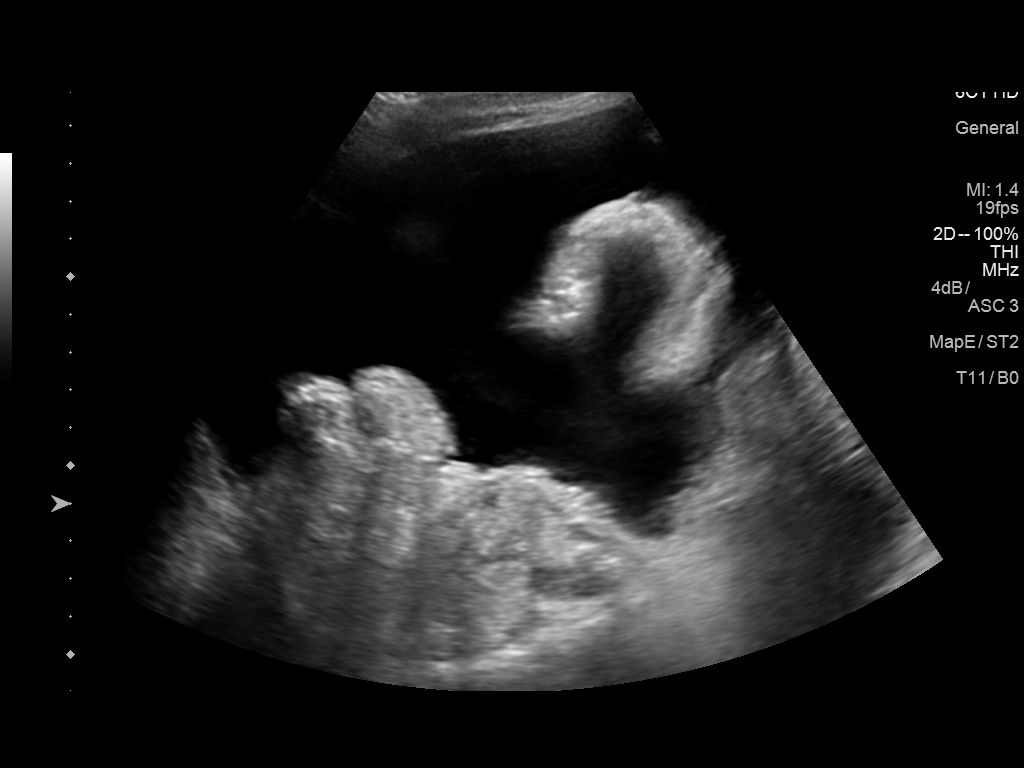
[im 3/6]
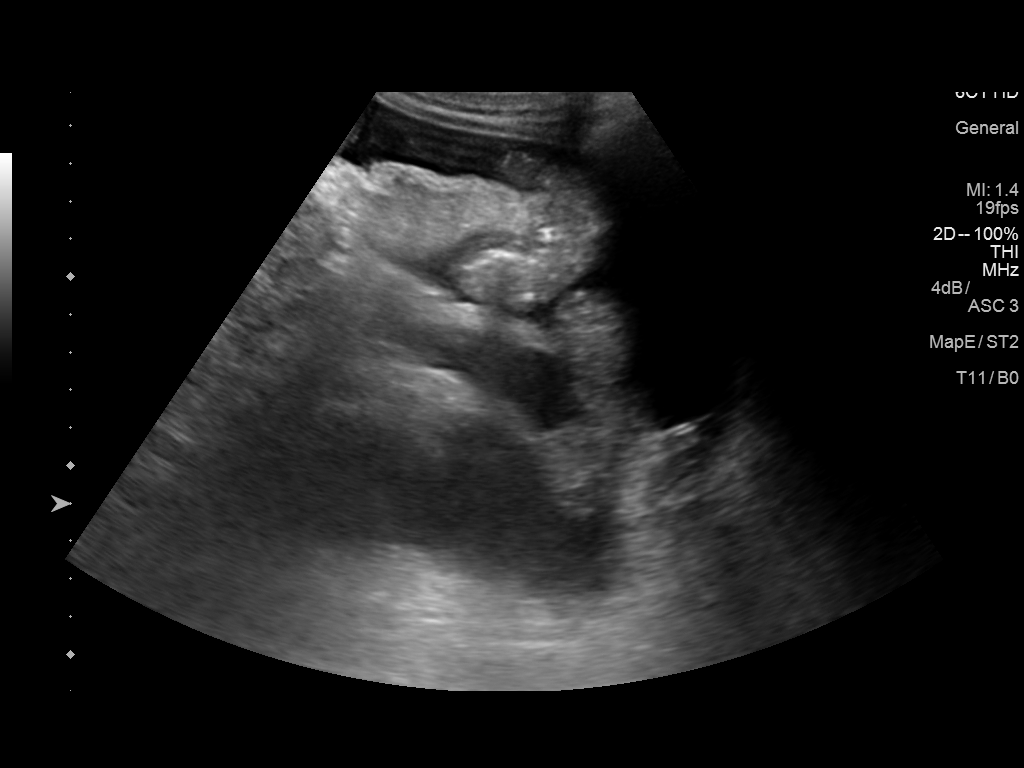
[im 4/6]
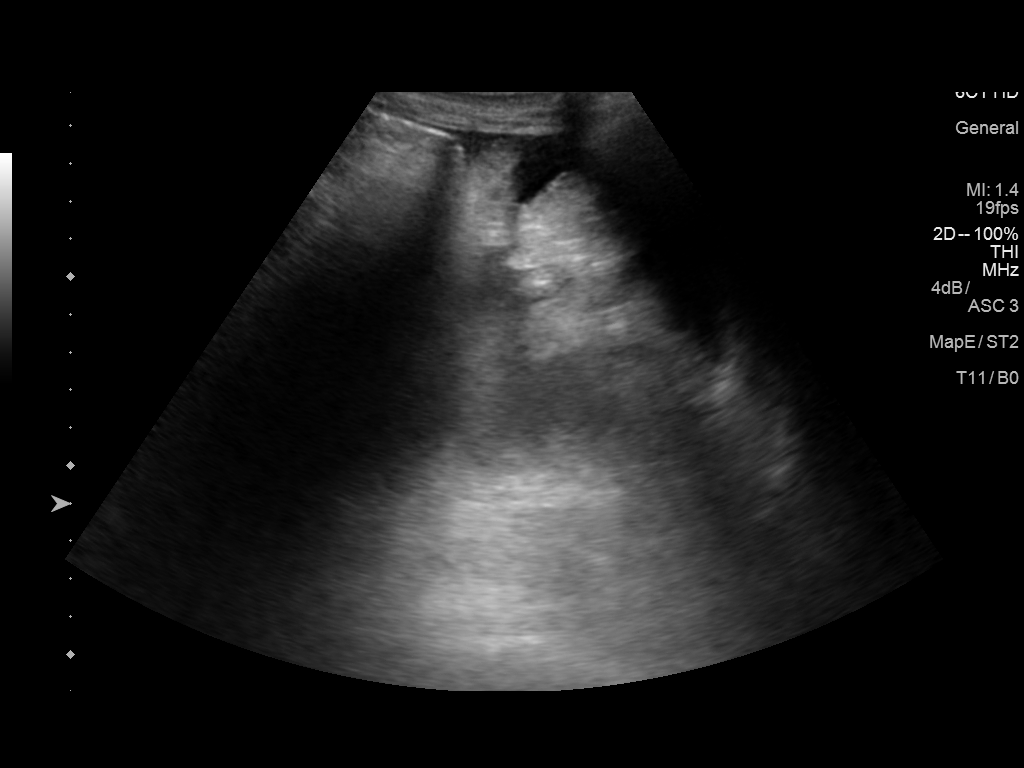
[im 5/6]
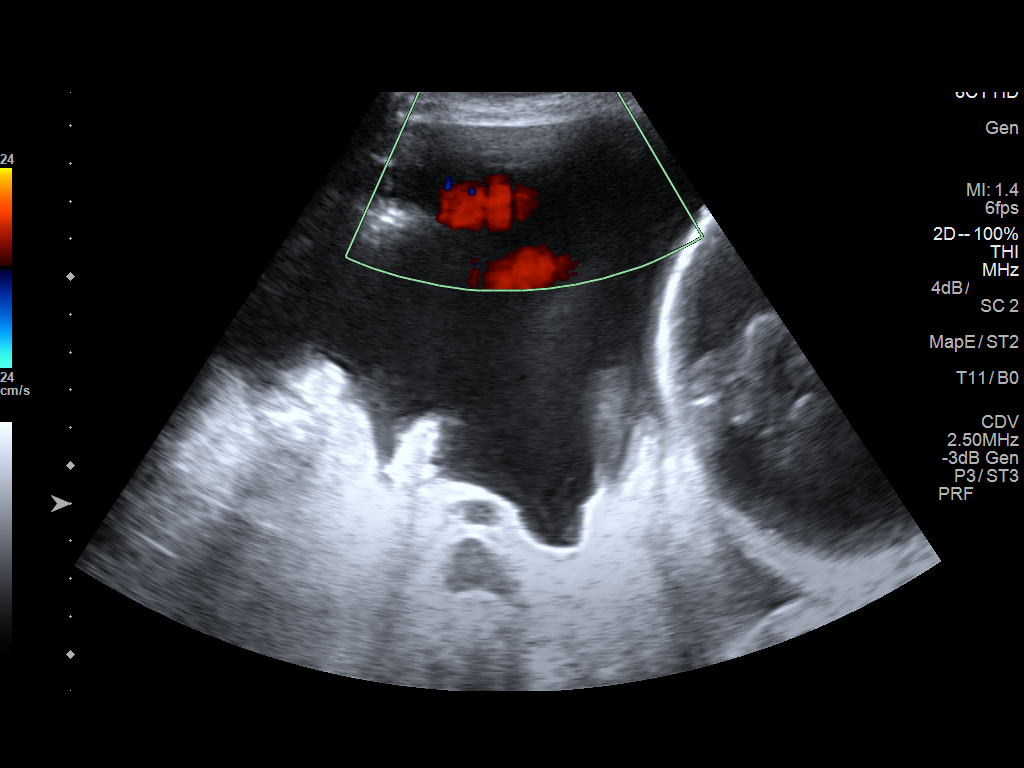
[im 6/6]
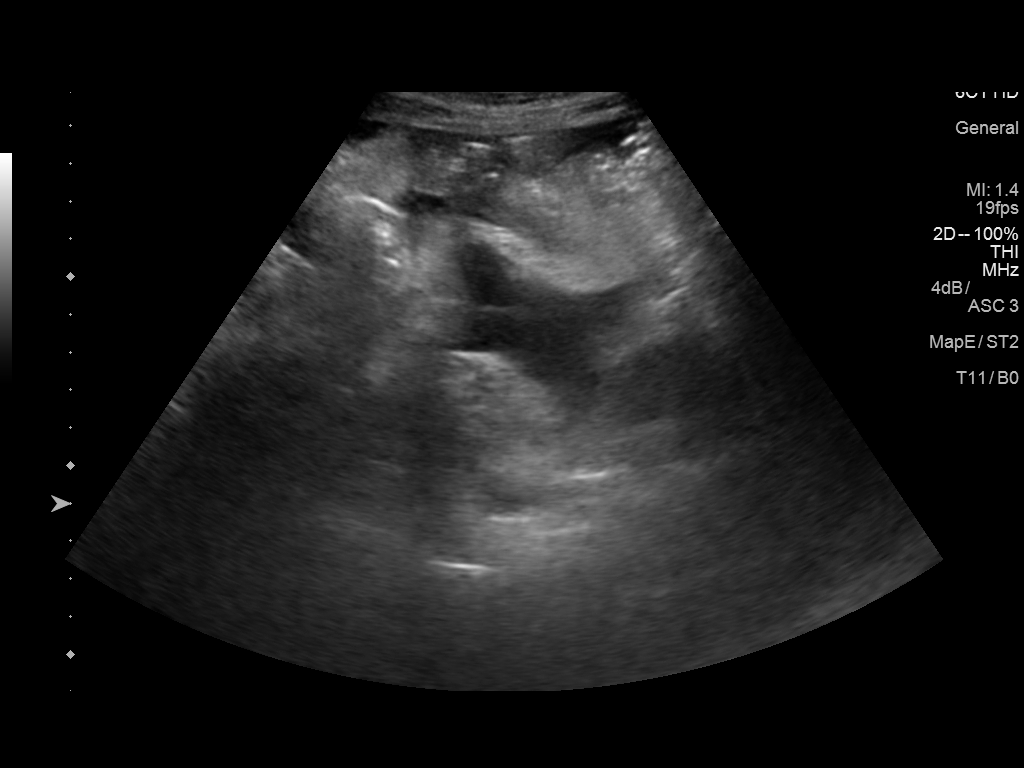

[6 of 6 positions shown; findings below may reference images not displayed]

PROCEDURE:
An ultrasound guided paracentesis was thoroughly discussed with the
patient and questions answered. The benefits, risks, alternatives
and complications were also discussed. The patient understands and
wishes to proceed with the procedure. Written consent was obtained.

Ultrasound was performed to localize and mark an adequate pocket of
fluid in the right lower quadrant of the abdomen. The area was then
prepped and draped in the normal sterile fashion. 1% Lidocaine was
used for local anesthesia. Under ultrasound guidance a 19 gauge Yueh
catheter was introduced. Paracentesis was performed. The catheter
was removed and a dressing applied.

COMPLICATIONS:
None immediate
FINDINGS: A total of approximately 4 L of clear yellow fluid was removed. A
fluid sample was sent for laboratory analysis.
IMPRESSION: Successful ultrasound guided paracentesis yielding 4 L of ascites.

## 2017-01-27 IMAGING — US US PARACENTESIS
1 series · 5 of 5 positions shown · non-contrast
Comparison: none

INDICATION: Hepatitis C, alcoholic cirrhosis, recurrent ascites

[Series 1: us paracentesis · 0.26mm/px · 5 of 5 slices shown]
[im 1/5]
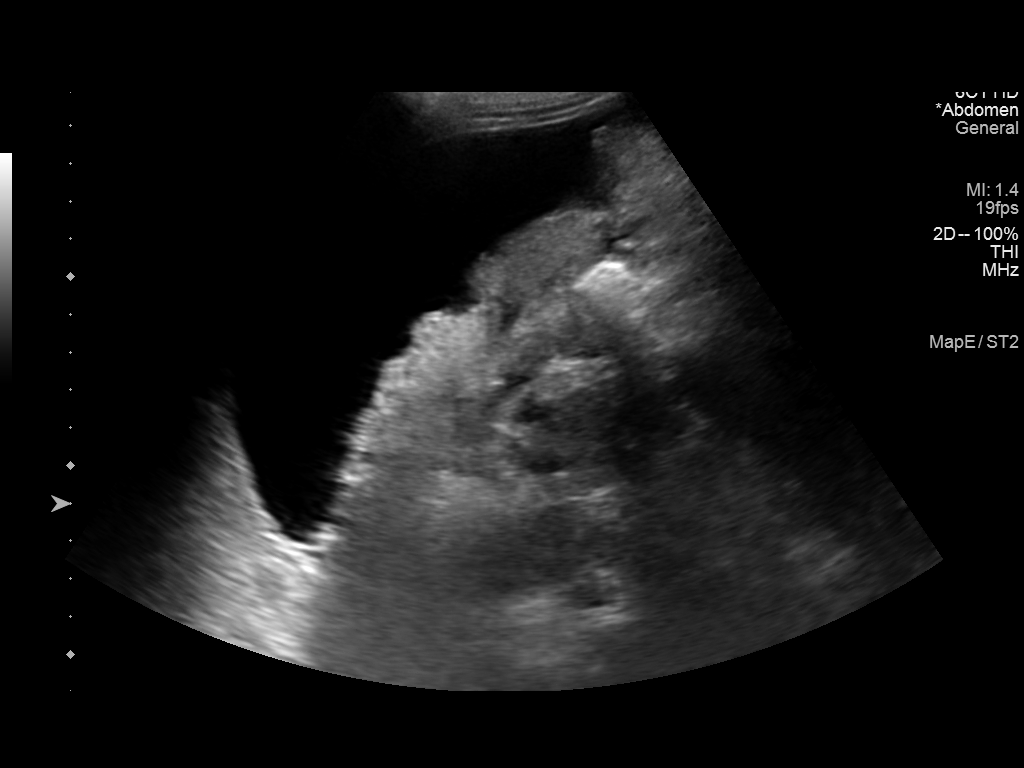
[im 2/5]
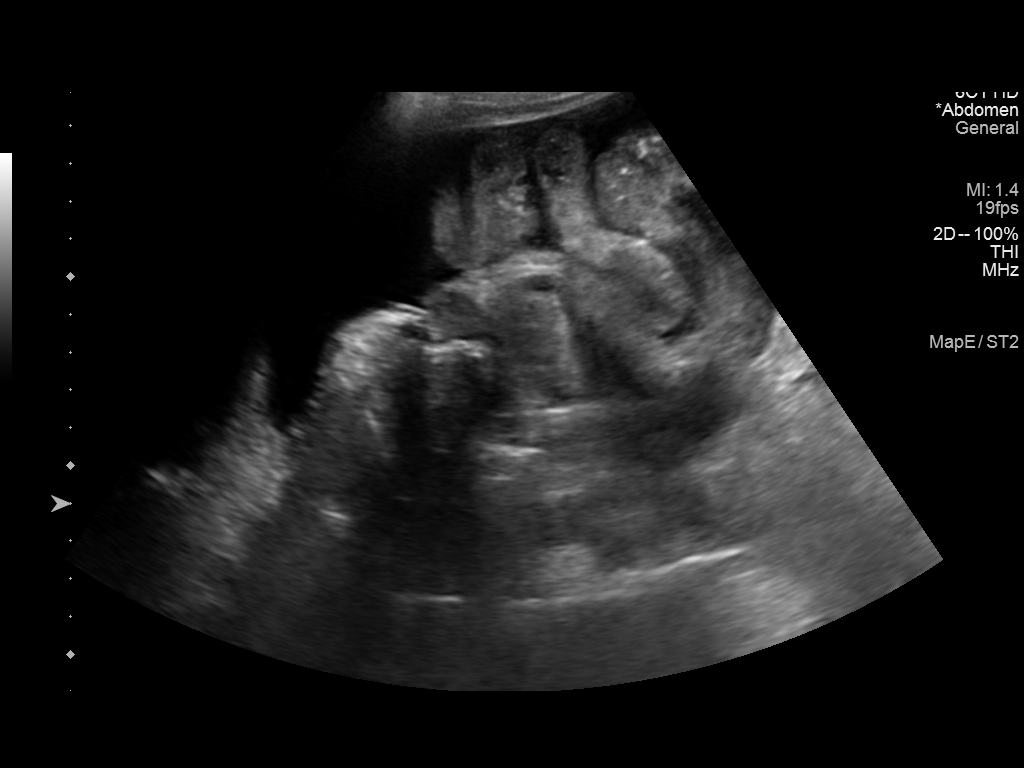
[im 3/5]
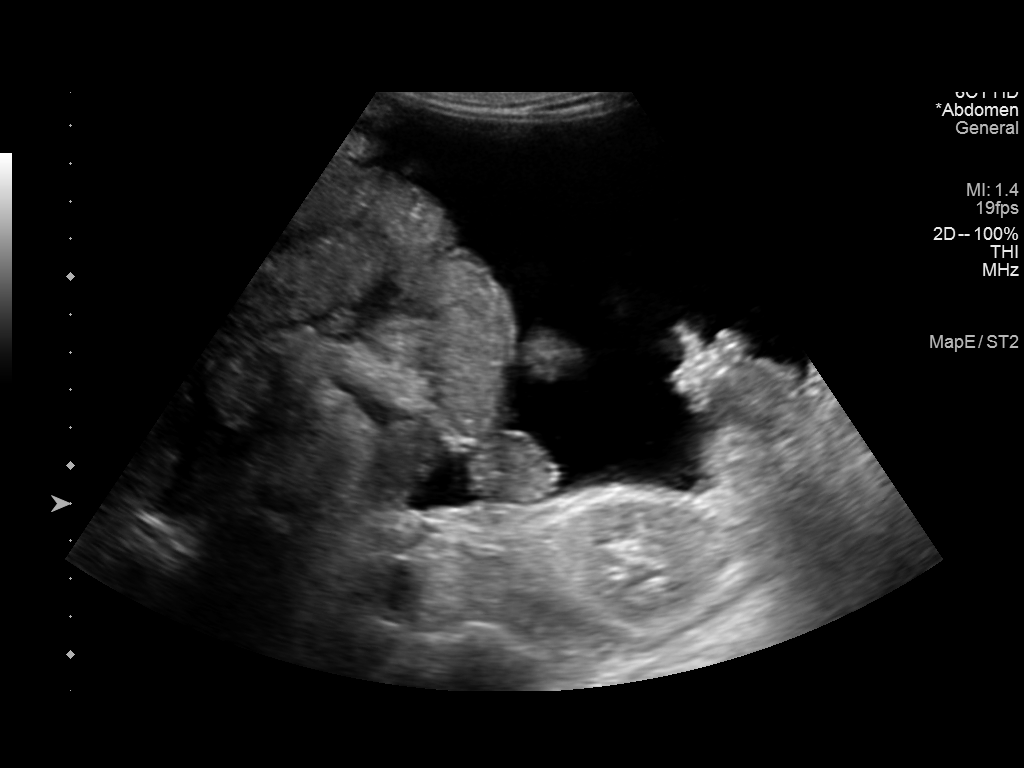
[im 4/5]
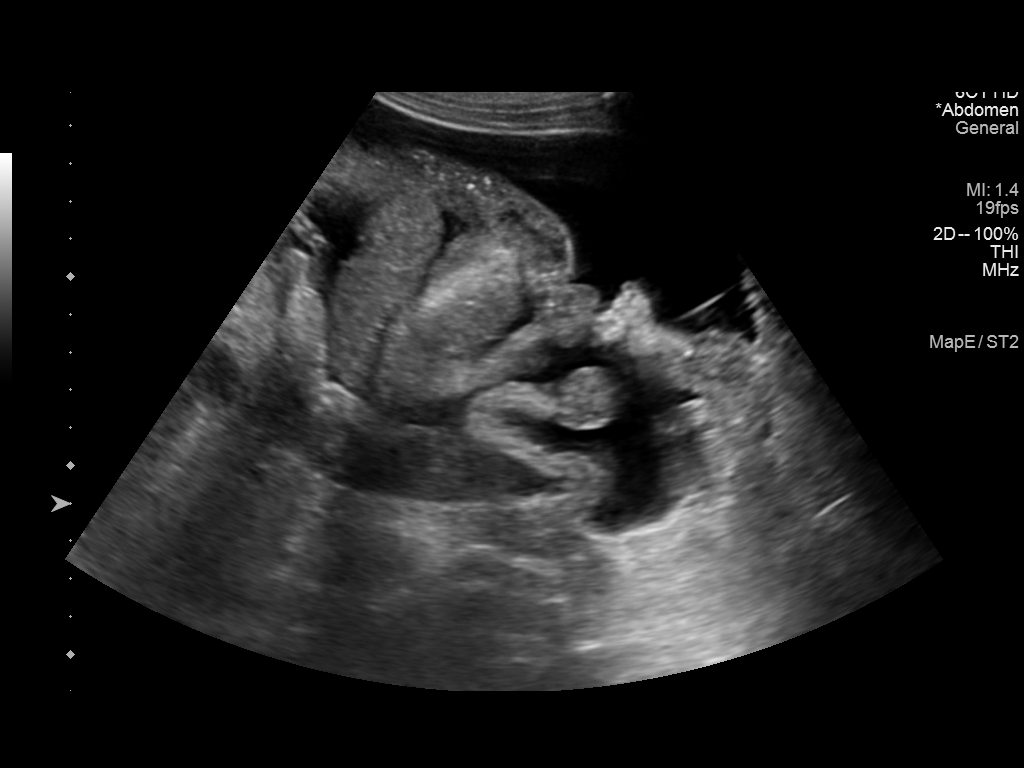
[im 5/5]
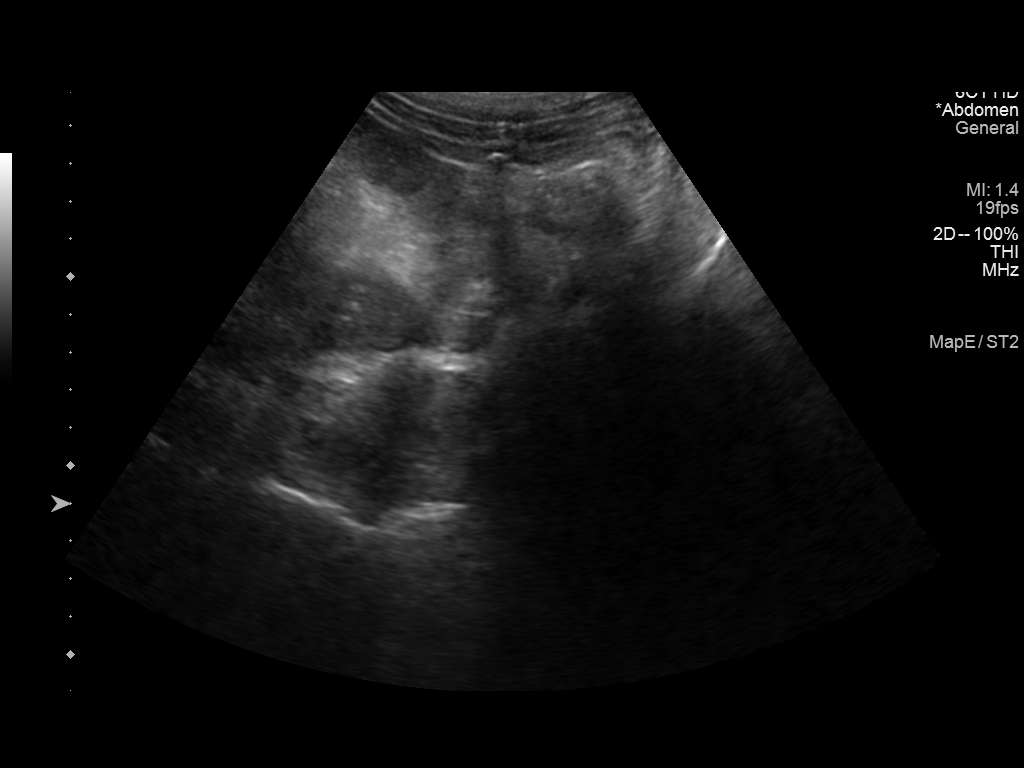

[5 of 5 positions shown; findings below may reference images not displayed]

EXAM:
ULTRASOUND GUIDED LEFT LOWER QUADRANT PARACENTESIS

MEDICATIONS:
None.

COMPLICATIONS:
None immediate.

PROCEDURE:
Informed written consent was obtained from the patient after a
discussion of the risks, benefits and alternatives to treatment. A
timeout was performed prior to the initiation of the procedure.

Initial ultrasound scanning demonstrates a large amount of ascites
within the right lower abdominal quadrant. The right lower abdomen
was prepped and draped in the usual sterile fashion. 1% lidocaine
with epinephrine was used for local anesthesia.

Following this, Ajinkya Strauss catheter was introduced. An
ultrasound image was saved for documentation purposes. The
paracentesis was performed. The catheter was removed and a dressing
was applied. The patient tolerated the procedure well without
immediate post procedural complication.
FINDINGS: A total of approximately 5.8L of clear yellow fluid was removed.
IMPRESSION: Successful ultrasound-guided paracentesis yielding 5.8 liters of
peritoneal fluid.

Read by Lanjun Mike
